# Patient Record
Sex: Female | Born: 1955
Health system: Southern US, Community
[De-identification: ages and names within clinical notes are randomized; demographics above are authoritative.]

## PROBLEM LIST (undated history)

## (undated) DIAGNOSIS — I10 Essential (primary) hypertension: Secondary | ICD-10-CM

## (undated) DIAGNOSIS — I2510 Atherosclerotic heart disease of native coronary artery without angina pectoris: Secondary | ICD-10-CM

## (undated) DIAGNOSIS — IMO0002 Reserved for concepts with insufficient information to code with codable children: Secondary | ICD-10-CM

## (undated) DIAGNOSIS — M797 Fibromyalgia: Secondary | ICD-10-CM

## (undated) DIAGNOSIS — F419 Anxiety disorder, unspecified: Secondary | ICD-10-CM

## (undated) DIAGNOSIS — D649 Anemia, unspecified: Secondary | ICD-10-CM

## (undated) DIAGNOSIS — K922 Gastrointestinal hemorrhage, unspecified: Secondary | ICD-10-CM

## (undated) DIAGNOSIS — R51 Headache: Secondary | ICD-10-CM

## (undated) DIAGNOSIS — R011 Cardiac murmur, unspecified: Secondary | ICD-10-CM

## (undated) DIAGNOSIS — M1711 Unilateral primary osteoarthritis, right knee: Secondary | ICD-10-CM

## (undated) HISTORY — DX: Reserved for concepts with insufficient information to code with codable children: IMO0002

## (undated) HISTORY — PX: TUBAL LIGATION: SHX77

## (undated) HISTORY — PX: ABDOMINOPLASTY: SUR9

## (undated) HISTORY — DX: Essential (primary) hypertension: I10

## (undated) HISTORY — PX: REDUCTION MAMMAPLASTY: SUR839

## (undated) HISTORY — DX: Headache: R51

---

## 1979-02-11 HISTORY — PX: CHOLECYSTECTOMY: SHX55

## 1979-02-11 HISTORY — PX: GALLBLADDER SURGERY: SHX652

## 1998-02-10 HISTORY — PX: CARDIAC SURGERY: SHX584

## 1998-10-30 ENCOUNTER — Encounter: Payer: Self-pay | Admitting: *Deleted

## 1998-10-30 ENCOUNTER — Ambulatory Visit (HOSPITAL_COMMUNITY): Admission: RE | Admit: 1998-10-30 | Discharge: 1998-10-30 | Payer: Self-pay | Admitting: *Deleted

## 1998-11-15 ENCOUNTER — Encounter: Payer: Self-pay | Admitting: Cardiothoracic Surgery

## 1998-11-19 ENCOUNTER — Encounter: Payer: Self-pay | Admitting: Cardiothoracic Surgery

## 1998-11-19 ENCOUNTER — Inpatient Hospital Stay (HOSPITAL_COMMUNITY): Admission: RE | Admit: 1998-11-19 | Discharge: 1998-11-23 | Payer: Self-pay | Admitting: Cardiothoracic Surgery

## 1998-11-19 HISTORY — PX: CARDIAC SURGERY: SHX584

## 1998-11-20 ENCOUNTER — Encounter: Payer: Self-pay | Admitting: Cardiothoracic Surgery

## 1998-11-21 ENCOUNTER — Encounter: Payer: Self-pay | Admitting: Cardiothoracic Surgery

## 1998-12-07 ENCOUNTER — Encounter: Admission: RE | Admit: 1998-12-07 | Discharge: 1998-12-07 | Payer: Self-pay | Admitting: Cardiothoracic Surgery

## 1998-12-07 ENCOUNTER — Encounter: Payer: Self-pay | Admitting: Cardiothoracic Surgery

## 1998-12-18 ENCOUNTER — Encounter (HOSPITAL_COMMUNITY): Admission: RE | Admit: 1998-12-18 | Discharge: 1999-03-18 | Payer: Self-pay | Admitting: Interventional Cardiology

## 1999-01-07 ENCOUNTER — Encounter: Payer: Self-pay | Admitting: Emergency Medicine

## 1999-01-07 ENCOUNTER — Inpatient Hospital Stay (HOSPITAL_COMMUNITY): Admission: EM | Admit: 1999-01-07 | Discharge: 1999-01-08 | Payer: Self-pay | Admitting: Emergency Medicine

## 1999-01-08 ENCOUNTER — Emergency Department (HOSPITAL_COMMUNITY): Admission: EM | Admit: 1999-01-08 | Discharge: 1999-01-08 | Payer: Self-pay | Admitting: *Deleted

## 2000-01-09 ENCOUNTER — Other Ambulatory Visit: Admission: RE | Admit: 2000-01-09 | Discharge: 2000-01-09 | Payer: Self-pay | Admitting: Family Medicine

## 2001-07-29 ENCOUNTER — Ambulatory Visit (HOSPITAL_COMMUNITY): Admission: RE | Admit: 2001-07-29 | Discharge: 2001-07-29 | Payer: Self-pay | Admitting: *Deleted

## 2002-02-10 HISTORY — PX: BLADDER SURGERY: SHX569

## 2002-12-08 ENCOUNTER — Encounter: Admission: RE | Admit: 2002-12-08 | Discharge: 2002-12-08 | Payer: Self-pay | Admitting: Family Medicine

## 2003-02-11 DIAGNOSIS — IMO0002 Reserved for concepts with insufficient information to code with codable children: Secondary | ICD-10-CM

## 2003-02-11 HISTORY — DX: Reserved for concepts with insufficient information to code with codable children: IMO0002

## 2003-07-27 ENCOUNTER — Other Ambulatory Visit: Admission: RE | Admit: 2003-07-27 | Discharge: 2003-07-27 | Payer: Self-pay | Admitting: Family Medicine

## 2003-10-31 ENCOUNTER — Encounter (INDEPENDENT_AMBULATORY_CARE_PROVIDER_SITE_OTHER): Payer: Self-pay | Admitting: *Deleted

## 2003-10-31 ENCOUNTER — Ambulatory Visit (HOSPITAL_COMMUNITY): Admission: RE | Admit: 2003-10-31 | Discharge: 2003-10-31 | Payer: Self-pay | Admitting: Urology

## 2004-01-29 ENCOUNTER — Other Ambulatory Visit: Admission: RE | Admit: 2004-01-29 | Discharge: 2004-01-29 | Payer: Self-pay | Admitting: Obstetrics and Gynecology

## 2004-03-27 ENCOUNTER — Ambulatory Visit (HOSPITAL_COMMUNITY): Admission: RE | Admit: 2004-03-27 | Discharge: 2004-03-27 | Payer: Self-pay | Admitting: Interventional Cardiology

## 2004-04-30 ENCOUNTER — Other Ambulatory Visit: Admission: RE | Admit: 2004-04-30 | Discharge: 2004-04-30 | Payer: Self-pay | Admitting: Obstetrics and Gynecology

## 2004-07-15 ENCOUNTER — Other Ambulatory Visit: Admission: RE | Admit: 2004-07-15 | Discharge: 2004-07-15 | Payer: Self-pay | Admitting: Obstetrics and Gynecology

## 2004-10-07 ENCOUNTER — Other Ambulatory Visit: Admission: RE | Admit: 2004-10-07 | Discharge: 2004-10-07 | Payer: Self-pay | Admitting: Obstetrics and Gynecology

## 2004-12-27 ENCOUNTER — Encounter (INDEPENDENT_AMBULATORY_CARE_PROVIDER_SITE_OTHER): Payer: Self-pay | Admitting: Specialist

## 2004-12-27 ENCOUNTER — Ambulatory Visit (HOSPITAL_COMMUNITY): Admission: RE | Admit: 2004-12-27 | Discharge: 2004-12-27 | Payer: Self-pay | Admitting: Obstetrics and Gynecology

## 2005-01-13 ENCOUNTER — Other Ambulatory Visit: Admission: RE | Admit: 2005-01-13 | Discharge: 2005-01-13 | Payer: Self-pay | Admitting: Obstetrics and Gynecology

## 2005-03-09 ENCOUNTER — Encounter: Admission: RE | Admit: 2005-03-09 | Discharge: 2005-03-09 | Payer: Self-pay | Admitting: Orthopaedic Surgery

## 2005-06-19 ENCOUNTER — Other Ambulatory Visit: Admission: RE | Admit: 2005-06-19 | Discharge: 2005-06-19 | Payer: Self-pay | Admitting: Obstetrics and Gynecology

## 2005-06-19 DIAGNOSIS — R87619 Unspecified abnormal cytological findings in specimens from cervix uteri: Secondary | ICD-10-CM

## 2005-06-19 DIAGNOSIS — IMO0002 Reserved for concepts with insufficient information to code with codable children: Secondary | ICD-10-CM

## 2005-06-19 HISTORY — DX: Unspecified abnormal cytological findings in specimens from cervix uteri: R87.619

## 2005-06-19 HISTORY — DX: Reserved for concepts with insufficient information to code with codable children: IMO0002

## 2005-07-18 ENCOUNTER — Encounter (INDEPENDENT_AMBULATORY_CARE_PROVIDER_SITE_OTHER): Payer: Self-pay | Admitting: Specialist

## 2005-07-18 ENCOUNTER — Ambulatory Visit (HOSPITAL_COMMUNITY): Admission: RE | Admit: 2005-07-18 | Discharge: 2005-07-18 | Payer: Self-pay | Admitting: Obstetrics and Gynecology

## 2005-07-18 HISTORY — PX: NOVASURE ABLATION: SHX5394

## 2005-10-21 ENCOUNTER — Other Ambulatory Visit: Admission: RE | Admit: 2005-10-21 | Discharge: 2005-10-21 | Payer: Self-pay | Admitting: Obstetrics and Gynecology

## 2006-10-16 ENCOUNTER — Encounter: Admission: RE | Admit: 2006-10-16 | Discharge: 2006-10-16 | Payer: Self-pay | Admitting: Family Medicine

## 2007-01-27 ENCOUNTER — Emergency Department (HOSPITAL_COMMUNITY): Admission: EM | Admit: 2007-01-27 | Discharge: 2007-01-27 | Payer: Self-pay | Admitting: Emergency Medicine

## 2007-02-17 ENCOUNTER — Encounter: Admission: RE | Admit: 2007-02-17 | Discharge: 2007-02-17 | Payer: Self-pay | Admitting: Obstetrics and Gynecology

## 2007-12-13 ENCOUNTER — Encounter: Admission: RE | Admit: 2007-12-13 | Discharge: 2007-12-13 | Payer: Self-pay | Admitting: Obstetrics and Gynecology

## 2008-07-20 ENCOUNTER — Encounter: Admission: RE | Admit: 2008-07-20 | Discharge: 2008-07-20 | Payer: Self-pay | Admitting: Obstetrics and Gynecology

## 2009-02-13 ENCOUNTER — Ambulatory Visit (HOSPITAL_BASED_OUTPATIENT_CLINIC_OR_DEPARTMENT_OTHER): Admission: RE | Admit: 2009-02-13 | Discharge: 2009-02-13 | Payer: Self-pay | Admitting: Surgery

## 2009-07-17 ENCOUNTER — Encounter: Admission: RE | Admit: 2009-07-17 | Discharge: 2009-07-17 | Payer: Self-pay | Admitting: Obstetrics and Gynecology

## 2009-11-18 ENCOUNTER — Emergency Department (HOSPITAL_COMMUNITY): Admission: EM | Admit: 2009-11-18 | Discharge: 2009-11-18 | Payer: Self-pay | Admitting: Emergency Medicine

## 2009-11-18 ENCOUNTER — Emergency Department (HOSPITAL_COMMUNITY): Admission: EM | Admit: 2009-11-18 | Discharge: 2009-11-19 | Payer: Self-pay | Admitting: Emergency Medicine

## 2009-12-10 ENCOUNTER — Encounter: Admission: RE | Admit: 2009-12-10 | Discharge: 2009-12-10 | Payer: Self-pay | Admitting: Gastroenterology

## 2009-12-11 ENCOUNTER — Encounter: Admission: RE | Admit: 2009-12-11 | Discharge: 2009-12-11 | Payer: Self-pay | Admitting: Gastroenterology

## 2009-12-14 ENCOUNTER — Encounter: Admission: RE | Admit: 2009-12-14 | Discharge: 2009-12-14 | Payer: Self-pay | Admitting: Gastroenterology

## 2010-04-25 LAB — POCT CARDIAC MARKERS: Troponin i, poc: <0.05 ng/mL (ref 0.00–0.09)

## 2010-04-25 LAB — HEPATIC FUNCTION PANEL
ALT: 25 U/L (ref 0–35)
AST: 27 U/L (ref 0–37)
Albumin: 3.8 g/dL (ref 3.5–5.2)
Alkaline Phosphatase: 66 U/L (ref 39–117)
Total Bilirubin: 0.7 mg/dL (ref 0.3–1.2)

## 2010-04-25 LAB — POCT I-STAT, CHEM 8
BUN: 12 mg/dL (ref 6–23)
Creatinine, Ser: 0.8 mg/dL (ref 0.4–1.2)
Potassium: 4 mEq/L (ref 3.5–5.1)
Sodium: 138 mEq/L (ref 135–145)

## 2010-04-25 LAB — CBC
Hemoglobin: 14.5 g/dL (ref 12.0–15.0)
MCV: 86.8 fL (ref 78.0–100.0)
Platelets: 228 10*3/uL (ref 150–400)
RBC: 4.79 MIL/uL (ref 3.87–5.11)
WBC: 11.8 10*3/uL — ABNORMAL HIGH (ref 4.0–10.5)

## 2010-04-25 LAB — DIFFERENTIAL
Lymphocytes Relative: 33 % (ref 12–46)
Lymphs Abs: 3.9 10*3/uL (ref 0.7–4.0)
Neutrophils Relative %: 59 % (ref 43–77)

## 2010-04-28 LAB — POCT HEMOGLOBIN-HEMACUE: Hemoglobin: 14.9 g/dL (ref 12.0–15.0)

## 2010-05-08 ENCOUNTER — Ambulatory Visit: Payer: 59 | Attending: Family Medicine | Admitting: Physical Therapy

## 2010-05-08 DIAGNOSIS — IMO0001 Reserved for inherently not codable concepts without codable children: Secondary | ICD-10-CM | POA: Insufficient documentation

## 2010-05-08 DIAGNOSIS — M25559 Pain in unspecified hip: Secondary | ICD-10-CM | POA: Insufficient documentation

## 2010-05-08 DIAGNOSIS — R262 Difficulty in walking, not elsewhere classified: Secondary | ICD-10-CM | POA: Insufficient documentation

## 2010-05-09 ENCOUNTER — Ambulatory Visit: Payer: 59 | Admitting: Physical Therapy

## 2010-05-13 ENCOUNTER — Ambulatory Visit: Payer: 59 | Attending: Family Medicine | Admitting: Physical Therapy

## 2010-05-13 DIAGNOSIS — IMO0001 Reserved for inherently not codable concepts without codable children: Secondary | ICD-10-CM | POA: Insufficient documentation

## 2010-05-13 DIAGNOSIS — M25559 Pain in unspecified hip: Secondary | ICD-10-CM | POA: Insufficient documentation

## 2010-05-13 DIAGNOSIS — R262 Difficulty in walking, not elsewhere classified: Secondary | ICD-10-CM | POA: Insufficient documentation

## 2010-05-15 ENCOUNTER — Ambulatory Visit: Payer: 59

## 2010-05-21 ENCOUNTER — Ambulatory Visit: Payer: 59 | Admitting: Physical Therapy

## 2010-05-23 ENCOUNTER — Encounter: Payer: 59 | Admitting: Physical Therapy

## 2010-05-23 ENCOUNTER — Ambulatory Visit: Payer: 59 | Admitting: Physical Therapy

## 2010-05-29 ENCOUNTER — Ambulatory Visit: Payer: 59 | Admitting: Physical Therapy

## 2010-06-03 ENCOUNTER — Ambulatory Visit: Payer: 59 | Admitting: Physical Therapy

## 2010-06-05 ENCOUNTER — Encounter: Payer: 59 | Admitting: Physical Therapy

## 2010-06-07 ENCOUNTER — Encounter (HOSPITAL_BASED_OUTPATIENT_CLINIC_OR_DEPARTMENT_OTHER)
Admission: RE | Admit: 2010-06-07 | Discharge: 2010-06-07 | Disposition: A | Discharge status: Home / Self Care | Payer: 59 | Source: Ambulatory Visit | Attending: Surgery | Admitting: Surgery

## 2010-06-07 LAB — CBC
HCT: 41.6 % (ref 36.0–46.0)
Hemoglobin: 14.4 g/dL (ref 12.0–15.0)
RDW: 13.2 % (ref 11.5–15.5)
WBC: 7.4 10*3/uL (ref 4.0–10.5)

## 2010-06-07 LAB — URINALYSIS, ROUTINE W REFLEX MICROSCOPIC
Ketones, ur: NEGATIVE mg/dL
Nitrite: NEGATIVE
Urobilinogen, UA: 0.2 mg/dL (ref 0.0–1.0)
pH: 5.5 (ref 5.0–8.0)

## 2010-06-07 LAB — DIFFERENTIAL
Basophils Absolute: 0 10*3/uL (ref 0.0–0.1)
Eosinophils Relative: 3 % (ref 0–5)
Lymphocytes Relative: 33 % (ref 12–46)
Neutro Abs: 4.3 10*3/uL (ref 1.7–7.7)

## 2010-06-07 LAB — PROTIME-INR: INR: 0.89 (ref 0.00–1.49)

## 2010-06-07 LAB — BASIC METABOLIC PANEL
CO2: 27 mEq/L (ref 19–32)
Calcium: 9.2 mg/dL (ref 8.4–10.5)
Creatinine, Ser: 0.71 mg/dL (ref 0.4–1.2)
Glucose, Bld: 105 mg/dL — ABNORMAL HIGH (ref 70–99)

## 2010-06-07 LAB — URINE MICROSCOPIC-ADD ON

## 2010-06-11 ENCOUNTER — Ambulatory Visit (HOSPITAL_BASED_OUTPATIENT_CLINIC_OR_DEPARTMENT_OTHER)
Admission: RE | Admit: 2010-06-11 | Discharge: 2010-06-11 | Disposition: A | Discharge status: Home / Self Care | Payer: 59 | Source: Ambulatory Visit | Attending: Surgery | Admitting: Surgery

## 2010-06-11 ENCOUNTER — Other Ambulatory Visit: Payer: Self-pay | Admitting: Surgery

## 2010-06-11 DIAGNOSIS — Z01812 Encounter for preprocedural laboratory examination: Secondary | ICD-10-CM | POA: Insufficient documentation

## 2010-06-11 DIAGNOSIS — L732 Hidradenitis suppurativa: Secondary | ICD-10-CM | POA: Insufficient documentation

## 2010-06-11 HISTORY — PX: CYSTECTOMY: SUR359

## 2010-06-15 NOTE — Op Note (Signed)
  NAME:  Sierra Lucas, Sierra Lucas NO.:  [id number]  MEDICAL RECORD NO.:  [id number]           PATIENT TYPE:  LOCATION:                                 FACILITY:  PHYSICIAN:  Velora Heckler, MD      DATE OF BIRTH:  06/08/1955  DATE OF PROCEDURE:  06/11/2010                               OPERATIVE REPORT   PREOPERATIVE DIAGNOSIS:  Hidradenitis, left medial buttock.  POSTOPERATIVE DIAGNOSIS:  Hidradenitis, left medial buttock.  PROCEDURE:  Excision, hidradenitis, left buttock (2.0 x 2.0 cm).  SURGEON:  Velora Heckler, MD, FACS  ANESTHESIA:  General.  ESTIMATED BLOOD LOSS:  Minimal.  PREPARATION:  Betadine.  COMPLICATIONS:  None.  INDICATIONS:  The patient is a 55 year old white female who had previous excision of hidradenitis from the medial left buttock in January 2011. This initially healed by secondary intention.  The patient was symptom free for approximately 1 year.  Over the past 2 months, she has developed recurrent drainage from the site.  She now comes to the operating room for excision.  BODY OF REPORT:  Procedure was done in OR #3 at the [facility].  The patient was brought to the operating room and placed in a supine position on the operating room table.  Following administration of general anesthesia, the patient was placed on lithotomy and then prepped and draped in the usual aseptic fashion.  After ascertaining that an adequate level of anesthesia had been achieved, the small sinus tract in the left medial buttock was probed with a blunt-tipped probe. It tracked only approximately 5-7 mm.  This did not appear to be a fistula in ano.  Using the electrocautery for hemostasis, an elliptical incision was made so as to encompass the old scar and sinus tract.  Dissection was carried into the subcutaneous tissues.  No purulent fluid or abscess cavity was identified.  Specimen was completely excised.  It measured approximately 2 x 2 cm.  It  was cut open on the back table and both aerobic and anaerobic cultures were obtained from the tissue and submitted to the laboratory for review.  The entire specimen was submitted to Pathology.  Wound was cleansed with Betadine.  Subcutaneous tissues were reapproximated with interrupted 3-0 Vicryl sutures.  Skin was closed with stainless steel staples.  Topical antibiotic ointment and dry gauze dressings were applied.  The patient was awakened from anesthesia and brought to the recovery room.  The patient tolerated the procedure well.   Velora Heckler, MD, FACS     TMG/MEDQ  D:  06/11/2010  T:  06/11/2010  Job:  [id number]  cc:   Pam Drown, M.D.  Electronically Signed by Darnell Level MD on 06/15/2010 12:39:28 PM

## 2010-06-16 LAB — ANAEROBIC CULTURE

## 2010-06-28 NOTE — Op Note (Signed)
NAME:  Sierra Lucas, Sierra Lucas               ACCOUNT NO.:  [id number]   MEDICAL RECORD NO.:  [id number]          PATIENT TYPE:  AMB   LOCATION:  DAY                          FACILITY:  [facility]   PHYSICIAN:  Hal Morales, M.D.DATE OF BIRTH:  25-Aug-1955   DATE OF PROCEDURE:  10/31/2003  DATE OF DISCHARGE:                                 OPERATIVE REPORT   PREOPERATIVE DIAGNOSES:  Abnormal genital cytology.   POSTOPERATIVE DIAGNOSES:  Abnormal genital cytology.   OPERATION:  Cold knife conization of the cervix.   ANESTHESIA:  General oral tracheal.   ESTIMATED BLOOD LOSS:  Less than 10 mL.   COMPLICATIONS:  None.   FINDINGS:  There were no exocervical lesions noted at the time of the  procedure.   DESCRIPTION OF PROCEDURE:  The patient was in the lithotomy position in the  operating room and fully anesthetized having undergone a tension free  vaginal tape for stress urinary incontinence by Dr. Marcelyn Bruins. The  patient was already in the lithotomy position and remained prepped and  draped.  A weighted speculum was placed in the posterior vagina and the  cervix infiltrated with a dilute Pitressin solution. Anchor sutures were  placed at the 3 and 9 o'clock positions and a cone shaped specimen of tissue  excised from the cervix including the endocervix after the life of the  endocervix had been measured. A suture was placed at the 12 o'clock position  on the specimen and once that cone shaped specimen had been excised it was  removed from the operative field.  The endocervix was then curetted sharply  and sent as a separate specimen. The endometrium was then curetted and sent  as a separate specimen. The cone bed was closed with Sturmdorf sutures at  the 12 and 6 o'clock positions and a portion of Gelfoam placed in the  conization bed with adequate hemostasis.  All instruments were removed from  the vagina and the patient awakened from general anesthesia and taken to the  recovery room  in satisfactory condition having tolerated the procedure well  with sponge and instrument counts correct.  Specimens to pathology, cold  knife conization, endocervical curettage, endometrial curettage.      VPH/MEDQ  D:  10/31/2003  T:  10/31/2003  Job:  [id number]

## 2010-06-28 NOTE — Op Note (Signed)
Sierra Lucas, Sierra Lucas               ACCOUNT NO.:  [id number]   MEDICAL RECORD NO.:  [id number]          PATIENT TYPE:  OIB   LOCATION:  2899                         FACILITY:  [facility]   PHYSICIAN:  Armanda Magic, M.D.     DATE OF BIRTH:  05-27-1955   DATE OF PROCEDURE:  03/27/2004  DATE OF DISCHARGE:  03/27/2004                                 OPERATIVE REPORT   PROCEDURE:  Tilt table test.   OPERATOR:  Armanda Magic, M.D.   INDICATIONS:  Syncope.   COMPLICATIONS:  None.   IV MEDICATIONS:  Isuprel drip.   This is a very pleasant 55 year old white female with a history of chest  pain dating back to 2000.  This ultimately led to a cardiac catheterization  which revealed an ostial LAD lesion by intravascular ultrasound of less than  70%.  Because of its location, it ws felt not to be a lesion that could be  easily treated by PCI, and therefore the patient underwent coronary artery  bypass grafting with a LIMA to the LAD.  This was done in 2000.  She  continued to have chest discomfort, though, postprocedure, and approximately  two months after her surgery had another catheterization done which showed  diminished flow through the LIMA with a more widely patent LAD, with good  runoff of the LAD.  There appeared to be less than 50% LAD stenosis.  Subsequently, she has been managed medically for the possibility of coronary  artery vasospasm.  She recently presented to Dr. Katrinka Blazing with several episodes  of syncope which now have become almost on a daily basis.  She says all of a  sudden she will feel like she is going to pass out.  The room will get dark.  She will get mild nausea, and then she will pass out.  She had one episode  while standing and viewing an apartment with her daughter.  Another episode  occurred while she was going to a wrestling match with one of her children  and attempted to get out of the car.  She felt faint and passed out.  She  has never had an episode while she has  been sitting down.  She does get a  significant enough prodrome that would allow her to be able to be able to  pull off of the road if she was getting an episode.  She does have an  adolescent history of fainting during menstrual cycles.   The patient was brought to the cardiac catheterization in the fasting  nonsedated state.  Informed consent was obtained.  The patient was connected  to continuous heart rate and pulse oximetry monitoring and intermittent  blood pressure monitoring.  The patient was placed supine for five minutes,  while baseline blood pressure was measured.  Average blood pressure while  supine was 132/68, with a heart rate of 88.  The lowest blood pressure  during supine blood pressure was 123/73, with a heart rate in the 80s.  The  patient was subsequently tilted upright to 70 degrees for a total of 30  minutes.  She was completely asymptomatic during the upright tilt.  Her  lowest blood pressure noted was 109/76, with a pulse of 98.  The patient was  then placed supine, and because she did not have any symptoms during the 30  minute upright tilt, it was decided to start her on low-dose Isuprel to see  if we could bring out any symptoms of presyncope or syncope.  She was  started on Isuprel at 0.5 mcg/minute, and this was titrated up to have an  increase in her heart rate of 20% above her baseline heart rate.  At the  time her heart rate got to 103 beats per minute, she was subsequently tilted  back up to 70 degrees.  Her lowest blood pressure achieved on upright tilt  was 128/55.  Heart rate at that time was 137 beats per minute.  Heart rate  got as high as 145 beats per minute, and the patient was feeling agitated on  the Isuprel, so therefore the Isuprel was discontinued.  She was tilted  upright for only 10 minutes on the Isuprel.  After the procedure was  completed and the Isuprel drip was discontinued, blood pressure was back to  baseline at 145/60, with a heart  rate of 87.   ASSESSMENT:  1.  Multiple episodes of presyncope and syncope.  2.  History of nonobstructive coronary disease, with most recent      catheterization showing less than 50% left anterior descending stenosis.  3.  Negative tilt table test for syncope.   PLAN:  I have discussed the patient's results with Dr. Katrinka Blazing.  I am concern  about the frequency of these episodes, and they all do seem consistent with  vasovagal syncope.  She is already on Toprol, and I have recommended that we  start her on Zoloft 50 mg daily as well for possible vasovagal syncope.  Dr.  Katrinka Blazing is in agreement, and I have given her a prescription for Zoloft 50 mg  daily.  She is to continue on her Toprol, and he recently increased to 50 mg  daily.  She is going to be discharged home today, and she will follow up  with Dr. Katrinka Blazing next Monday April 01, 2004.      TT/MEDQ  D:  03/29/2004  T:  03/29/2004  Job:  [id number]   cc:   Lyn Records III, M.D.  [address]  [place]  [place] [address]  Fax: [phone]   Pam Drown, M.D.  [address]  [place]  [place] [address]  Fax: [phone]   Dellis Anes. Idell Pickles, M.D.  [address]  [place]  [place] [address]  Fax: [phone]

## 2010-06-28 NOTE — Discharge Summary (Signed)
[facility]  Patient:    Sierra Lucas                       MRN: [id number] Adm. Date:  [id number] Disc. Date: 11/23/98 Attending:  Lyn Records. Iii Dictator:   Luretha Rued. Ezzard Standing, P.A. CC:         Mikey Bussing, M.D.                           Discharge Summary  DATE OF SURGERY:  November 19, 1998  DATE OF BIRTH:  Jun 05, 1955  ADMISSION DIAGNOSES: 1. Coronary artery disease. 2. Obesity. 3. Positive family medical history of coronary artery disease. 4. Sulfa allergy. 5. Status post cholecystectomy. 6. History of gastroesophageal reflux disease.  DISCHARGE DIAGNOSES: 1. Coronary artery disease, now status post coronary artery bypass grafting x 1    with the placement of a left internal mammary artery to the left anterior    descending. 2. Obesity. 3. Positive family medical history of coronary artery disease. 4. Sulfa allergy. 5. Status post cholecystectomy. 6. Gastroesophageal reflux disease.  ALLERGIES:  SULFA.  HISTORY OF PRESENT ILLNESS:  Mrs. Sierra Lucas is a 55 year old obese white female patient with a positive family history of heart disease who presented on October 14, 1998, after approximately one month of crushing chest pain radiating to her eft shoulder accompanied by dyspnea on exertion and diaphoresis.  She underwent a Cardiolite test performed on October 30, 1998, with positive anterior wall ischemia.  Cardiac catheterization was performed that demonstrated an area of stenosis at the origin of the LAD.  She is now being admitted for coronary artery bypass grafting.  HOSPITAL COURSE:  Mrs. Sierra Lucas was admitted to the hospital on November 19, 1998, nd underwent coronary artery bypass grafting x 1, with the placement of a left internal mammary artery to left anterior descending by Dr. Kathlee Nations Trigt. She tolerated this without difficulty, and was transferred to the SICU.  Her initial visit in the SICU  revealed sinus rhythm and cardiac index greater than 2.2. The following morning, she was extubated and started on her beta blocker, transferred to Unit 2000 without further complications.  She was followed throughout her hospital course by her cardiologist, Dr. Garnette Scheuermann, and on postoperative day #3, she continued to progress.  She was ambulating without difficulty.  Her course ad only been complicated by some transient nausea, and her increased WBC count had  resolved.  The following day, she had continued to improve and was ambulating without difficulty, and was discharged from the hospital.  DISCHARGE INSTRUCTIONS:  No heavy lifting, no strenuous activity, no driving of an automobile.  She should keep her wounds clean and dry.  She may shower as required.  DISCHARGE MEDICATIONS: 1. Lopressor 25 mg p.o. b.i.d. 2. Enteric-coated aspirin 325 mg p.o. q.d. 3. Lasix 40 mg p.o. q.d. x 5 days. 4. K-Dur 20 mEq q.d. x 5 days. 5. Darvocet-N 100 1-2 tablets p.o. every four to six hours p.r.n. pain.  FOLLOW-UP:  She is to follow up with Dr. Foye Deer and Dr. Candace Cruise as needed. She is to follow up with Dr. Verdis Prime in two weeks.  She is to follow up with Dr. Donata Clay in three weeks with a chest x-ray. DD:  01/01/99 TD:  01/01/99 Job: [id number]

## 2010-06-28 NOTE — H&P (Signed)
NAME:  Sierra Lucas              ACCOUNT NO.:  [id number]   MEDICAL RECORD NO.:  [id number]           Lucas TYPE:   LOCATION:                                 FACILITY:   PHYSICIAN:  Hal Morales, M.D.DATE OF BIRTH:  23-Nov-1955   DATE OF ADMISSION:  DATE OF DISCHARGE:                                HISTORY & PHYSICAL   HISTORY OF PRESENT ILLNESS:  Sierra Lucas is a 55 year old, married, white  female who is status post bilateral tubal ligation, para 2-0-2-2, who  presents for hysteroscopy with D&C because of prolonged menses.  For Sierra  past year, Sierra Lucas bleeds monthly from 10-14 days.  During this time she  wears a super tampon with a pad which she changes every two to four hours.  She also experiences cramping which she rates as a 6/10 on a 10-point scale  but finds relief with ibuprofen 400 mg as prescribed.  She denies any  urinary tract symptoms, change in her bowel habits, fever, vaginitis  symptoms or dyspareunia.  Sierra Lucas desires to proceed with hysteroscopy  with D&C, evaluation and management of her symptoms.   OB HISTORY:  Gravida 4, para 2-0-2-2.  Sierra Lucas experienced preterm labor  and she has RH negative blood.   GYN HISTORY:  Menarche at 55 years old.  Last menstrual period was December 04, 2004. She uses bilateral tubal ligation as a method of contraception.  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  Sierra Lucas's Pap  smears since that time have been normal with her last normal Pap smear being  August of 2006.  Her last normal mammogram was May 2005.   PAST MEDICAL HISTORY:  1.  Heart disease.  2.  Migraines.  3.  Hypertension.  4.  Hyperlipidemia.  5.  Neurally mediated syncope.   PAST SURGICAL HISTORY:  1.  1981 cholecystectomy.  2.  1988 bilateral tubal ligation.  3.  2000 coronary artery bypass graft.  4.  2001 sinus surgery.  5.  2005 placement of a transobturator sling.  6.  2006  abdominoplasty.  7.  She denies  any history of transfusion but she does admit to having      severe nausea and vomiting post anesthesia.   FAMILY HISTORY:  Positive for coronary artery disease.   SOCIAL HISTORY:  Sierra Lucas is married and she works as an Airline pilot.   HABITS:  She rarely consumes alcohol except on special occasions.  Does not  use tobacco.   CURRENT MEDICATIONS:  1.  Zegerid 40 mg daily.  2.  Cefuroxime 250 mg twice daily for 10 days for a sinus infection (started      December 18, 2004).   ALLERGIES:  SULFA which causes hives.  She is also allergic to LATEX which  causes itching.   REVIEW OF SYSTEMS:  Sierra Lucas has dry skin.  She wears reading glasses.  Otherwise review of systems is negative except as mentioned in history of  present illness.   PHYSICAL EXAMINATION:  VITAL SIGNS:  Blood pressure 138/82, weight 222  pounds,  height 5 feet 4 inches tall.  NECK:  Supple without masses.  There is no thyromegaly or adenopathy.  HEART:  Regular rate and rhythm.  There is a 2/6 systolic ejection murmur at  Sierra left sternal border without radiation.  LUNGS:  Clear to auscultation.  There are no wheezes, rales or rhonchi.  BACK:  No CVA tenderness.  ABDOMEN:  Bowel sounds are present.  Abdomen soft.  Sierra Lucas does have a  healing abdominal incision from her abdominoplasty (September 2006).  There  is no hepatosplenomegaly.  EXTREMITIES:  Without clubbing, cyanosis or edema.  PELVIC:  EGBUS is within normal limits. Vagina is rugose.  Cervix is  nontender without lesions.  Uterus appears upper limits of normal size  without tenderness.  Adnexa without tenderness or masses.  Rectovaginal  without tenderness or masses.   IMPRESSION:  Prolonged menses.   DISPOSITION:  Discussion was held with Sierra Lucas regarding Sierra indications  for her procedure along with its risks which include but are not limited to  reaction to anesthesia, damage to adjacent organs, infection, and excessive  bleeding.  Sierra  Lucas has consented to proceed with hysteroscopy with D&C  with Sierra possibility of fibroid or polyp resection at [facility]  [place] on December 27, 2004 at 11:45 a.m.      [place] J. Adline Peals.      Hal Morales, M.D.  Electronically Signed    EJP/MEDQ  D:  12/25/2004  T:  12/25/2004  Job:  [id number]

## 2010-06-28 NOTE — Op Note (Signed)
NAME:  Sierra Lucas, Sierra Lucas               ACCOUNT NO.:  [id number]   MEDICAL RECORD NO.:  [id number]          PATIENT TYPE:  AMB   LOCATION:  SDC                           FACILITY:  [facility]   PHYSICIAN:  Hal Morales, M.D.DATE OF BIRTH:  [birth year]   DATE OF PROCEDURE:  DATE OF DISCHARGE:                                 OPERATIVE REPORT   PREOPERATIVE DIAGNOSES:  1.  Menorrhagia.  2.  Thickened endometrium on ultrasound suggestive of endometrial polyp.   POSTOPERATIVE DIAGNOSES:  1.  Menorrhagia.  2.  No specific endometrial lesion noted   PROCEDURE:  Diagnostic hysteroscopy, diagnostic dilatation and curettage,  NovaSure endometrial ablation.   SURGEON:  Hal Morales, M.D.   ANESTHESIA:  General LMA.   SPECIMENS TO PATHOLOGY:  Endometrial curettings.   ESTIMATED BLOOD LOSS:  Less than 50 mL.   COMPLICATIONS:  Malfunction of hysteroscopic equipment preventing distension  of the uterus until the uterus could be relaxed with Toradol.   DESCRIPTION OF PROCEDURE:  The patient was taken to the operating room after  appropriate identification and placed on the operating table.  After the  attainment of adequate anesthesia, she was placed in the  lithotomy  position.  The perineum and vagina were prepped with multiple layers of  Betadine and a straight catheter used to empty the bladder.  Perineum was  draped in a sterile field.  A Graves speculum was placed in the vagina and a  single-tooth tenaculum placed on the anterior cervix.  The uterus sounded to  8 cm.  The cervix measured 2 cm.  The cervix was then dilated to #23 to  accommodate the diagnostic hysteroscope.  The hysteroscope was inserted, but  an adequate flow that allowed for distension of the uterus with lactated  Ringer's could never be achieved.  That hysteroscope was removed and a  different hysteroscope used that seemed to better match the cervical  dilation.  At that point, the uterus could visibly be seen  contracting  precluding the identification of the tubal ostia.  The endometrial cavity  was then curetted to decrease the thickness of the endometrium and thus  hopefully made visibility improved.  Even after curetting the uterus and  getting a moderate amount of tissue, the tubal ostia could not be  identified.  At that point the distension media was changed to sorbitol with  the hope that the heavier distension medium would allow better  visualization.  This still did not allow for adequate visualization, and at  that point the patient was given Toradol 30 mg IV and after a 8-10 minute  hiatus, the diagnostic hysteroscope was inserted utilizing sorbitol as the  distension media, and the endometrial cavity was able to be distended to  adequately identify the tubal ostia on either side.  At that point, the  endometrial cavity was rinsed with lactated Ringer's for total of 300 mL to  allow all of the sorbitol to be removed.  The NovaSure apparatus was then  set at the 6 cm endometrial cavity length and placed into the endometrial  cavity.  The NovaSure  array was then deployed and seated, this allowing the  cavity width to move from 3 cm to 4 cm with maximal seating of the array.  At that point, the collar was placed directly against the cervical os and  the integrity of the endometrial cavity tested.  The test for endometrial  cavity integrity was passed, and the endometrial ablation begun.  The  ablation went for 1 minute and 18 seconds at a power of 132.  The NovaSure  array was then retracted into the sheath and removed from the endometrial  cavity.  All instruments were then removed from the vagina, and a single  suture of 0  Vicryl was used to achieve hemostasis in the cervix at the site  of the tenaculum .  The patient was then awakened from general anesthesia  and taken to the recovery room in satisfactory condition having tolerated  the procedure well with sponge and instrument  counts correct.   SPECIMENS TO PATHOLOGY:  Endometrial curettings.      Hal Morales, M.D.  Electronically Signed     VPH/MEDQ  D:  07/18/2005  T:  07/19/2005  Job:  [id number]

## 2010-06-28 NOTE — H&P (Signed)
NAME:  Sierra Lucas, Sierra Lucas               ACCOUNT NO.:  [id number]   MEDICAL RECORD NO.:  [id number]          PATIENT TYPE:  AMB   LOCATION:  [facility]                           FACILITY:  [facility]   PHYSICIAN:  Hal Morales, M.D.DATE OF BIRTH:  Mar 28, 1955   DATE OF ADMISSION:  07/18/2005  DATE OF DISCHARGE:  07/18/2005                                HISTORY & PHYSICAL   HISTORY OF PRESENT ILLNESS:  The patient is a 55 year old white married  female, para 4-0-2-2, who presents for NovaSure endometrial ablation for  menorrhagia.  The patient has an almost 2-year history of heavy menstrual  bleeding.  She was originally treated with Femring for her menses, as well  as her perimenopausal symptoms after she had noted to get no relief from  Effexor.  The Femring did not help with her menorrhagia and she opted to  undergo hysteroscopy, D&C for evaluation of her endometrial cavity in  November 2006, but declined endometrial ablation at that time.  Subsequent  to her hysteroscopy and D&C, the pathology from that was noted to be  completely benign.  She continued to have difficulty with menorrhagia and in  May 2007, underwent a pelvic ultrasound showing thickened endometrium with  two hyperechoic masses, thought to be suggestive of a polyp.  She was also  noted to have two small uterine fibroids which were intramural.  She thus  presents for hysteroscopy, D&C for evaluation of the endometrium and  polypectomy if present.  She also, at this point, wants to proceed with  endometrial ablation.   PAST MEDICAL HISTORY:  1.  Heart disease.  2.  Migraines.  3.  Hypertension.  4.  Hyperlipidemia.  5.  Neurally mediated syncope.   PAST SURGICAL HISTORY:  In 1981, cholecystectomy; 1988, bilateral tubal  ligation; 2000, coronary artery bypass graft; 2001, sinus surgery; 2005,  placement of a transobturator sling; 2005, cold knife conization of the  cervix; 2006, abdominoplasty; November 2006, hysteroscopy, D&C.   The patient  denies any history of transfusion.   FAMILY HISTORY:  Positive for coronary artery disease.   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   CURRENT MEDICATIONS:  Prilosec 20 mg daily, Claritin one p.o. daily,  calcium, vitamin E, flax seed oil, folic acid.   DRUG SENSITIVITIES:  SULFA.   ENVIRONMENTAL SENSITIVITIES:  LATEX.   REVIEW OF SYSTEMS:  Positive for some skin drawing, though her thyroid  studies have been normal.  She does wear reading glasses.   PHYSICAL EXAMINATION:  GENERAL:  The patient is an obese white female in no  acute distress.  VITAL SIGNS:  Blood pressure 122/74.  Weight 205 pounds.  LUNGS:  Clear.  HEART:  Regular rate and rhythm.  ABDOMEN:  Soft without palpable masses or organomegaly.  There are well-  healed abdominal incisions.  PELVIC:  EGB within normal limits.  Vagina is rugous.  The cervix is without  gross lesions.  The uterus feels upper limits of normal size.  It is  anterior and mobile.  Adnexa:  No masses.   IMPRESSION:  1.  Menorrhagia which may very well be perimenopausal with normal pathology      in November, but a thickened endometrium on ultrasound suggestive of a      polyp.  2.  Intramural uterine fibroids.  3.  Complicated past medical history.   DISPOSITION:  Several discussions have been held with the patient concerning  options for her management which include endometrial ablation, IUD  placement, an attempt at hormonal control of bleeding, and hysterectomy.  The patient definitely declines hysterectomy and feels that hormonal  management has not worked in the past.  She is not interested in an IUD and  wishes to pursue endometrial ablation.  The risks of anesthesia, bleeding,  infection, damage to adjacent organs, and the specific risk of uterine  perforation have all been discussed with the patient.  It has also been  discussed with the patient that in the presence of an anatomic  abnormality  such as fibroids, endometrial ablation may not be as effective in solving  the problem of menorrhagia for her.  She acknowledges understanding and  wishes to proceed with diagnostic hysteroscopy, diagnostic D&C, possible  removal of polyps, and endometrial ablation at [facility] on July 18, 2005.      Hal Morales, M.D.  Electronically Signed     VPH/MEDQ  D:  07/22/2005  T:  07/22/2005  Job:  [id number]

## 2010-06-28 NOTE — Op Note (Signed)
NAME:  OCEAN, KEARLEY NO.:  [id number]   MEDICAL RECORD NO.:  [id number]          PATIENT TYPE:  OIB   LOCATION:                               FACILITY:  [facility]   PHYSICIAN:  Armanda Magic, M.D.     DATE OF BIRTH:  February 10, 1956   DATE OF PROCEDURE:  DATE OF DISCHARGE:                                 OPERATIVE REPORT   REFERRING PHYSICIAN:  Lyn Records, M.D.   PROCEDURE:  Tilt table test.   OPERATOR:  Armanda Magic, M.D.   INDICATIONS FOR PROCEDURE:  Syncope.   COMPLICATIONS:  None.   IV MEDICATIONS:  Isopril drip.   This is a 55 year old white female with a previous history of coronary  disease with a LIMA to the LAD back in 2004, repeat cath recently showed a  widely open   Dictation ends here.      TT/MEDQ  D:  03/27/2004  T:  03/27/2004  Job:  [id number]

## 2010-06-28 NOTE — Op Note (Signed)
NAME:  Sierra Lucas, Sierra Lucas               ACCOUNT NO.:  [id number]   MEDICAL RECORD NO.:  [id number]          PATIENT TYPE:  AMB   LOCATION:  DAY                          FACILITY:  [facility]   PHYSICIAN:  Jamison Neighbor, M.D.  DATE OF BIRTH:  [birth year]   DATE OF PROCEDURE:  10/31/2003  DATE OF DISCHARGE:                                 OPERATIVE REPORT   PREOPERATIVE DIAGNOSIS:  Stress urinary incontinence.   POSTOPERATIVE DIAGNOSIS:  Stress urinary incontinence.   PROCEDURE:  Cystoscopy and placement of transobturator sling.   SURGEON:  Jamison Neighbor, M.D.   ANESTHESIA:  General.   COMPLICATIONS:  None.   DRAINS:  Foley catheter removed in the recovery room.   HISTORY:  This 55 year old female has a mixed urinary incontinence.  The  patient has had stress incontinence for several years which has gotten  worse.  She has lost urine whenever she coughs, sneezes, laughs, gets up on  her feet too quickly.  She also has strong over reactive bladder symptoms  with urgency.  The patient underwent urodynamic evaluation which showed  funneling of the bladder and evidence of a positive weak point.  The patient  also had a positive Marshal test.  She is now to undergo placement of a  pubovaginal sling.  The patient is not incontinent when she is not active,  so this will be a sling under very minimal tension.  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  The patient  understands the risks and benefits of the procedure and gave full informed  consent.  She is scheduled to undergo cold knife conization by Dr. Dierdre Forth at this time during the same procedure.   DESCRIPTION OF PROCEDURE:  After successful induction of general anesthesia,  the patient was placed in a modified dorsal lithotomy position, prepped  with  Betadine, and draped in the usual sterile fashion.  The anterior vaginal  mucosa was infiltrated with local anesthetic.  It was clear that there was a  very modest cystocele and merely nothing that would require therapy.  The  patient had a modest rectocele.  There was no significant prolapse of the  uterus.  The anterior vaginal mucosa was infiltrated and then an incision  was made.  The incision went from the mid urethra posteriorly.  The flaps of  the mucosa were raised with care taken to make sure they were as thick as  possible.  This went back to but not through the endopelvic fascia.  Two  small stab incisions were made in the groin crease directly lateral to the  clitoris at approximately 5 mm.  This gave optimal entry into the  transobturator space.  The helical needle was then passed from the small  groin incision to the obturator fossa and into the vaginal opening on both  sides.  Care was taken to make sure that the fornix of the vagina was not  caught and the bladder was not injured in any way.  Cystoscopy was performed  with both 12 degree and 70 degree lenses.  There was no evidence of any  injury to the bladder.  The cystoscope was removed.  The sling was grasped  and placed in position.  The tension was set so that an instrument could be  passed between the urethra and the sling to avoid excessive tension.  The  sling was very flat in the way it laid on the urethra.  It appeared to be  appropriately at the mid urethral location.  The area was irrigated with  antibiotic solution.  Prior to closure, the cystoscope was inserted, the  bladder was filled, and there was no loss of urine but with a  Crede  maneuver, there was a straight prompt flow of urine which would suggest the  patient is not over corrected.  Once again, inspection showed that the tip  of the Mayo scissors could be passed between the urethra and the sling.  After additional irrigation, the incision was  closed with a running suture  of 3-0 Vicryl.  The sling was cut off at the skin level and Dermabond will  be applied to the two small skin incisions.  The patient tolerated this  portion of the procedure.  A Foley catheter was left in place for Dr.  Lilian Coma procedure and this will subsequently be removed in the recovery  room.  If the patient voids without difficulty, she will go home.  If she  has any difficulty voiding, a Foley catheter will be inserted and she will  return later on this week for a voiding trial.  The patient has been advised  to avoid any intercourse or heavy lifting but can return to work as soon as  she has no significant spotting and feels stable.      RJE/MEDQ  D:  10/31/2003  T:  10/31/2003  Job:  [id number]   cc:   Pam Drown, M.D.  [address]  [place]  [place] [address]  Fax: [phone]   Hal Morales, M.D.  Fax: [phone]

## 2010-06-28 NOTE — Op Note (Signed)
NAME:  Sierra Lucas, Sierra Lucas               ACCOUNT NO.:  [id number]   MEDICAL RECORD NO.:  [id number]          PATIENT TYPE:  AMB   LOCATION:  SDC                           [facility]ACILITY:  [facility]   PHYSICIAN:  Hal Morales, M.D.DATE OF BIRTH:  [birth year]   DATE OF PROCEDURE:  12/27/2004  DATE OF DISCHARGE:                                 OPERATIVE REPORT   PREOPERATIVE DIAGNOSIS:  Abnormal uterine bleeding.   POSTOPERATIVE DIAGNOSIS:  Abnormal uterine bleeding.   OPERATION:  Diagnostic hysteroscopy, diagnostic D&C.   SURGEON:  Hal Morales, M.D.   ANESTHESIA:  General orotracheal.   ESTIMATED BLOOD LOSS:  Less than 50 mL.   COMPLICATIONS:  None.   FINDINGS:  The uterus sounded to 9 cm. There were normal ostia bilaterally.  There were no polypoid or fibroid lesions in the endometrial cavity. The  endometrium was thickened and there was a large amount of endometrium  obtained at [facility].   PROCEDURE:  The patient was taken to the operating room after appropriate  identification and placed on the operating table. After the attainment of  adequate general anesthesia the patient was placed in the lithotomy  position. Perineum and vagina were prepped with multiple layers of Betadine  and draped in sterile field. A red Robinson catheter was used to empty the  bladder. A Graves speculum was placed in the vagina and a paracervical block  achieved with a total of 10 mL of 2% Xylocaine in the 5 and 7 o'clock  positions. The single-tooth tenaculum was placed on the anterior cervix and  the uterus sounded to 9 cm. The endometrial curettings were then obtained.  The cervix was then dilated to accommodate the diagnostic hysteroscope. The  hysteroscope was used to make the above-noted findings. The hysteroscope was  removed and a Randall stone forceps used to remove large pieces of  endometrium. Subsequently the endometrial cavity was curetted in all four  quadrants and those curettings removed from  the operative field. The  hysteroscope was reinserted and still no lesions noted. All instruments were  then removed from the vagina and the patient awakened from general  anesthesia and taken to the recovery room in satisfactory condition having  tolerated the procedure well with sponge and instrument counts correct.   SPECIMENS TO PATHOLOGY:  Endometrial curettings, endocervical curettings.     Hal Morales, M.D.  Electronically Signed    VPH/MEDQ  D:  12/27/2004  T:  12/27/2004  Job:  [id number]

## 2010-07-30 ENCOUNTER — Encounter (INDEPENDENT_AMBULATORY_CARE_PROVIDER_SITE_OTHER): Payer: Self-pay | Admitting: Surgery

## 2010-07-30 DIAGNOSIS — Z780 Asymptomatic menopausal state: Secondary | ICD-10-CM | POA: Insufficient documentation

## 2010-07-30 DIAGNOSIS — Z973 Presence of spectacles and contact lenses: Secondary | ICD-10-CM | POA: Insufficient documentation

## 2010-07-30 DIAGNOSIS — Z951 Presence of aortocoronary bypass graft: Secondary | ICD-10-CM

## 2010-07-30 DIAGNOSIS — Z803 Family history of malignant neoplasm of breast: Secondary | ICD-10-CM

## 2010-08-13 ENCOUNTER — Other Ambulatory Visit: Payer: Self-pay | Admitting: Family Medicine

## 2010-08-13 DIAGNOSIS — Z1231 Encounter for screening mammogram for malignant neoplasm of breast: Secondary | ICD-10-CM

## 2010-08-21 ENCOUNTER — Ambulatory Visit
Admission: RE | Admit: 2010-08-21 | Discharge: 2010-08-21 | Disposition: A | Discharge status: Home / Self Care | Payer: 59 | Source: Ambulatory Visit | Attending: Family Medicine | Admitting: Family Medicine

## 2010-08-21 DIAGNOSIS — Z1231 Encounter for screening mammogram for malignant neoplasm of breast: Secondary | ICD-10-CM

## 2010-11-15 LAB — POCT CARDIAC MARKERS
CKMB, poc: <1 — ABNORMAL LOW
CKMB, poc: <1 — ABNORMAL LOW
Myoglobin, poc: 33.3
Operator id: 198171
Troponin i, poc: <0.05
Troponin i, poc: <0.05

## 2010-11-15 LAB — CBC
Platelets: 226
RDW: 13.9

## 2010-11-15 LAB — I-STAT 8, (EC8 V) (CONVERTED LAB)
Chloride: 106
HCT: 45
Hemoglobin: 15.3 — ABNORMAL HIGH
Operator id: 198171
Potassium: 4.3
TCO2: 27
pCO2, Ven: 42.8 — ABNORMAL LOW

## 2010-11-15 LAB — DIFFERENTIAL
Basophils Absolute: 0
Eosinophils Relative: 4
Lymphocytes Relative: 27
Neutro Abs: 5.2

## 2010-11-15 LAB — POCT I-STAT CREATININE: Creatinine, Ser: 0.9

## 2011-02-11 HISTORY — PX: INGUINAL HIDRADENITIS EXCISION: SHX1827

## 2011-07-14 ENCOUNTER — Encounter: Payer: Self-pay | Admitting: Obstetrics and Gynecology

## 2011-07-16 ENCOUNTER — Ambulatory Visit: Payer: Self-pay | Admitting: Obstetrics and Gynecology

## 2011-08-20 ENCOUNTER — Other Ambulatory Visit: Payer: Self-pay | Admitting: Family Medicine

## 2011-08-20 DIAGNOSIS — Z1231 Encounter for screening mammogram for malignant neoplasm of breast: Secondary | ICD-10-CM

## 2011-09-09 ENCOUNTER — Ambulatory Visit: Payer: 59

## 2011-09-15 ENCOUNTER — Ambulatory Visit
Admission: RE | Admit: 2011-09-15 | Discharge: 2011-09-15 | Disposition: A | Discharge status: Home / Self Care | Payer: Commercial Indemnity | Source: Ambulatory Visit | Attending: Family Medicine | Admitting: Family Medicine

## 2011-09-15 DIAGNOSIS — Z1231 Encounter for screening mammogram for malignant neoplasm of breast: Secondary | ICD-10-CM

## 2011-10-01 ENCOUNTER — Ambulatory Visit (INDEPENDENT_AMBULATORY_CARE_PROVIDER_SITE_OTHER): Payer: Commercial Indemnity | Admitting: Obstetrics and Gynecology

## 2011-10-01 ENCOUNTER — Encounter: Payer: Self-pay | Admitting: Obstetrics and Gynecology

## 2011-10-01 VITALS — BP 122/70 | Ht 64.0 in | Wt 226.0 lb

## 2011-10-01 DIAGNOSIS — Z124 Encounter for screening for malignant neoplasm of cervix: Secondary | ICD-10-CM

## 2011-10-01 DIAGNOSIS — R1011 Right upper quadrant pain: Secondary | ICD-10-CM

## 2011-10-01 NOTE — Patient Instructions (Signed)
OTC Simethicone before each meal and at bedtime

## 2011-10-01 NOTE — Progress Notes (Signed)
Subjective:  AEX:  Last Pap: 07/15/2010 WNL: Yes hX OF ABNORMAL S/P CKC 2005, NL SINCE Regular Periods:no Contraception: BTL  Monthly Breast exam:no Tetanus<78yrs:yes Nl.Bladder Function:yes Daily BMs:yes Healthy Diet:yes Calcium:yes Mammogram:yes Date of Mammogram: 09/15/2011 Exercise:yes Have often Exercise: 5 times weekly Seatbelt: yes Abuse at home: no Stressful work:no Sigmoid-colonoscopy: 2011 Bone Density: Yes 10+ years ago PCP: Dr. Selena Batten Change in PMH: None Change in [facility]: None    Sierra Lucas is a 56 y.o. female [phone] who presents for annual exam.  No menopausal sx or vaginal bleeding   The following portions of the patient's history were reviewed and updated as appropriate: allergies, current medications, past family history, past medical history, past social history, past surgical history and problem list.  Review of Systems Pertinent items are noted in HPI. Gastrointestinal:No change in bowel habits, No rectal bleeding. Does have a 2 yr hx of RUQ pain worked up with GI consult, endoscopy and negative CT.  Pain occurs only while in the states, not while she is in [place] where she is living now and where she seldom eats out. Genitourinary:negative for dysuria, frequency, hematuria, nocturia and urinary incontinence    Objective:     BP 122/70  Ht 5\' 4"  (1.626 m)  Wt 226 lb (102.513 kg)  BMI 38.79 kg/m2  Weight:  Wt Readings from Last 1 Encounters:  10/01/11 226 lb (102.513 kg)     BMI: Body mass index is 38.79 kg/(m^2). General Appearance: Alert, appropriate appearance for age. No acute distress HEENT: Grossly normal Neck / Thyroid: Supple, no masses, nodes or enlargement Lungs: clear to auscultation bilaterally Back: No CVA tenderness Breast Exam: s/p reduction mammoplasty and No masses or nodes.No dimpling, nipple retraction or discharge. Cardiovascular: Regular rate and rhythm. S1, S2, no murmur Gastrointestinal: Soft, non-tender, no  masses or organomegaly Pelvic Exam: External genitalia: normal general appearance Vaginal: atrophic mucosa Cervix: no lesions, stenotic os Adnexa: non palpable Uterus: ULNS Rectovaginal: normal rectal, no masses Lymphatic Exam: Non-palpable nodes in neck, clavicular, axillary, or inguinal regions Skin: no rash or abnormalities Neurologic: Normal gait and speech, no tremor  Psychiatric: Alert and oriented, appropriate affect.    Urinalysis:Not done    Assessment:    Hx HGSIL, now with nl paps s/p CKC  RUQ pain, most consistent with functional disease by hx  Plan:   mammogram pap smear annually until 2030 Simethicone OTC ac and hs Follow-up:  for annual exam F/U with PCP re residual RUQ pain

## 2011-10-02 LAB — PAP IG W/ RFLX HPV ASCU

## 2011-10-06 ENCOUNTER — Encounter (INDEPENDENT_AMBULATORY_CARE_PROVIDER_SITE_OTHER): Payer: Self-pay | Admitting: Surgery

## 2011-10-06 ENCOUNTER — Ambulatory Visit (INDEPENDENT_AMBULATORY_CARE_PROVIDER_SITE_OTHER): Payer: 59 | Admitting: Surgery

## 2011-10-06 VITALS — BP 144/86 | HR 79 | Temp 97.1°F | Ht 64.0 in | Wt 225.0 lb

## 2011-10-06 DIAGNOSIS — L732 Hidradenitis suppurativa: Secondary | ICD-10-CM

## 2011-10-06 NOTE — Progress Notes (Signed)
General Surgery [facility]  Visit Diagnoses: 1. Hidradenitis suppurativa     HISTORY: Patient is a 56 year old white female known to my practice from previous history of hidradenitis. Over the past 4 months she has had a draining lesion in the left groin. She denies any signs or symptoms of infection. She presents today for evaluation for possible surgical excision.  EXAM: Chest is clear to auscultation. Cardiac exam shows regular rate and rhythm. Extremities are without edema. In the left groin on the medial aspect of the upper thigh is a 2 cm indurated lesion with a central draining sinus with clear serous fluid. There is minimal tenderness. This represents hidradenitis.  IMPRESSION: Hidradenitis left groin  PLAN: The patient and I discussed surgical excision. This can be done under sedation and local anesthetic. We will arrange for outpatient surgery in the near future.  The risks and benefits of the procedure have been discussed at length with the patient.  The patient understands the proposed procedure, potential alternative treatments, and the course of recovery to be expected.  All of the patient's questions have been answered at this time.  The patient wishes to proceed with surgery.  Velora Heckler, MD, FACS General & Endocrine Surgery [facility]

## 2011-10-06 NOTE — Patient Instructions (Signed)
Hidradenitis Suppurativa, Sweat Gland Abscess Hidradenitis suppurativa is a long lasting (chronic), uncommon disease of the sweat glands. With this, boil-like lumps and scarring develop in the groin, some times under the arms (axillae), and under the breasts. It may also uncommonly occur behind the ears, in the crease of the buttocks, and around the genitals.  CAUSES  The cause is from a blocking of the sweat glands. They then become infected. It may cause drainage and odor. It is not contagious. So it cannot be given to someone else. It most often shows up in puberty (about 10 to 54 years of age). But it may happen much later. It is similar to acne which is a disease of the sweat glands. This condition is slightly more common in African-Americans and women. SYMPTOMS   Hidradenitis usually starts as one or more red, tender, swellings in the groin or under the arms (axilla).   Over a period of hours to days the lesions get larger. They often open to the skin surface, draining clear to yellow-colored fluid.   The infected area heals with scarring.  DIAGNOSIS  Your caregiver makes this diagnosis by looking at you. Sometimes cultures (growing germs on plates in the lab) may be taken. This is to see what germ (bacterium) is causing the infection.  TREATMENT   Topical germ killing medicine applied to the skin (antibiotics) are the treatment of choice. Antibiotics taken by mouth (systemic) are sometimes needed when the condition is getting worse or is severe.   Avoid tight-fitting clothing which traps moisture in.   Dirt does not cause hidradenitis and it is not caused by poor hygiene.   Involved areas should be cleaned daily using an antibacterial soap. Some patients find that the liquid form of Lever 2000, applied to the involved areas as a lotion after bathing, can help reduce the odor related to this condition.   Sometimes surgery is needed to drain infected areas or remove scarred tissue.  Removal of large amounts of tissue is used only in severe cases.   Birth control pills may be helpful.   Oral retinoids (vitamin A derivatives) for 6 to 12 months which are effective for acne may also help this condition.   Weight loss will improve but not cure hidradenitis. It is made worse by being overweight. But the condition is not caused by being overweight.   This condition is more common in people who have had acne.   It may become worse under stress.  There is no medical cure for hidradenitis. It can be controlled, but not cured. The condition usually continues for years with periods of getting worse and getting better (remission). Document Released: 09/11/2003 Document Revised: 01/16/2011 Document Reviewed: 09/27/2007 [employer] Patient Information 2012 [employer]. 

## 2011-10-10 ENCOUNTER — Other Ambulatory Visit (INDEPENDENT_AMBULATORY_CARE_PROVIDER_SITE_OTHER): Payer: Self-pay | Admitting: Surgery

## 2011-10-10 DIAGNOSIS — L732 Hidradenitis suppurativa: Secondary | ICD-10-CM

## 2011-10-23 ENCOUNTER — Encounter (INDEPENDENT_AMBULATORY_CARE_PROVIDER_SITE_OTHER): Payer: Self-pay | Admitting: Surgery

## 2011-10-23 ENCOUNTER — Ambulatory Visit (INDEPENDENT_AMBULATORY_CARE_PROVIDER_SITE_OTHER): Payer: 59 | Admitting: Surgery

## 2011-10-23 VITALS — BP 132/88 | HR 74 | Temp 97.4°F | Resp 16 | Ht 63.0 in | Wt 225.8 lb

## 2011-10-23 DIAGNOSIS — L732 Hidradenitis suppurativa: Secondary | ICD-10-CM

## 2011-10-23 NOTE — Patient Instructions (Signed)
Shower wound once daily.  Cleanse wound with Qtip and hydrogen peroxide twice daily.  Apply antibiotic ointment twice daily.  tmg

## 2011-10-23 NOTE — Progress Notes (Signed)
General Surgery [facility]  Visit Diagnoses: 1. Hidradenitis suppurativa     HISTORY: The patient returns for postoperative visit having undergone excision of hidradenitis from the medial left thigh. Unfortunately the wound has opened spontaneously. There is been a small amount of drainage. There is been no sign of infection.  EXAM: Open wound of medial left thigh 2 cm x 1 cm x 1 cm in depth. Wound is cleansed with Q-tip and peroxide and dressed with topical antibiotic ointment and a Band-Aid.  IMPRESSION: Hidradenitis, left medial thigh, wound healing by secondary intention  PLAN: The patient will cleanse the wound twice daily with a Q-tip and hydrogen peroxide. She will apply topical antibiotic ointment. She will shower once daily. This should close by secondary intention in the next 10-14 days. Patient will contact us if there are any further problems. Otherwise she will return for followup as needed.  Velora Heckler, MD, FACS General & Endocrine Surgery [facility]

## 2011-11-03 ENCOUNTER — Encounter (INDEPENDENT_AMBULATORY_CARE_PROVIDER_SITE_OTHER): Payer: 59 | Admitting: Surgery

## 2012-06-24 HISTORY — PX: GASTRIC BYPASS: SHX52

## 2012-08-12 ENCOUNTER — Other Ambulatory Visit: Payer: Self-pay

## 2012-08-12 DIAGNOSIS — Z1231 Encounter for screening mammogram for malignant neoplasm of breast: Secondary | ICD-10-CM

## 2012-09-30 ENCOUNTER — Ambulatory Visit
Admission: RE | Admit: 2012-09-30 | Discharge: 2012-09-30 | Disposition: A | Discharge status: Home / Self Care | Payer: 59 | Source: Ambulatory Visit

## 2012-09-30 DIAGNOSIS — Z1231 Encounter for screening mammogram for malignant neoplasm of breast: Secondary | ICD-10-CM

## 2013-04-28 ENCOUNTER — Encounter (HOSPITAL_COMMUNITY): Payer: Self-pay | Admitting: Emergency Medicine

## 2013-04-28 ENCOUNTER — Emergency Department (HOSPITAL_COMMUNITY)
Admission: EM | Admit: 2013-04-28 | Discharge: 2013-04-28 | Disposition: A | Discharge status: Home / Self Care | Payer: 59 | Attending: Emergency Medicine | Admitting: Emergency Medicine

## 2013-04-28 ENCOUNTER — Emergency Department (HOSPITAL_COMMUNITY): Payer: 59

## 2013-04-28 DIAGNOSIS — R079 Chest pain, unspecified: Secondary | ICD-10-CM | POA: Insufficient documentation

## 2013-04-28 DIAGNOSIS — K5289 Other specified noninfective gastroenteritis and colitis: Secondary | ICD-10-CM | POA: Insufficient documentation

## 2013-04-28 DIAGNOSIS — B349 Viral infection, unspecified: Secondary | ICD-10-CM

## 2013-04-28 DIAGNOSIS — Z79899 Other long term (current) drug therapy: Secondary | ICD-10-CM | POA: Insufficient documentation

## 2013-04-28 DIAGNOSIS — R000 Tachycardia, unspecified: Secondary | ICD-10-CM | POA: Insufficient documentation

## 2013-04-28 DIAGNOSIS — Z951 Presence of aortocoronary bypass graft: Secondary | ICD-10-CM | POA: Insufficient documentation

## 2013-04-28 DIAGNOSIS — Z91040 Latex allergy status: Secondary | ICD-10-CM | POA: Insufficient documentation

## 2013-04-28 DIAGNOSIS — K529 Noninfective gastroenteritis and colitis, unspecified: Secondary | ICD-10-CM

## 2013-04-28 DIAGNOSIS — Z9884 Bariatric surgery status: Secondary | ICD-10-CM | POA: Insufficient documentation

## 2013-04-28 DIAGNOSIS — R42 Dizziness and giddiness: Secondary | ICD-10-CM | POA: Insufficient documentation

## 2013-04-28 DIAGNOSIS — M542 Cervicalgia: Secondary | ICD-10-CM | POA: Insufficient documentation

## 2013-04-28 DIAGNOSIS — I10 Essential (primary) hypertension: Secondary | ICD-10-CM | POA: Insufficient documentation

## 2013-04-28 DIAGNOSIS — Z9089 Acquired absence of other organs: Secondary | ICD-10-CM | POA: Insufficient documentation

## 2013-04-28 DIAGNOSIS — B9789 Other viral agents as the cause of diseases classified elsewhere: Secondary | ICD-10-CM | POA: Insufficient documentation

## 2013-04-28 DIAGNOSIS — Z9851 Tubal ligation status: Secondary | ICD-10-CM | POA: Insufficient documentation

## 2013-04-28 DIAGNOSIS — F411 Generalized anxiety disorder: Secondary | ICD-10-CM | POA: Insufficient documentation

## 2013-04-28 DIAGNOSIS — Z7982 Long term (current) use of aspirin: Secondary | ICD-10-CM | POA: Insufficient documentation

## 2013-04-28 LAB — CBC WITH DIFFERENTIAL/PLATELET
BASOS ABS: 0 10*3/uL (ref 0.0–0.1)
Basophils Relative: 0 % (ref 0–1)
EOS PCT: 1 % (ref 0–5)
Eosinophils Absolute: 0.2 10*3/uL (ref 0.0–0.7)
HCT: 40.3 % (ref 36.0–46.0)
Hemoglobin: 14.2 g/dL (ref 12.0–15.0)
LYMPHS ABS: 1.1 10*3/uL (ref 0.7–4.0)
LYMPHS PCT: 8 % — AB (ref 12–46)
MCH: 30.3 pg (ref 26.0–34.0)
MCHC: 35.2 g/dL (ref 30.0–36.0)
MCV: 86.1 fL (ref 78.0–100.0)
Monocytes Absolute: 0.6 10*3/uL (ref 0.1–1.0)
Monocytes Relative: 4 % (ref 3–12)
Neutro Abs: 12.1 10*3/uL — ABNORMAL HIGH (ref 1.7–7.7)
Neutrophils Relative %: 87 % — ABNORMAL HIGH (ref 43–77)
Platelets: 195 10*3/uL (ref 150–400)
RBC: 4.68 MIL/uL (ref 3.87–5.11)
RDW: 13.2 % (ref 11.5–15.5)
WBC: 13.9 10*3/uL — ABNORMAL HIGH (ref 4.0–10.5)

## 2013-04-28 LAB — COMPREHENSIVE METABOLIC PANEL
ALK PHOS: 93 U/L (ref 39–117)
ALT: 23 U/L (ref 0–35)
AST: 28 U/L (ref 0–37)
Albumin: 3.5 g/dL (ref 3.5–5.2)
BILIRUBIN TOTAL: 0.4 mg/dL (ref 0.3–1.2)
BUN: 27 mg/dL — ABNORMAL HIGH (ref 6–23)
CO2: 22 meq/L (ref 19–32)
Calcium: 9 mg/dL (ref 8.4–10.5)
Chloride: 103 mEq/L (ref 96–112)
Creatinine, Ser: 0.62 mg/dL (ref 0.50–1.10)
GLUCOSE: 125 mg/dL — AB (ref 70–99)
POTASSIUM: 4.3 meq/L (ref 3.7–5.3)
Sodium: 140 mEq/L (ref 137–147)
Total Protein: 6.7 g/dL (ref 6.0–8.3)

## 2013-04-28 LAB — URINALYSIS, ROUTINE W REFLEX MICROSCOPIC
Bilirubin Urine: NEGATIVE
GLUCOSE, UA: NEGATIVE mg/dL
HGB URINE DIPSTICK: NEGATIVE
Ketones, ur: NEGATIVE mg/dL
NITRITE: NEGATIVE
PH: 5 (ref 5.0–8.0)
Protein, ur: NEGATIVE mg/dL
SPECIFIC GRAVITY, URINE: 1.025 (ref 1.005–1.030)
Urobilinogen, UA: 0.2 mg/dL (ref 0.0–1.0)

## 2013-04-28 LAB — URINE MICROSCOPIC-ADD ON

## 2013-04-28 LAB — I-STAT TROPONIN, ED: Troponin i, poc: 0 ng/mL (ref 0.00–0.08)

## 2013-04-28 LAB — LIPASE, BLOOD: Lipase: 48 U/L (ref 11–59)

## 2013-04-28 MED ORDER — METOCLOPRAMIDE HCL 5 MG/ML IJ SOLN
10.0000 mg | INTRAMUSCULAR | Status: AC
  Administered 2013-04-28: 10 mg via INTRAVENOUS
  Filled 2013-04-28: qty 2

## 2013-04-28 MED ORDER — SODIUM CHLORIDE 0.9 % IV BOLUS (SEPSIS)
1000.0000 mL | Freq: Once | INTRAVENOUS | Status: AC
  Administered 2013-04-28: 1000 mL via INTRAVENOUS

## 2013-04-28 MED ORDER — ONDANSETRON HCL 4 MG PO TABS: 4.0000 mg | ORAL_TABLET | Freq: Four times a day (QID) | ORAL | Status: DC

## 2013-04-28 MED ORDER — MORPHINE SULFATE 4 MG/ML IJ SOLN
4.0000 mg | Freq: Once | INTRAMUSCULAR | Status: DC
  Filled 2013-04-28: qty 1

## 2013-04-28 MED ORDER — LOPERAMIDE HCL 2 MG PO CAPS
4.0000 mg | ORAL_CAPSULE | Freq: Once | ORAL | Status: AC
  Administered 2013-04-28: 4 mg via ORAL
  Filled 2013-04-28: qty 2

## 2013-04-28 NOTE — ED Notes (Addendum)
Per EMS, pt has had N/V/D since 2100 yesterday. Pt was hypotensive upon EMS arrival at 90/58, Pt reports epigastric pain, and headache at this time. 4mg  of zofran given in route which relieved nausea. Pt also received 400ml normal saline.   BP: 120/78,  Spo2: 98% RA HR: 87

## 2013-04-28 NOTE — Discharge Instructions (Signed)
Recommend zofran for nausea/vomiting. Recommend imodium for diarrhea as needed. Refrain from eating processed/greasy/fatty foods or heavy milk products. Follow up with your primary doctor in the next few days. Return if symptoms worsen.  Viral Gastroenteritis Viral gastroenteritis is also known as stomach flu. This condition affects the stomach and intestinal tract. It can cause sudden diarrhea and vomiting. The illness typically lasts 3 to 8 days. Most people develop an immune response that eventually gets rid of the virus. While this natural response develops, the virus can make you quite ill. CAUSES  Many different viruses can cause gastroenteritis, such as rotavirus or noroviruses. You can catch one of these viruses by consuming contaminated food or water. You may also catch a virus by sharing utensils or other personal items with an infected person or by touching a contaminated surface. SYMPTOMS  The most common symptoms are diarrhea and vomiting. These problems can cause a severe loss of body fluids (dehydration) and a body salt (electrolyte) imbalance. Other symptoms may include:  Fever.  Headache.  Fatigue.  Abdominal pain. DIAGNOSIS  Your caregiver can usually diagnose viral gastroenteritis based on your symptoms and a physical exam. A stool sample may also be taken to test for the presence of viruses or other infections. TREATMENT  This illness typically goes away on its own. Treatments are aimed at rehydration. The most serious cases of viral gastroenteritis involve vomiting so severely that you are not able to keep fluids down. In these cases, fluids must be given through an intravenous line (IV). HOME CARE INSTRUCTIONS   Drink enough fluids to keep your urine clear or pale yellow. Drink small amounts of fluids frequently and increase the amounts as tolerated.  Ask your caregiver for specific rehydration instructions.  Avoid:  Foods high in sugar.  Alcohol.  Carbonated  drinks.  Tobacco.  Juice.  Caffeine drinks.  Extremely hot or cold fluids.  Fatty, greasy foods.  Too much intake of anything at one time.  Dairy products until 24 to 48 hours after diarrhea stops.  You may consume probiotics. Probiotics are active cultures of beneficial bacteria. They may lessen the amount and number of diarrheal stools in adults. Probiotics can be found in yogurt with active cultures and in supplements.  Wash your hands well to avoid spreading the virus.  Only take over-the-counter or prescription medicines for pain, discomfort, or fever as directed by your caregiver. Do not give aspirin to children. Antidiarrheal medicines are not recommended.  Ask your caregiver if you should continue to take your regular prescribed and over-the-counter medicines.  Keep all follow-up appointments as directed by your caregiver. SEEK IMMEDIATE MEDICAL CARE IF:   You are unable to keep fluids down.  You do not urinate at least once every 6 to 8 hours.  You develop shortness of breath.  You notice blood in your stool or vomit. This may look like coffee grounds.  You have abdominal pain that increases or is concentrated in one small area (localized).  You have persistent vomiting or diarrhea.  You have a fever.  The patient is a child younger than 3 months, and he or she has a fever.  The patient is a child older than 3 months, and he or she has a fever and persistent symptoms.  The patient is a child older than 3 months, and he or she has a fever and symptoms suddenly get worse.  The patient is a baby, and he or she has no tears when crying. MAKE SURE  YOU:   Understand these instructions.  Will watch your condition.  Will get help right away if you are not doing well or get worse. Document Released: 01/27/2005 Document Revised: 04/21/2011 Document Reviewed: 11/13/2010 [facility] Patient Information 2014 [employer], [place]. Diet for Diarrhea, Adult Frequent, runny  stools (diarrhea) may be caused or worsened by food or drink. Diarrhea may be relieved by changing your diet. Since diarrhea can last up to 7 days, it is easy for you to lose too much fluid from the body and become dehydrated. Fluids that are lost need to be replaced. Along with a modified diet, make sure you drink enough fluids to keep your urine clear or pale yellow. DIET INSTRUCTIONS  Ensure adequate fluid intake (hydration): have 1 cup (8 oz) of fluid for each diarrhea episode. Avoid fluids that contain simple sugars or sports drinks, fruit juices, whole milk products, and sodas. Your urine should be clear or pale yellow if you are drinking enough fluids. Hydrate with an oral rehydration solution that you can purchase at pharmacies, retail stores, and online. You can prepare an oral rehydration solution at home by mixing the following ingredients together:    tsp table salt.   tsp baking soda.   tsp salt substitute containing potassium chloride.  1  tablespoons sugar.  1 L (34 oz) of water.  Certain foods and beverages may increase the speed at which food moves through the gastrointestinal (GI) tract. These foods and beverages should be avoided and include:  Caffeinated and alcoholic beverages.  High-fiber foods, such as raw fruits and vegetables, nuts, seeds, and whole grain breads and cereals.  Foods and beverages sweetened with sugar alcohols, such as xylitol, sorbitol, and mannitol.  Some foods may be well tolerated and may help thicken stool including:  Starchy foods, such as rice, toast, pasta, low-sugar cereal, oatmeal, grits, baked potatoes, crackers, and bagels.   Bananas.   Applesauce.  Add probiotic-rich foods to help increase healthy bacteria in the GI tract, such as yogurt and fermented milk products. RECOMMENDED FOODS AND BEVERAGES Starches Choose foods with less than 2 g of fiber per serving.  Recommended:  White, [place], and pita breads, plain rolls, buns,  bagels. Plain muffins, matzo. Soda, saltine, or graham crackers. Pretzels, melba toast, zwieback. Cooked cereals made with water: cornmeal, farina, cream cereals. Dry cereals: refined corn, wheat, rice. Potatoes prepared any way without skins, refined macaroni, spaghetti, noodles, refined rice.  Avoid:  Bread, rolls, or crackers made with whole wheat, multi-grains, rye, bran seeds, nuts, or coconut. Corn tortillas or taco shells. Cereals containing whole grains, multi-grains, bran, coconut, nuts, raisins. Cooked or dry oatmeal. Coarse wheat cereals, granola. Cereals advertised as "high-fiber." Potato skins. Whole grain pasta, wild or brown rice. Popcorn. Sweet potatoes, yams. Sweet rolls, doughnuts, waffles, pancakes, sweet breads. Vegetables  Recommended: Strained tomato and vegetable juices. Most well-cooked and canned vegetables without seeds. Fresh: Tender lettuce, cucumber without the skin, cabbage, spinach, bean sprouts.  Avoid: Fresh, cooked, or canned: Artichokes, baked beans, beet greens, broccoli, Brussels sprouts, corn, kale, legumes, peas, sweet potatoes. Cooked: Green or red cabbage, spinach. Avoid large servings of any vegetables because vegetables shrink when cooked, and they contain more fiber per serving than fresh vegetables. Fruit  Recommended: Cooked or canned: Apricots, applesauce, cantaloupe, cherries, fruit cocktail, grapefruit, grapes, kiwi, mandarin oranges, peaches, pears, plums, watermelon. Fresh: Apples without skin, ripe banana, grapes, cantaloupe, cherries, grapefruit, peaches, oranges, plums. Keep servings limited to  cup or 1 piece.  Avoid: Fresh: Apples  with skin, apricots, mangoes, pears, raspberries, strawberries. Prune juice, stewed or dried prunes. Dried fruits, raisins, dates. Large servings of all fresh fruits. Protein  Recommended: Ground or well-cooked tender beef, ham, veal, lamb, pork, or poultry. Eggs. Fish, oysters, shrimp, lobster, other seafoods. Liver,  organ meats.  Avoid: Tough, fibrous meats with gristle. Peanut butter, smooth or chunky. Cheese, nuts, seeds, legumes, dried peas, beans, lentils. Dairy  Recommended: Yogurt, lactose-free milk, kefir, drinkable yogurt, buttermilk, soy milk, or plain hard cheese.  Avoid: Milk, chocolate milk, beverages made with milk, such as milkshakes. Soups  Recommended: Bouillon, broth, or soups made from allowed foods. Any strained soup.  Avoid: Soups made from vegetables that are not allowed, cream or milk-based soups. Desserts and Sweets  Recommended: Sugar-free gelatin, sugar-free frozen ice pops made without sugar alcohol.  Avoid: Plain cakes and cookies, pie made with fruit, pudding, custard, cream pie. Gelatin, fruit, ice, sherbet, frozen ice pops. Ice cream, ice milk without nuts. Plain hard candy, honey, jelly, molasses, syrup, sugar, chocolate syrup, gumdrops, marshmallows. Fats and Oils  Recommended: Limit fats to less than 8 tsp per day.  Avoid: Seeds, nuts, olives, avocados. Margarine, butter, cream, mayonnaise, salad oils, plain salad dressings. Plain gravy, crisp bacon without rind. Beverages  Recommended: Water, decaffeinated teas, oral rehydration solutions, sugar-free beverages not sweetened with sugar alcohols.  Avoid: Fruit juices, caffeinated beverages (coffee, tea, soda), alcohol, sports drinks, or lemon-lime soda. Condiments  Recommended: Ketchup, mustard, horseradish, vinegar, cocoa powder. Spices in moderation: allspice, basil, bay leaves, celery powder or leaves, cinnamon, cumin powder, curry powder, ginger, mace, marjoram, onion or garlic powder, oregano, paprika, parsley flakes, ground pepper, rosemary, sage, savory, tarragon, thyme, turmeric.  Avoid: Coconut, honey. Document Released: 04/19/2003 Document Revised: 10/22/2011 Document Reviewed: 06/13/2011 [facility] Patient Information 2014 [place], [place].

## 2013-04-28 NOTE — ED Provider Notes (Signed)
58 year old female had onset this evening of aching in her arms, legs, and neck, and chest. This was followed by nausea and vomiting and diarrhea. She did have sick contacts in that her grandson and vomiting 3 days ago. She's not had any fever or chills. On exam, lungs are clear and heart has regular rate and rhythm. Abdomen is soft and nontender with peristalsis hyperactive. ECG is unremarkable and laboratory work he is consistent with acute illness. She received IV fluids and is feeling somewhat better. She's given a dose of loperamide and will be discharged. Overall picture is most consistent with viral gastroenteritis.  Medical screening examination/treatment/procedure(s) were conducted as a shared visit with non-physician practitioner(s) and myself.  I personally evaluated the patient during the encounter.   EKG Interpretation   Date/Time:  Thursday April 28 2013 01:28:55 EDT Ventricular Rate:  96 PR Interval:  164 QRS Duration: 78 QT Interval:  352 QTC Calculation: 445 R Axis:   62 Text Interpretation:  Sinus rhythm RSR' in V1 or V2, right VCD or RVH When  compared with ECG of 11/18/2009, No significant change was found Confirmed  by [facility]  MD, Siana Panameno ([id number]) on 04/28/2013 1:44:54 AM        Dione Boozeavid Zhaniya Swallows, MD 04/28/13 [phone]

## 2013-04-28 NOTE — ED Notes (Signed)
Attempted to collect urine, Pt unable to go at this time.

## 2013-04-28 NOTE — ED Provider Notes (Signed)
CSN: [id number]     Arrival date & time 04/28/13  0055 History   First MD Initiated Contact with Patient 04/28/13 0303     Chief Complaint  Patient presents with  . Headache  . Abdominal Pain     (Consider location/radiation/quality/duration/timing/severity/associated sxs/prior Treatment) HPI Comments: Patient is a 58 year old female with a history of hypertension, cardiac bypass, cholecystectomy, and gastric bypass who presents to the emergency department for nausea, vomiting, and diarrhea. Patient states that symptom onset was approximately at 2100 yesterday. She states that symptoms were preceded by anxiety throughout the course of the day; patient states that she just "didn't feel right". She states that anxiety was followed by a discomfort in her epigastric region and lower chest as well as an occipital headache and posterior neck myalgias. Patient next became nauseous and had an episode of NB/NB emesis and watery nonbloody diarrhea. She endorses persistent headache and diarrhea x 3 since 2100. Patient given Zofran by EMS en route with relief of nausea. She states she feels weak and dehydrated. She denies vision changes, hearing loss, numbness/tingling, hematemesis, melena, hematochezia, SOB, urinary symptoms, and fever. Patient states she picked up her grandchild from school on Monday as he was sick and sent home because of emesis.  Cardiologist - [facility]  The history is provided by the patient. No language interpreter was used.    Past Medical History  Diagnosis Date  . Abnormal Pap smear 06/19/2005    ASCUS   . HGSIL (high grade squamous intraepithelial dysplasia) 2005  . HYQMVHQI(696.2Headache(784.0)    Past Surgical History  Procedure Laterality Date  . Cardiac surgery  2000    Bypass surgery.  . Gallbladder surgery  1981    Removal? Ask patient to clarify.  . Bladder surgery  2004    Ask patient to clarify. Not on history form.  . Cystectomy  06/11/10    REMOVED ON BUTTOCKS   .  Tubal ligation    . Novasure ablation  07/18/2005  . Inguinal hidradenitis excision  2013  . Gastric bypass  06/24/2012   Family History  Problem Relation Age of Onset  . Heart disease Father    History  Substance Use Topics  . Smoking status: Never Smoker   . Smokeless tobacco: Never Used  . Alcohol Use: Yes     Comment: two per month.   OB History   Grav Para Term Preterm Abortions TAB SAB Ect Mult Living   4 2 2  2  2   2      Review of Systems  Constitutional: Positive for chills. Negative for fever.  HENT: Negative for tinnitus and trouble swallowing.   Eyes: Negative for visual disturbance.  Respiratory: Negative for shortness of breath.   Cardiovascular: Positive for chest pain.  Gastrointestinal: Positive for nausea, vomiting, abdominal pain (epigastric) and diarrhea.  Genitourinary: Negative for dysuria and hematuria.  Musculoskeletal: Positive for neck pain. Negative for neck stiffness.  Neurological: Positive for light-headedness and headaches. Negative for syncope, weakness and numbness.  All other systems reviewed and are negative.      Allergies  Iohexol; Latex; and Sulfa antibiotics  Home Medications   Current Outpatient Rx  Name  Route  Sig  Dispense  Refill  . aspirin 81 MG tablet   Oral   Take 81 mg by mouth daily.          . Biotin 1 MG CAPS   Oral   Take 3 tablets by mouth daily.         .Marland Kitchen  loratadine (CLARITIN) 10 MG tablet   Oral   Take 10 mg by mouth daily. Ask patient to clarify dosage. Not indicated on history form dated 06/05/10.          . Multiple Vitamins-Minerals (MULTIVITAMIN WITH MINERALS) tablet   Oral   Take 1 tablet by mouth daily.         . rosuvastatin (CRESTOR) 10 MG tablet   Oral   Take 10 mg by mouth daily.         . sertraline (ZOLOFT) 25 MG tablet   Oral   Take 25 mg by mouth daily. Ask patient to clarify dosage and frequency. Not indicated on history form dated 06/05/10.          Marland Kitchen ondansetron (ZOFRAN)  4 MG tablet   Oral   Take 1 tablet (4 mg total) by mouth every 6 (six) hours.   12 tablet   0    BP 110/52  Pulse 102  Temp(Src) 98.3 F (36.8 C) (Oral)  Resp 17  Ht 5\' 3"  (1.6 m)  Wt 177 lb (80.287 kg)  BMI 31.36 kg/m2  SpO2 97%  Physical Exam  Nursing note and vitals reviewed. Constitutional: She is oriented to person, place, and time. She appears well-developed and well-nourished. No distress.  Patient mildly anxious appearing. She is nontoxic and nonseptic appearing and in no acute distress.  HENT:  Head: Normocephalic and atraumatic.  Mouth/Throat: No oropharyngeal exudate.  Oropharynx clear. Dry mm.  Eyes: Conjunctivae and EOM are normal. Pupils are equal, round, and reactive to light. No scleral icterus.  Neck: Normal range of motion. Neck supple.  No nuchal rigidity or meningismus.  Cardiovascular: Regular rhythm and normal heart sounds.  Tachycardia present.   Tachy to 102  Pulmonary/Chest: Effort normal. No respiratory distress. She has no wheezes. She has no rales.  Abdominal: Soft. She exhibits no distension. There is tenderness (epigastric). There is no rebound and no guarding.  No peritoneal signs. No tenderness at McBurney's point  Musculoskeletal: Normal range of motion.  Neurological: She is alert and oriented to person, place, and time.  GCS 15. Speech is goal oriented. Patient moves extremities without ataxia.  Skin: Skin is warm and dry. No rash noted. She is not diaphoretic. No erythema. No pallor.  Psychiatric: She has a normal mood and affect. Her behavior is normal.    ED Course  Procedures (including critical care time) Labs Review Labs Reviewed  CBC WITH DIFFERENTIAL - Abnormal; Notable for the following:    WBC 13.9 (*)    Neutrophils Relative % 87 (*)    Neutro Abs 12.1 (*)    Lymphocytes Relative 8 (*)    All other components within normal limits  COMPREHENSIVE METABOLIC PANEL - Abnormal; Notable for the following:    Glucose, Bld 125  (*)    BUN 27 (*)    All other components within normal limits  URINALYSIS, ROUTINE W REFLEX MICROSCOPIC - Abnormal; Notable for the following:    APPearance CLOUDY (*)    Leukocytes, UA MODERATE (*)    All other components within normal limits  URINE MICROSCOPIC-ADD ON - Abnormal; Notable for the following:    Squamous Epithelial / LPF MANY (*)    Bacteria, UA FEW (*)    All other components within normal limits  LIPASE, BLOOD  I-STAT TROPOININ, ED   Imaging Review Dg Abd Acute W/chest  04/28/2013   CLINICAL DATA:  Abdominal pain, nausea vomiting  EXAM: ACUTE ABDOMEN SERIES (  ABDOMEN 2 VIEW & CHEST 1 VIEW)  COMPARISON:  Prior CT from 12/11/2009  FINDINGS: Median sternotomy wires noted. Cardiac and mediastinal silhouettes are within normal limits.  Lungs are normally inflated. No focal infiltrate, pulmonary edema, or pleural effusion. There is no pneumothorax.  Visualized bowel gas pattern is within normal limits without evidence of obstruction or ileus. No free intraperitoneal air. Surgical clips present at the GE junction. Cholecystectomy clips noted. Suture material present within the left hemi abdomen. No soft tissue mass or abnormal calcification.  Multilevel degenerative changes and within the visualized spine. No acute osseous abnormality.  IMPRESSION: 1. Nonobstructive bowel gas pattern with no radiographic evidence of acute intra-abdominal process. 2. No acute cardiopulmonary abnormality.   Electronically Signed   By: Rise Mu M.D.   On: 04/28/2013 04:10     EKG Interpretation   Date/Time:  Thursday April 28 2013 01:28:55 EDT Ventricular Rate:  96 PR Interval:  164 QRS Duration: 78 QT Interval:  352 QTC Calculation: 445 R Axis:   62 Text Interpretation:  Sinus rhythm RSR' in V1 or V2, right VCD or RVH When  compared with ECG of 11/18/2009, No significant change was found Confirmed  by [facility]  MD, DAVID ([id number]) on 04/28/2013 1:44:54 AM      MDM   Final diagnoses:   Viral illness  Gastroenteritis    58 y/o female presents for N/V/D with associated lightheadedness, headache, and epigastric pain/lower chest discomfort. Patient nontoxic/nonseptic appearing on initial presentation. She is afebrile. Patient has had 3 episodes of nonbloody, watery diarrhea since arrival in the ED today. She exhibits TTP in her epigastric region to moderate palpation. No evidence of peritonitis. No guarding or distention.  Patient tx in ED with antiemetics and IVF. BP has remained stable over ED course. Patient has been visibly anxious which is likely the cause of her mild tachycardia. She endorses a hx of anxiety and has declined both anti-anxiety medication and pain medication in ED today. Work up reveals a mild leukocytosis with left shift c/w acute illness. Cardiac work up negative.  Doubt ACS given atypical nature of symptoms and associated vomiting/diarrhea c/w viral gastroenteritis. Patient also endorses sick contacts as she picked her grandson up from school 3 days ago who was ill with emesis. Serial abdominal exams stable. Xray does not show obstructive pattern or free air. Do not believe further emergent work up is indicated at this time.  Patient stable and appropriate for d/c with instruction to f/u with her PCP. Zofran prescribed for nausea/emesis and Imodium advised for diarrhea PRN. Return precautions advised and patient agreeable to plan with no unaddressed concerns.   Filed Vitals:   04/28/13 0230 04/28/13 0300 04/28/13 0444 04/28/13 0515  BP: 102/56 111/51 100/57 110/52  Pulse: 95 101  102  Temp:   98.3 F (36.8 C)   TempSrc:   Oral   Resp: 12 28 16 17   Height:      Weight:      SpO2: 96% 94% 96% 97%     Antony Madura, PA-C 04/28/13 0541

## 2013-09-01 ENCOUNTER — Other Ambulatory Visit: Payer: Self-pay

## 2013-09-01 DIAGNOSIS — Z1231 Encounter for screening mammogram for malignant neoplasm of breast: Secondary | ICD-10-CM

## 2013-09-28 ENCOUNTER — Other Ambulatory Visit: Payer: 59

## 2013-10-03 ENCOUNTER — Ambulatory Visit: Payer: Managed Care, Other (non HMO)

## 2013-10-05 ENCOUNTER — Ambulatory Visit
Admission: RE | Admit: 2013-10-05 | Discharge: 2013-10-05 | Disposition: A | Discharge status: Home / Self Care | Payer: 59 | Source: Ambulatory Visit

## 2013-10-05 DIAGNOSIS — Z1231 Encounter for screening mammogram for malignant neoplasm of breast: Secondary | ICD-10-CM

## 2013-10-11 ENCOUNTER — Ambulatory Visit (INDEPENDENT_AMBULATORY_CARE_PROVIDER_SITE_OTHER): Payer: 59 | Admitting: *Deleted

## 2013-10-11 ENCOUNTER — Encounter: Payer: Self-pay | Admitting: Interventional Cardiology

## 2013-10-11 ENCOUNTER — Ambulatory Visit (INDEPENDENT_AMBULATORY_CARE_PROVIDER_SITE_OTHER): Payer: 59 | Admitting: Interventional Cardiology

## 2013-10-11 VITALS — BP 128/82 | HR 78 | Ht 63.0 in | Wt 187.1 lb

## 2013-10-11 DIAGNOSIS — Z951 Presence of aortocoronary bypass graft: Secondary | ICD-10-CM

## 2013-10-11 DIAGNOSIS — E785 Hyperlipidemia, unspecified: Secondary | ICD-10-CM

## 2013-10-11 DIAGNOSIS — I10 Essential (primary) hypertension: Secondary | ICD-10-CM

## 2013-10-11 DIAGNOSIS — I25810 Atherosclerosis of coronary artery bypass graft(s) without angina pectoris: Secondary | ICD-10-CM | POA: Insufficient documentation

## 2013-10-11 DIAGNOSIS — Z803 Family history of malignant neoplasm of breast: Secondary | ICD-10-CM

## 2013-10-11 HISTORY — DX: Essential (primary) hypertension: I10

## 2013-10-11 LAB — LIPID PANEL
CHOLESTEROL: 217 mg/dL — AB (ref 0–200)
HDL: 49.6 mg/dL (ref >39.00)
LDL Cholesterol: 132 mg/dL — ABNORMAL HIGH (ref 0–99)
NonHDL: 167.4
Total CHOL/HDL Ratio: 4
Triglycerides: 175 mg/dL — ABNORMAL HIGH (ref 0.0–149.0)
VLDL: 35 mg/dL (ref 0.0–40.0)

## 2013-10-11 LAB — ALT: ALT: 16 U/L (ref 0–35)

## 2013-10-11 MED ORDER — ROSUVASTATIN CALCIUM 10 MG PO TABS: 10.0000 mg | ORAL_TABLET | Freq: Every day | ORAL | Status: DC

## 2013-10-11 MED ORDER — BENAZEPRIL-HYDROCHLOROTHIAZIDE 10-12.5 MG PO TABS: 1.0000 | ORAL_TABLET | Freq: Every day | ORAL | Status: DC

## 2013-10-11 NOTE — Patient Instructions (Signed)
Your physician has recommended you make the following change in your medication:  1) Resume taking Crestor  daily an Rx has been sent to your pharmacy A refill for Lotensin has been sent to your pharmacy  Your physician wants you to follow-up in: 1 year with Dr.Smith You will receive a reminder letter in the mail two months in advance. If you don't receive a letter, please call our office to schedule the follow-up appointment.

## 2013-10-11 NOTE — Progress Notes (Signed)
Patient ID: Sierra Lucas, female   DOB: 11-Dec-1955, 58 y.o.   MRN: [id number]    [address] [place], [place]  [address] Phone: [phone] Fax:  [phone]  Date:  10/11/2013   ID:  Sierra Lucas, DOB 02-05-1956, MRN [id number]  PCP:  Gweneth Dimitri, MD   ASSESSMENT:  1. Coronary artery disease, stable without angina 2. Hyperlipidemia with total cholesterol 217 and LDL 133 off lipid therapy 3. Hypertension controlled    PLAN:  1. Resume Crestor 10 mg per day 2. Benazepril HCTZ 10/12.5 mg daily 3. Clinical followup in one year    SUBJECTIVE: Sierra Lucas is a 57 y.o. female asymptomatic with reference to see the complaints. She has not had syncope. She denies any occurrence of palpitation. No episodes of syncope, claudication, or angina have occurred. Overall she feels well. She has not had transient neurological complaints and denies lung disease.   Wt Readings from Last 3 Encounters:  10/11/13 187 lb 1.9 oz (84.877 kg)  04/28/13 177 lb (80.287 kg)  10/23/11 225 lb 12.8 oz (102.422 kg)     Past Medical History  Diagnosis Date  . Abnormal Pap smear 06/19/2005    ASCUS   . HGSIL (high grade squamous intraepithelial dysplasia) 2005  . [id number](272.5)     Current Outpatient Prescriptions  Medication Sig Dispense Refill  . ALPRAZolam (XANAX) 0.5 MG tablet Take 0.5 mg by mouth as needed for anxiety.       Marland Kitchen aspirin 81 MG tablet Take 81 mg by mouth daily.       . benazepril-hydrochlorthiazide (LOTENSIN HCT) 10-12.5 MG per tablet Take 1 tablet by mouth daily.      . Biotin 1 MG CAPS Take 3 tablets by mouth daily.      Marland Kitchen loratadine (CLARITIN) 10 MG tablet Take 10 mg by mouth daily. Ask patient to clarify dosage. Not indicated on history form dated 06/05/10.       . Multiple Vitamins-Minerals (MULTIVITAMIN WITH MINERALS) tablet Take 1 tablet by mouth daily.      . sertraline (ZOLOFT) 25 MG tablet Take 25 mg by mouth daily. Ask patient to clarify  dosage and frequency. Not indicated on history form dated 06/05/10.        No current facility-administered medications for this visit.    Allergies:    Iodinated contrast Sulfa Latex  Social History:  The patient  reports that she has never smoked. She has never used smokeless tobacco. She reports that she drinks alcohol. She reports that she does not use illicit drugs.   ROS:  Please see the history of present illness.   Weight stable. Denies edema. Still spends considerable time in [place].   All other systems reviewed and negative.   OBJECTIVE: VS:  BP 128/82  Pulse 78  Ht  (1.6 m)  Wt 187 lb 1.9 oz (84.877 kg)  BMI 33.16 kg/m2 Well nourished, well developed, in no acute distress, moderately obese, tanned skin HEENT: normal Neck: JVD flat. Carotid bruit absent  Cardiac:  normal S1, S2; RRR; no murmur Lungs:  clear to auscultation bilaterally, no wheezing, rhonchi or rales Abd: soft, nontender, no hepatomegaly Ext: Edema absent. Pulses 2+ Skin: warm and dry Neuro:  CNs 2-12 intact, no focal abnormalities noted  EKG:  Normal sinus rhythm with precordial T-wave inversion V1 through V3       Signed, Darci Needle III, MD 10/11/2013 4:53 PM

## 2013-10-12 ENCOUNTER — Telehealth: Payer: Self-pay | Admitting: Interventional Cardiology

## 2013-10-12 MED ORDER — HYDROCHLOROTHIAZIDE 12.5 MG PO CAPS: 12.5000 mg | ORAL_CAPSULE | Freq: Every day | ORAL | Status: DC

## 2013-10-12 NOTE — Telephone Encounter (Signed)
New message     Pt was seen yesterday and was to check her medication and call Sierra Lucas back.  She has been taking HCTZ 12.5mg  tablets and Dr Katrinka Blazing called in Lotensin/Hct.  She has not picked up this medicine yet.  Should she get the new medication or will Dr Katrinka Blazing call her in the HCTZ?

## 2013-10-12 NOTE — Telephone Encounter (Signed)
Don't want to add anything she was not taking. We just need to refill the Hctz and change the med list to reflect current therapy.

## 2013-10-12 NOTE — Telephone Encounter (Signed)
pt aware. continue HCTZ 12.5mg  daily.Rx sent to pt pharmacy

## 2013-10-12 NOTE — Telephone Encounter (Signed)
pt was seen in the office  by Dr.Smith on 9/1. pt  was to call back to verify bp med. pt is not taking Lotensin as listed on med list. pt is taking HCTZ 12.5mg . pt med list updated.adv pt I will send Dr.Smith a message to verify if pt should just continue taking HCTZ or if he would like to change pt to combination Lotensin. Adv pt I will call her back with his recommendations. Pt agreeable and verbalized understanding.

## 2013-12-12 ENCOUNTER — Encounter: Payer: Self-pay | Admitting: Interventional Cardiology

## 2013-12-18 ENCOUNTER — Encounter: Payer: Self-pay | Admitting: *Deleted

## 2014-03-31 ENCOUNTER — Other Ambulatory Visit (HOSPITAL_COMMUNITY): Payer: Self-pay | Admitting: Otolaryngology

## 2014-03-31 DIAGNOSIS — R479 Unspecified speech disturbances: Secondary | ICD-10-CM

## 2014-04-14 ENCOUNTER — Ambulatory Visit (HOSPITAL_COMMUNITY): Payer: Managed Care, Other (non HMO)

## 2014-07-19 ENCOUNTER — Telehealth: Payer: Self-pay | Admitting: Interventional Cardiology

## 2014-07-19 DIAGNOSIS — E785 Hyperlipidemia, unspecified: Secondary | ICD-10-CM

## 2014-07-19 NOTE — Telephone Encounter (Signed)
Pt fasting lab orders (lipid,alt) is in Epic. Lmom. Pt can have fasting labs the same day as her o/v on 9/15 with Dr.Smith,or if she would prefer to have her labs prior to her o/v she should call back to schedule a lab appt

## 2014-07-19 NOTE — Telephone Encounter (Signed)
New Message   Pt schedule follow up appt. Usually has labs. Please put in orders

## 2014-10-23 ENCOUNTER — Other Ambulatory Visit: Payer: Managed Care, Other (non HMO)

## 2014-10-26 ENCOUNTER — Other Ambulatory Visit (INDEPENDENT_AMBULATORY_CARE_PROVIDER_SITE_OTHER): Payer: 59 | Admitting: *Deleted

## 2014-10-26 DIAGNOSIS — E785 Hyperlipidemia, unspecified: Secondary | ICD-10-CM

## 2014-10-26 LAB — LIPID PANEL
Cholesterol: 136 mg/dL (ref 0–200)
HDL: 59.3 mg/dL (ref >39.00)
LDL Cholesterol: 43 mg/dL (ref 0–99)
NonHDL: 76.21
Total CHOL/HDL Ratio: 2
Triglycerides: 164 mg/dL — ABNORMAL HIGH (ref 0.0–149.0)
VLDL: 32.8 mg/dL (ref 0.0–40.0)

## 2014-10-26 LAB — ALT: ALT: 24 U/L (ref 0–35)

## 2014-10-27 ENCOUNTER — Ambulatory Visit (INDEPENDENT_AMBULATORY_CARE_PROVIDER_SITE_OTHER): Payer: 59 | Admitting: Interventional Cardiology

## 2014-10-27 ENCOUNTER — Encounter: Payer: Self-pay | Admitting: Interventional Cardiology

## 2014-10-27 VITALS — BP 132/86 | HR 73 | Ht 63.0 in | Wt 193.1 lb

## 2014-10-27 DIAGNOSIS — Z951 Presence of aortocoronary bypass graft: Secondary | ICD-10-CM

## 2014-10-27 DIAGNOSIS — I25812 Atherosclerosis of bypass graft of coronary artery of transplanted heart without angina pectoris: Secondary | ICD-10-CM

## 2014-10-27 DIAGNOSIS — I10 Essential (primary) hypertension: Secondary | ICD-10-CM | POA: Diagnosis not present

## 2014-10-27 DIAGNOSIS — E785 Hyperlipidemia, unspecified: Secondary | ICD-10-CM | POA: Diagnosis not present

## 2014-10-27 MED ORDER — HYDROCHLOROTHIAZIDE 12.5 MG PO CAPS: 12.5000 mg | ORAL_CAPSULE | Freq: Every day | ORAL | Status: DC

## 2014-10-27 MED ORDER — ROSUVASTATIN CALCIUM 10 MG PO TABS: 10.0000 mg | ORAL_TABLET | Freq: Every day | ORAL | Status: DC

## 2014-10-27 NOTE — Progress Notes (Signed)
Cardiology Office Note   Date:  10/27/2014   ID:  CAMELA Lucas, DOB March 19, 1955, MRN [id number]  PCP:  Gweneth Dimitri, MD  Cardiologist:  Lesleigh Noe, MD   Chief Complaint  Patient presents with  . Coronary Artery Disease      History of Present Illness: Sierra Lucas is a 59 y.o. 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 She has hyperlipidemia and essential hypertension.  The patient lives part of the time in [place]. When in [place] she lives now in [place] and here in [place] where she has children and grandchildren. Overall she is doing well. She has had no chest discomfort or angina. She denies syncope. No palpitations. Side effects of been noted.    Past Medical History  Diagnosis Date  . Abnormal Pap smear 06/19/2005    ASCUS   . HGSIL (high grade squamous intraepithelial dysplasia) 2005  . Headache(784.0)   . Systemic hypertension 10/11/2013    Past Surgical History  Procedure Laterality Date  . Cardiac surgery  2000    Bypass surgery.  . Gallbladder surgery  1981    Removal? Ask patient to clarify.  . Bladder surgery  2004    Ask patient to clarify. Not on history form.  . Cystectomy  06/11/10    REMOVED ON BUTTOCKS   . Tubal ligation    . Novasure ablation  07/18/2005  . Inguinal hidradenitis excision  2013  . Gastric bypass  06/24/2012     Current Outpatient Prescriptions  Medication Sig Dispense Refill  . ALPRAZolam (XANAX) 0.5 MG tablet Take 0.5 mg by mouth as needed for anxiety.     Marland Kitchen aspirin 81 MG tablet Take 81 mg by mouth daily.     . Biotin 1 MG CAPS Take 3 tablets by mouth daily.    . hydrochlorothiazide (MICROZIDE) 12.5 MG capsule Take 1 capsule (12.5 mg total) by mouth daily. 90 capsule 3  . loratadine (CLARITIN) 10 MG tablet Take 10 mg by mouth daily. Ask patient to clarify dosage. Not indicated on history form dated 06/05/10.     . Multiple Vitamins-Minerals (MULTIVITAMIN WITH  MINERALS) tablet Take 1 tablet by mouth daily.    . rosuvastatin (CRESTOR) 10 MG tablet Take 1 tablet (10 mg total) by mouth daily. 90 tablet 3  . venlafaxine XR (EFFEXOR-XR) 150 MG 24 hr capsule Take 150 mg by mouth daily.     No current facility-administered medications for this visit.    Allergies:   Iohexol; Latex; and Sulfa antibiotics    Social History:  The patient  reports that she has never smoked. She has never used smokeless tobacco. She reports that she drinks alcohol. She reports that she does not use illicit drugs.   Family History:  The patient's family history includes Heart disease in her father.    ROS:  Please see the history of present illness.   Otherwise, review of systems are positive for snoring, anxiety, weight gain..   All other systems are reviewed and negative.    PHYSICAL EXAM: VS:  BP 132/86 mmHg  Pulse 73  Ht 5\' 3"  (1.6 m)  Wt 87.599 kg (193 lb 1.9 oz)  BMI 34.22 kg/m2 , BMI Body mass index is 34.22 kg/(m^2). GEN: Well nourished, well developed, in no acute distress HEENT: normal Neck: no JVD, carotid bruits, or masses Cardiac: RRR.  There is no murmur, rub, or gallop. There is no edema. Respiratory:  clear to auscultation bilaterally, normal work of breathing. GI: soft, nontender, nondistended, + BS MS: no deformity or atrophy Skin: warm and dry, no rash Neuro:  Strength and sensation are intact Psych: euthymic mood, full affect   EKG:  EKG is ordered today. The ekg reveals normal sinus rhythm with T-wave inversion V1 and V2. No change when compared to prior.   Recent Labs: 10/26/2014: ALT 24    Lipid Panel    Component Value Date/Time   CHOL 136 10/26/2014 0805   TRIG 164.0* 10/26/2014 0805   HDL 59.30 10/26/2014 0805   CHOLHDL 2 10/26/2014 0805   VLDL 32.8 10/26/2014 0805   LDLCALC 43 10/26/2014 0805      Wt Readings from Last 3 Encounters:  10/27/14 87.599 kg (193 lb 1.9 oz)  10/11/13 84.877 kg (187 lb 1.9 oz)  04/28/13  80.287 kg (177 lb)      Other studies Reviewed: Additional studies/ records that were reviewed today include: No recent functional testing. Lipids are at target with total cholesterol less than 170 and LDL near 50..    ASSESSMENT AND PLAN:  1. 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 Hyperlipidemia At target lipids  3. Essential hypertension Controlled    Current medicines are reviewed at length with the patient today.  The patient has the following concerns regarding medicines: She is now living partially in [place]. I've advised her to find a cardiologist in [place]..  The following changes/actions have been instituted:    Future considerations would be a sleep study  Consider functional testing within the next 12 months.  Aerobic activity  Labs/ tests ordered today include:   Orders Placed This Encounter  Procedures  . Lipid panel  . ALT  . EKG 12-Lead     Disposition:   FU with HS in 1 year  Signed, Lesleigh Noe, MD  10/27/2014 9:06 AM    [facility] [address] [place], [place], [place]  [address] Phone: [phone]; Fax: [phone]

## 2014-10-27 NOTE — Patient Instructions (Signed)
Medication Instructions:  Your physician recommends that you continue on your current medications as directed. Please refer to the Current Medication list given to you today.   Labwork: Your physician recommends that you return for a FASTING lipid profile and alt in 1 year   Testing/Procedures: None ordered  Follow-Up: Your physician wants you to follow-up in: 1 year with Dr.Smith You will receive a reminder letter in the mail two months in advance. If you don't receive a letter, please call our office to schedule the follow-up appointment.   Any Other Special Instructions Will Be Listed Below (If Applicable).

## 2014-10-31 ENCOUNTER — Telehealth: Payer: Self-pay

## 2014-10-31 NOTE — Telephone Encounter (Signed)
-----   Message from Lyn Records, MD sent at 10/27/2014  6:35 PM EDT ----- Labs are at target. No change required. Recheck in one year.

## 2014-10-31 NOTE — Telephone Encounter (Signed)
called to give pt lab results.lmtcb 

## 2015-10-11 ENCOUNTER — Other Ambulatory Visit: Payer: Self-pay | Admitting: Interventional Cardiology

## 2015-10-11 DIAGNOSIS — E785 Hyperlipidemia, unspecified: Secondary | ICD-10-CM

## 2015-10-16 ENCOUNTER — Telehealth: Payer: Self-pay | Admitting: Interventional Cardiology

## 2015-10-16 DIAGNOSIS — E785 Hyperlipidemia, unspecified: Secondary | ICD-10-CM

## 2015-10-16 NOTE — Telephone Encounter (Signed)
New Message  9.16.17 lab order expires and pt wants to come in for labs on 12.13.17 sameday as appt with MD Katrinka BlazingSmith.  Thanks!

## 2015-10-17 NOTE — Telephone Encounter (Signed)
Will put a note in the patients appt comments that she will needs labs the same day as appt with Dr.Smith, orders are in epic for a lipid and lft.

## 2016-01-10 ENCOUNTER — Other Ambulatory Visit: Payer: Self-pay | Admitting: Interventional Cardiology

## 2016-01-10 DIAGNOSIS — E785 Hyperlipidemia, unspecified: Secondary | ICD-10-CM

## 2016-01-23 ENCOUNTER — Ambulatory Visit: Payer: 59 | Admitting: Interventional Cardiology

## 2016-04-07 ENCOUNTER — Other Ambulatory Visit: Payer: Self-pay | Admitting: Interventional Cardiology

## 2016-04-07 DIAGNOSIS — E785 Hyperlipidemia, unspecified: Secondary | ICD-10-CM

## 2016-04-17 ENCOUNTER — Ambulatory Visit: Payer: 59 | Admitting: Interventional Cardiology

## 2016-04-23 ENCOUNTER — Encounter: Payer: Self-pay | Admitting: Interventional Cardiology

## 2016-04-30 ENCOUNTER — Encounter: Payer: Self-pay | Admitting: Interventional Cardiology

## 2017-01-07 ENCOUNTER — Ambulatory Visit (HOSPITAL_COMMUNITY)
Admission: EM | Admit: 2017-01-07 | Discharge: 2017-01-07 | Disposition: A | Discharge status: Home / Self Care | Payer: 59 | Source: Home / Self Care

## 2017-01-07 ENCOUNTER — Other Ambulatory Visit: Payer: Self-pay

## 2017-01-07 ENCOUNTER — Encounter (HOSPITAL_COMMUNITY): Payer: Self-pay | Admitting: *Deleted

## 2017-01-07 ENCOUNTER — Emergency Department (HOSPITAL_COMMUNITY): Payer: 59

## 2017-01-07 ENCOUNTER — Observation Stay (HOSPITAL_COMMUNITY)
Admission: EM | Admit: 2017-01-07 | Discharge: 2017-01-09 | DRG: 378 | Disposition: A | Discharge status: Home / Self Care | Payer: 59 | Attending: Family Medicine | Admitting: Family Medicine

## 2017-01-07 DIAGNOSIS — Z7982 Long term (current) use of aspirin: Secondary | ICD-10-CM | POA: Diagnosis not present

## 2017-01-07 DIAGNOSIS — Z91040 Latex allergy status: Secondary | ICD-10-CM | POA: Diagnosis not present

## 2017-01-07 DIAGNOSIS — E669 Obesity, unspecified: Secondary | ICD-10-CM | POA: Insufficient documentation

## 2017-01-07 DIAGNOSIS — I2510 Atherosclerotic heart disease of native coronary artery without angina pectoris: Secondary | ICD-10-CM | POA: Diagnosis not present

## 2017-01-07 DIAGNOSIS — Z79899 Other long term (current) drug therapy: Secondary | ICD-10-CM | POA: Insufficient documentation

## 2017-01-07 DIAGNOSIS — Z6835 Body mass index (BMI) 35.0-35.9, adult: Secondary | ICD-10-CM | POA: Diagnosis not present

## 2017-01-07 DIAGNOSIS — K921 Melena: Secondary | ICD-10-CM | POA: Diagnosis not present

## 2017-01-07 DIAGNOSIS — K259 Gastric ulcer, unspecified as acute or chronic, without hemorrhage or perforation: Secondary | ICD-10-CM | POA: Insufficient documentation

## 2017-01-07 DIAGNOSIS — K289 Gastrojejunal ulcer, unspecified as acute or chronic, without hemorrhage or perforation: Secondary | ICD-10-CM | POA: Insufficient documentation

## 2017-01-07 DIAGNOSIS — D649 Anemia, unspecified: Secondary | ICD-10-CM | POA: Diagnosis not present

## 2017-01-07 DIAGNOSIS — Z951 Presence of aortocoronary bypass graft: Secondary | ICD-10-CM | POA: Insufficient documentation

## 2017-01-07 DIAGNOSIS — R51 Headache: Secondary | ICD-10-CM | POA: Diagnosis not present

## 2017-01-07 DIAGNOSIS — I10 Essential (primary) hypertension: Secondary | ICD-10-CM | POA: Insufficient documentation

## 2017-01-07 DIAGNOSIS — E785 Hyperlipidemia, unspecified: Secondary | ICD-10-CM | POA: Insufficient documentation

## 2017-01-07 DIAGNOSIS — D62 Acute posthemorrhagic anemia: Secondary | ICD-10-CM | POA: Insufficient documentation

## 2017-01-07 DIAGNOSIS — Z9884 Bariatric surgery status: Secondary | ICD-10-CM | POA: Diagnosis not present

## 2017-01-07 DIAGNOSIS — Z882 Allergy status to sulfonamides status: Secondary | ICD-10-CM | POA: Diagnosis not present

## 2017-01-07 DIAGNOSIS — K922 Gastrointestinal hemorrhage, unspecified: Secondary | ICD-10-CM | POA: Diagnosis not present

## 2017-01-07 DIAGNOSIS — Z91041 Radiographic dye allergy status: Secondary | ICD-10-CM | POA: Insufficient documentation

## 2017-01-07 DIAGNOSIS — R0602 Shortness of breath: Secondary | ICD-10-CM | POA: Diagnosis present

## 2017-01-07 HISTORY — DX: Atherosclerotic heart disease of native coronary artery without angina pectoris: I25.10

## 2017-01-07 HISTORY — DX: Gastrointestinal hemorrhage, unspecified: K92.2

## 2017-01-07 LAB — CBC
HCT: 21.7 % — ABNORMAL LOW (ref 36.0–46.0)
HEMOGLOBIN: 7.1 g/dL — AB (ref 12.0–15.0)
MCH: 29.3 pg (ref 26.0–34.0)
MCHC: 32.7 g/dL (ref 30.0–36.0)
MCV: 89.7 fL (ref 78.0–100.0)
PLATELETS: 287 10*3/uL (ref 150–400)
RBC: 2.42 MIL/uL — ABNORMAL LOW (ref 3.87–5.11)
RDW: 16 % — AB (ref 11.5–15.5)
WBC: 14.4 10*3/uL — ABNORMAL HIGH (ref 4.0–10.5)

## 2017-01-07 LAB — BASIC METABOLIC PANEL
ANION GAP: 9 (ref 5–15)
BUN: 25 mg/dL — ABNORMAL HIGH (ref 6–20)
CALCIUM: 8.5 mg/dL — AB (ref 8.9–10.3)
CO2: 23 mmol/L (ref 22–32)
CREATININE: 0.62 mg/dL (ref 0.44–1.00)
Chloride: 104 mmol/L (ref 101–111)
GFR calc Af Amer: >60 mL/min (ref >60)
GLUCOSE: 103 mg/dL — AB (ref 65–99)
Potassium: 3.3 mmol/L — ABNORMAL LOW (ref 3.5–5.1)
Sodium: 136 mmol/L (ref 135–145)

## 2017-01-07 LAB — HEPATIC FUNCTION PANEL
ALK PHOS: 51 U/L (ref 38–126)
ALT: 21 U/L (ref 14–54)
AST: 28 U/L (ref 15–41)
Albumin: 3.2 g/dL — ABNORMAL LOW (ref 3.5–5.0)
BILIRUBIN DIRECT: 0.1 mg/dL (ref 0.1–0.5)
BILIRUBIN INDIRECT: 0.2 mg/dL — AB (ref 0.3–0.9)
BILIRUBIN TOTAL: 0.3 mg/dL (ref 0.3–1.2)
Total Protein: 6.1 g/dL — ABNORMAL LOW (ref 6.5–8.1)

## 2017-01-07 LAB — PREPARE RBC (CROSSMATCH)

## 2017-01-07 LAB — I-STAT BETA HCG BLOOD, ED (MC, WL, AP ONLY): HCG, QUANTITATIVE: 5.8 m[IU]/mL — AB (ref <5)

## 2017-01-07 LAB — PROTIME-INR
INR: 0.99
PROTHROMBIN TIME: 13 s (ref 11.4–15.2)

## 2017-01-07 LAB — TROPONIN I: Troponin I: <0.03 ng/mL (ref <0.03)

## 2017-01-07 LAB — ABO/RH: ABO/RH(D): O NEG

## 2017-01-07 MED ORDER — PANTOPRAZOLE SODIUM 40 MG IV SOLR: 40.0000 mg | Freq: Two times a day (BID) | INTRAVENOUS | Status: DC

## 2017-01-07 MED ORDER — ONDANSETRON HCL 4 MG/2ML IJ SOLN: 4.0000 mg | Freq: Four times a day (QID) | INTRAMUSCULAR | Status: DC | PRN

## 2017-01-07 MED ORDER — VENLAFAXINE HCL ER 150 MG PO CP24
150.0000 mg | ORAL_CAPSULE | Freq: Every day | ORAL | Status: DC
  Administered 2017-01-08 – 2017-01-09 (×2): 150 mg via ORAL
  Filled 2017-01-07 (×3): qty 1

## 2017-01-07 MED ORDER — ALPRAZOLAM 0.5 MG PO TABS: 0.5000 mg | ORAL_TABLET | ORAL | Status: DC | PRN

## 2017-01-07 MED ORDER — PANTOPRAZOLE SODIUM 40 MG IV SOLR
40.0000 mg | Freq: Once | INTRAVENOUS | Status: AC
  Administered 2017-01-07: 40 mg via INTRAVENOUS

## 2017-01-07 MED ORDER — ACETAMINOPHEN 325 MG PO TABS
650.0000 mg | ORAL_TABLET | Freq: Four times a day (QID) | ORAL | Status: DC | PRN
  Administered 2017-01-08: 650 mg via ORAL
  Filled 2017-01-07: qty 2

## 2017-01-07 MED ORDER — ONDANSETRON HCL 4 MG PO TABS: 4.0000 mg | ORAL_TABLET | Freq: Four times a day (QID) | ORAL | Status: DC | PRN

## 2017-01-07 MED ORDER — PANTOPRAZOLE SODIUM 40 MG IV SOLR
40.0000 mg | Freq: Once | INTRAVENOUS | Status: DC
  Filled 2017-01-07: qty 40

## 2017-01-07 MED ORDER — ROSUVASTATIN CALCIUM 10 MG PO TABS
10.0000 mg | ORAL_TABLET | Freq: Every day | ORAL | Status: DC
  Administered 2017-01-08 – 2017-01-09 (×2): 10 mg via ORAL
  Filled 2017-01-07 (×2): qty 1

## 2017-01-07 MED ORDER — ACETAMINOPHEN 650 MG RE SUPP: 650.0000 mg | Freq: Four times a day (QID) | RECTAL | Status: DC | PRN

## 2017-01-07 MED ORDER — SODIUM CHLORIDE 0.9 % IV SOLN
10.0000 mL/h | Freq: Once | INTRAVENOUS | Status: AC
  Administered 2017-01-08: 10 mL/h via INTRAVENOUS

## 2017-01-07 MED ORDER — SODIUM CHLORIDE 0.9 % IV SOLN
8.0000 mg/h | INTRAVENOUS | Status: DC
  Administered 2017-01-08: 8 mg/h via INTRAVENOUS
  Filled 2017-01-07 (×4): qty 80

## 2017-01-07 NOTE — ED Provider Notes (Signed)
[facility] EMERGENCY DEPARTMENT Provider Note   CSN: [id number] Arrival date & time: 01/07/17  1825     History   Chief Complaint Chief Complaint  Patient presents with  . Shortness of Breath    HPI Sierra Lucas is a 61 y.o. female.  HPI Patient presents with shortness of breath lightheadedness.Marland Kitchen  He is in town visiting family.  Used to live in [place] but now lives in [place].  She is the middle of a trip that will go up to [place] before going back down to [place].  Since around Thanksgiving with today being 6 days later she has had black stools.  She states no blood.  She is also had some left upper quadrant abdominal pain for a while now.  States that she has had a previous gastric bypass and is going to follow-up with a doctor in [place] about a revision.  No history of gastric ulcers.  No history of GI bleeds.  States that she felt the room spinning also and was worried she had vertigo again. Past Medical History:  Diagnosis Date  . Abnormal Pap smear 06/19/2005   ASCUS   . Headache(784.0)   . HGSIL (high grade squamous intraepithelial dysplasia) 2005  . Systemic hypertension 10/11/2013    Patient Active Problem List   Diagnosis Date Noted  . CAD (coronary artery disease) of artery bypass graft 10/11/2013  . Systemic hypertension 10/11/2013  . Hyperlipidemia 10/11/2013  . Hidradenitis suppurativa 10/06/2011  . Menopause 07/30/2010  . Wears glasses 07/30/2010  . Family history of breast cancer 07/30/2010    Past Surgical History:  Procedure Laterality Date  . BLADDER SURGERY  2004   Ask patient to clarify. Not on history form.  Marland Kitchen CARDIAC SURGERY  2000   Bypass surgery.  . CYSTECTOMY  06/11/10   REMOVED ON BUTTOCKS   . GALLBLADDER SURGERY  1981   Removal? Ask patient to clarify.  Marland Kitchen GASTRIC BYPASS  06/24/2012  . INGUINAL HIDRADENITIS EXCISION  2013  . NOVASURE ABLATION  07/18/2005  . TUBAL LIGATION      OB History    Gravida Para  Term Preterm AB Living   4 2 2   2 2    SAB TAB Ectopic Multiple Live Births   2       2       Home Medications    Prior to Admission medications   Medication Sig Start Date End Date Taking? Authorizing Provider  ALPRAZolam Prudy Feeler) 0.5 MG tablet Take 0.5 mg by mouth as needed for anxiety.  07/12/13  Yes [provider]  aspirin 81 MG tablet Take 81 mg by mouth daily.    Yes [provider]  Biotin 1 MG CAPS Take 1 mg by mouth daily.    Yes [provider]  hydrochlorothiazide (MICROZIDE) 12.5 MG capsule Take 1 capsule (12.5 mg total) by mouth daily. *Please keep 01/23/16 appointment* 01/10/16  Yes Lyn Records, MD  loratadine (CLARITIN) 10 MG tablet Take 10 mg by mouth daily. Ask patient to clarify dosage. Not indicated on history form dated 06/05/10.    Yes [provider]  rosuvastatin (CRESTOR) 10 MG tablet TAKE 1 TABLET BY MOUTH EVERY DAY 04/07/16  Yes Lyn Records, MD  venlafaxine XR (EFFEXOR-XR) 150 MG 24 hr capsule Take 150 mg by mouth daily. 09/06/14  Yes [provider]    Family History Family History  Problem Relation Age of Onset  . Heart disease Father  Social History Social History   Tobacco Use  . Smoking status: Never Smoker  . Smokeless tobacco: Never Used  Substance Use Topics  . Alcohol use: Yes    Comment: two per month.  . Drug use: No     Allergies   Iohexol; Latex; and Sulfa antibiotics   Review of Systems Review of Systems  Constitutional: Positive for appetite change and fatigue.  HENT: Negative for congestion.   Respiratory: Positive for shortness of breath.   Cardiovascular: Negative for chest pain.  Gastrointestinal: Positive for abdominal pain and blood in stool. Negative for nausea.  Genitourinary: Negative for frequency.  Musculoskeletal: Negative for back pain.  Skin: Negative for rash.  Neurological: Positive for light-headedness.  Hematological: Negative for adenopathy.    Psychiatric/Behavioral: Negative for confusion.     Physical Exam Updated Vital Signs BP 128/89   Pulse (!) 102   Temp 98.2 F (36.8 C) (Oral)   Resp 16   Ht 5\' 3"  (1.6 m)   Wt 89.8 kg (198 lb)   SpO2 100%   BMI 35.07 kg/m   Physical Exam  Constitutional: She appears well-developed.  HENT:  Head: Normocephalic.  Eyes: Pupils are equal, round, and reactive to light.  Cardiovascular: Regular rhythm.  Mild tachycardia  Pulmonary/Chest: Effort normal and breath sounds normal.  Genitourinary:  Genitourinary Comments: Rectal exam showed some brownish black stool with small areas of frank red blood.  Musculoskeletal: Normal range of motion.  Neurological: She is alert.  Skin: Capillary refill takes less than 2 seconds. There is pallor.     ED Treatments / Results  Labs (all labs ordered are listed, but only abnormal results are displayed) Labs Reviewed  BASIC METABOLIC PANEL - Abnormal; Notable for the following components:      Result Value   Potassium 3.3 (*)    Glucose, Bld 103 (*)    BUN 25 (*)    Calcium 8.5 (*)    All other components within normal limits  CBC - Abnormal; Notable for the following components:   WBC 14.4 (*)    RBC 2.42 (*)    Hemoglobin 7.1 (*)    HCT 21.7 (*)    RDW 16.0 (*)    All other components within normal limits  I-STAT BETA HCG BLOOD, ED ([facility], [facility], [facility] ONLY) - Abnormal; Notable for the following components:   I-stat hCG, quantitative 5.8 (*)    All other components within normal limits  TROPONIN I    EKG  EKG Interpretation  Date/Time:  Wednesday January 07 2017 18:39:34 EST Ventricular Rate:  113 PR Interval:  158 QRS Duration: 70 QT Interval:  338 QTC Calculation: 463 R Axis:   51 Text Interpretation:  Sinus tachycardia Nonspecific T wave abnormality Abnormal ECG Confirmed by Benjiman CorePickering, Claudina Oliphant [phone]([id number]) on 01/07/2017 9:26:51 PM       Radiology Dg Chest 2 View  Result Date: 01/07/2017 CLINICAL DATA:  Shortness of  breath EXAM: CHEST  2 VIEW COMPARISON:  04/28/2013 FINDINGS: Post sternotomy changes. Surgical clips at the GE junction. No acute consolidation or effusion. Cardiomediastinal silhouette within normal limits. No pneumothorax. IMPRESSION: No active cardiopulmonary disease. Electronically Signed   By: Jasmine PangKim  Fujinaga M.D.   On: 01/07/2017 20:21    Procedures Procedures (including critical care time)  Medications Ordered in ED Medications - No data to display   Initial Impression / Assessment and Plan / ED Course  I have reviewed the triage vital signs and the nursing notes.  Pertinent labs &  imaging results that were available during my care of the patient were reviewed by me and considered in my medical decision making (see chart for details).     Patient with GI bleed and anemia.  Symptomatic with shortness of breath and mild tachycardia.  Also was traveling.  Previous gastric bypass.  At this point I think the patient benefit from blood transfusion I have discussed with her.  Will admit to hospitalist.  Final Clinical Impressions(s) / ED Diagnoses   Final diagnoses:  Anemia, unspecified type  Gastrointestinal hemorrhage, unspecified gastrointestinal hemorrhage type    ED Discharge Orders    None       Benjiman CorePickering, Maninder Deboer, MD 01/07/17 2149

## 2017-01-07 NOTE — ED Triage Notes (Signed)
The pt is c/o sob for 2-3 days with exertion  No chest pain and she has had tachycardia

## 2017-01-07 NOTE — ED Notes (Addendum)
Pt stated that she is from out of town and has been up since 3am this morning driving here to visit family.  She used to live here and has an EDP she can still see.  I informed her of wait time and asked her to check with the nurse to make sure there were no major concerns regarding her labs.  Pt stated that she could not wait that long regardless.  I reassessed her vitals and encouraged her to come back if she got worse.  Pt. LWBS.

## 2017-01-07 NOTE — ED Triage Notes (Signed)
Pt returned my phone call stating she will come back to ED

## 2017-01-07 NOTE — ED Triage Notes (Signed)
Pt informed tech first she wanted to leave. Pt has hgb of 7.1. Called pt and left message informing her to please call ED back regarding her blood work.

## 2017-01-07 NOTE — ED Triage Notes (Signed)
Vertigo  With the sob also and nausea

## 2017-01-07 NOTE — H&P (Signed)
History and Physical    XIANA CARNS ZOX:[id number] DOB: 20-Dec-1955 DOA: 01/07/2017  PCP: System, Provider Not In  Patient coming from: Home  I have personally briefly reviewed patient's old medical records in [facility]  Chief Complaint: SOB  HPI: KAYLAH CHIASSON is a 61 y.o. 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  Patient lives in [place] now.  In town visiting family in [place].  Since Thanksgiving she has had black stools.  Also has LUQ abd pain for a while now.  No prior h/o GI bleeds.  SOB with any exertion for past 2-3 days.  Associated tachycardia, no chest pain.  Today developed vertigo and room spinning sensations.  Presents to ED.   ED Course: HGB 7.1, 2 unit PRBC transfusion ordered.  BUN 25, creat 0.62.   Review of Systems: As per HPI otherwise 10 point review of systems negative.   Past Medical History:  Diagnosis Date  . Abnormal Pap smear 06/19/2005   ASCUS   . Headache(784.0)   . HGSIL (high grade squamous intraepithelial dysplasia) 2005  . Systemic hypertension 10/11/2013    Past Surgical History:  Procedure Laterality Date  . BLADDER SURGERY  2004   Ask patient to clarify. Not on history form.  Marland Kitchen CARDIAC SURGERY  2000   Bypass surgery.  . CYSTECTOMY  06/11/10   REMOVED ON BUTTOCKS   . GALLBLADDER SURGERY  1981   Removal? Ask patient to clarify.  Marland Kitchen GASTRIC BYPASS  06/24/2012  . INGUINAL HIDRADENITIS EXCISION  2013  . NOVASURE ABLATION  07/18/2005  . TUBAL LIGATION       reports that  has never smoked. she has never used smokeless tobacco. She reports that she drinks alcohol. She reports that she does not use drugs.  Allergies  Allergen Reactions  . Iohexol      Code: RASH, Desc: PT NEEDS 13 HOUR PREP pt had prior contrast yrs ago W/ itching.   50 mg benadryl po 15 mins before scan.  she still had itching of mouth, lips, throat and breast. needs po benadryl 1 hr prior per dr. Denny Levy, Onset  Date: [id number]   . Latex     Ask patient to clarify type/severity/reaction. Not indicated on history form dated 06/05/10.  . Sulfa Antibiotics     Ask patient to clarify type/severity/reaction. Not indicated on medical history form dated 06/05/10.    Family History  Problem Relation Age of Onset  . Heart disease Father      Prior to Admission medications   Medication Sig Start Date End Date Taking? Authorizing Provider  ALPRAZolam Prudy Feeler) 0.5 MG tablet Take 0.5 mg by mouth as needed for anxiety.  07/12/13  Yes [provider]  aspirin 81 MG tablet Take 81 mg by mouth daily.    Yes [provider]  Biotin 1 MG CAPS Take 1 mg by mouth daily.    Yes [provider]  hydrochlorothiazide (MICROZIDE) 12.5 MG capsule Take 1 capsule (12.5 mg total) by mouth daily. *Please keep 01/23/16 appointment* 01/10/16  Yes Lyn Records, MD  loratadine (CLARITIN) 10 MG tablet Take 10 mg by mouth daily. Ask patient to clarify dosage. Not indicated on history form dated 06/05/10.    Yes [provider]  rosuvastatin (CRESTOR) 10 MG tablet TAKE 1 TABLET BY MOUTH EVERY DAY 04/07/16  Yes Lyn Records, MD  venlafaxine XR (EFFEXOR-XR) 150 MG 24 hr capsule Take 150 mg by mouth  daily. 09/06/14  Yes [provider]    Physical Exam: Vitals:   01/07/17 1846 01/07/17 1848 01/07/17 2042  BP:  (!) 133/94 128/89  Pulse:  (!) 101 (!) 102  Resp:  18 16  Temp:  98.2 F (36.8 C) 98.2 F (36.8 C)  TempSrc:   Oral  SpO2:  100% 100%  Weight: 89.8 kg (198 lb)    Height: 5\' 3"  (1.6 m)      Constitutional: NAD, calm, comfortable Eyes: PERRL, lids and conjunctivae normal ENMT: Mucous membranes are moist. Posterior pharynx clear of any exudate or lesions.Normal dentition.  Neck: normal, supple, no masses, no thyromegaly Respiratory: clear to auscultation bilaterally, no wheezing, no crackles. Normal respiratory effort. No accessory muscle use.  Cardiovascular: Regular rate and  rhythm, no murmurs / rubs / gallops. No extremity edema. 2+ pedal pulses. No carotid bruits.  Abdomen: no tenderness, no masses palpated. No hepatosplenomegaly. Bowel sounds positive.  Musculoskeletal: no clubbing / cyanosis. No joint deformity upper and lower extremities. Good ROM, no contractures. Normal muscle tone.  Skin: no rashes, lesions, ulcers. No induration Neurologic: CN 2-12 grossly intact. Sensation intact, DTR normal. Strength 5/5 in all 4.  Psychiatric: Normal judgment and insight. Alert and oriented x 3. Normal mood.    Labs on Admission: I have personally reviewed following labs and imaging studies  CBC: Recent Labs  Lab 01/07/17 1838  WBC 14.4*  HGB 7.1*  HCT 21.7*  MCV 89.7  PLT 287   Basic Metabolic Panel: Recent Labs  Lab 01/07/17 1838  NA 136  K 3.3*  CL 104  CO2 23  GLUCOSE 103*  BUN 25*  CREATININE 0.62  CALCIUM 8.5*   GFR: Estimated Creatinine Clearance: 80.6 mL/min (by C-G formula based on SCr of 0.62 mg/dL). Liver Function Tests: No results for input(s): AST, ALT, ALKPHOS, BILITOT, PROT, ALBUMIN in the last 168 hours. No results for input(s): LIPASE, AMYLASE in the last 168 hours. No results for input(s): AMMONIA in the last 168 hours. Coagulation Profile: No results for input(s): INR, PROTIME in the last 168 hours. Cardiac Enzymes: Recent Labs  Lab 01/07/17 1838  TROPONINI <0.03   BNP (last 3 results) No results for input(s): PROBNP in the last 8760 hours. HbA1C: No results for input(s): HGBA1C in the last 72 hours. CBG: No results for input(s): GLUCAP in the last 168 hours. Lipid Profile: No results for input(s): CHOL, HDL, LDLCALC, TRIG, CHOLHDL, LDLDIRECT in the last 72 hours. Thyroid Function Tests: No results for input(s): TSH, T4TOTAL, FREET4, T3FREE, THYROIDAB in the last 72 hours. Anemia Panel: No results for input(s): VITAMINB12, FOLATE, FERRITIN, TIBC, IRON, RETICCTPCT in the last 72 hours. Urine analysis:    Component  Value Date/Time   COLORURINE YELLOW 04/28/2013 0423   APPEARANCEUR CLOUDY (A) 04/28/2013 0423   LABSPEC 1.025 04/28/2013 0423   PHURINE 5.0 04/28/2013 0423   GLUCOSEU NEGATIVE 04/28/2013 0423   HGBUR NEGATIVE 04/28/2013 0423   BILIRUBINUR NEGATIVE 04/28/2013 0423   KETONESUR NEGATIVE 04/28/2013 0423   PROTEINUR NEGATIVE 04/28/2013 0423   UROBILINOGEN 0.2 04/28/2013 0423   NITRITE NEGATIVE 04/28/2013 0423   LEUKOCYTESUR MODERATE (A) 04/28/2013 0423    Radiological Exams on Admission: Dg Chest 2 View  Result Date: 01/07/2017 CLINICAL DATA:  Shortness of breath EXAM: CHEST  2 VIEW COMPARISON:  04/28/2013 FINDINGS: Post sternotomy changes. Surgical clips at the GE junction. No acute consolidation or effusion. Cardiomediastinal silhouette within normal limits. No pneumothorax. IMPRESSION: No active cardiopulmonary disease. Electronically Signed  By: Jasmine PangKim  Fujinaga M.D.   On: 01/07/2017 20:21    EKG: Independently reviewed.  Assessment/Plan Principal Problem:   Gastrointestinal hemorrhage with melena Active Problems:   Acute blood loss anemia    1. Melena, and acute blood loss anemia - 1. EDP spoke with Dr. Randa EvensEdwards 2. He wants us to get an NM blood loss scan in the AM 3. PPI GTT 4. Presumably he will see and decide on EGD in AM.  May or may not be able to get to bleed site due to Roux-en-y anatomy. 5. NPO after MN 6. 2 unit PRBC transfusion ordered 7. Repeat CBC in AM  DVT prophylaxis: SCDs Code Status: Full Family Communication: No family in room Disposition Plan: Home after admit Consults called: Dr. Randa EvensEdwards with GI Admission status: Admit to inpatient   Hillary BowGARDNER, Jennifermarie Franzen M. DO [facility] Pager [phone]  If 7AM-7PM, please contact day team taking care of patient [email or web address] Password [email or web address]  01/07/2017, 10:36 PM

## 2017-01-08 ENCOUNTER — Encounter (HOSPITAL_COMMUNITY)
Admission: EM | Disposition: A | Discharge status: Home / Self Care | Payer: Self-pay | Source: Home / Self Care | Attending: Emergency Medicine

## 2017-01-08 ENCOUNTER — Encounter (HOSPITAL_COMMUNITY): Payer: Self-pay | Admitting: *Deleted

## 2017-01-08 ENCOUNTER — Inpatient Hospital Stay (HOSPITAL_COMMUNITY): Payer: 59 | Admitting: Anesthesiology

## 2017-01-08 DIAGNOSIS — K921 Melena: Secondary | ICD-10-CM | POA: Diagnosis not present

## 2017-01-08 DIAGNOSIS — K922 Gastrointestinal hemorrhage, unspecified: Secondary | ICD-10-CM

## 2017-01-08 HISTORY — DX: Gastrointestinal hemorrhage, unspecified: K92.2

## 2017-01-08 HISTORY — PX: ESOPHAGOGASTRODUODENOSCOPY (EGD) WITH PROPOFOL: SHX5813

## 2017-01-08 LAB — CBC
HCT: 26.2 % — ABNORMAL LOW (ref 36.0–46.0)
HEMATOCRIT: 23.9 % — AB (ref 36.0–46.0)
HEMOGLOBIN: 8 g/dL — AB (ref 12.0–15.0)
Hemoglobin: 8.6 g/dL — ABNORMAL LOW (ref 12.0–15.0)
MCH: 29.6 pg (ref 26.0–34.0)
MCH: 29.4 pg (ref 26.0–34.0)
MCHC: 33.5 g/dL (ref 30.0–36.0)
MCHC: 32.8 g/dL (ref 30.0–36.0)
MCV: 88.5 fL (ref 78.0–100.0)
MCV: 89.4 fL (ref 78.0–100.0)
Platelets: 191 10*3/uL (ref 150–400)
Platelets: 217 10*3/uL (ref 150–400)
RBC: 2.7 MIL/uL — ABNORMAL LOW (ref 3.87–5.11)
RBC: 2.93 MIL/uL — ABNORMAL LOW (ref 3.87–5.11)
RDW: 14.9 % (ref 11.5–15.5)
RDW: 15.5 % (ref 11.5–15.5)
WBC: 8.8 10*3/uL (ref 4.0–10.5)
WBC: 11.9 10*3/uL — AB (ref 4.0–10.5)

## 2017-01-08 SURGERY — ESOPHAGOGASTRODUODENOSCOPY (EGD) WITH PROPOFOL
Anesthesia: Monitor Anesthesia Care | Laterality: Left

## 2017-01-08 MED ORDER — DEXAMETHASONE SODIUM PHOSPHATE 10 MG/ML IJ SOLN
INTRAMUSCULAR | Status: DC | PRN
  Administered 2017-01-08: 5 mg via INTRAVENOUS

## 2017-01-08 MED ORDER — PROPOFOL 10 MG/ML IV BOLUS
INTRAVENOUS | Status: DC | PRN
  Administered 2017-01-08: 30 mg via INTRAVENOUS

## 2017-01-08 MED ORDER — ALPRAZOLAM 0.5 MG PO TABS: 0.5000 mg | ORAL_TABLET | Freq: Three times a day (TID) | ORAL | Status: DC | PRN

## 2017-01-08 MED ORDER — LACTATED RINGERS IV SOLN
INTRAVENOUS | Status: DC | PRN
  Administered 2017-01-08: 15:00:00 via INTRAVENOUS

## 2017-01-08 MED ORDER — MORPHINE SULFATE (PF) 4 MG/ML IV SOLN: 2.0000 mg | INTRAVENOUS | Status: DC | PRN

## 2017-01-08 MED ORDER — BUTAMBEN-TETRACAINE-BENZOCAINE 2-2-14 % EX AERO
INHALATION_SPRAY | CUTANEOUS | Status: DC | PRN
  Administered 2017-01-08: 2 via TOPICAL

## 2017-01-08 MED ORDER — PROPOFOL 500 MG/50ML IV EMUL
INTRAVENOUS | Status: DC | PRN
  Administered 2017-01-08: 75 ug/kg/min via INTRAVENOUS

## 2017-01-08 MED ORDER — PANTOPRAZOLE SODIUM 40 MG IV SOLR
40.0000 mg | Freq: Two times a day (BID) | INTRAVENOUS | Status: DC
  Administered 2017-01-09: 40 mg via INTRAVENOUS
  Filled 2017-01-08: qty 40

## 2017-01-08 MED ORDER — ZOLPIDEM TARTRATE 5 MG PO TABS
5.0000 mg | ORAL_TABLET | Freq: Every evening | ORAL | Status: DC | PRN
  Administered 2017-01-08: 5 mg via ORAL
  Filled 2017-01-08: qty 1

## 2017-01-08 MED ORDER — ONDANSETRON HCL 4 MG/2ML IJ SOLN
INTRAMUSCULAR | Status: DC | PRN
  Administered 2017-01-08: 4 mg via INTRAVENOUS

## 2017-01-08 MED ORDER — SODIUM CHLORIDE 0.9 % IV SOLN: INTRAVENOUS | Status: DC

## 2017-01-08 MED ORDER — SODIUM CHLORIDE 0.9 % IV SOLN
8.0000 mg/h | INTRAVENOUS | Status: DC
  Administered 2017-01-09: 8 mg/h via INTRAVENOUS
  Filled 2017-01-08 (×3): qty 80

## 2017-01-08 MED ORDER — LIDOCAINE 2% (20 MG/ML) 5 ML SYRINGE
INTRAMUSCULAR | Status: DC | PRN
  Administered 2017-01-08: 80 mg via INTRAVENOUS

## 2017-01-08 NOTE — Transfer of Care (Signed)
Immediate Anesthesia Transfer of Care Note  Patient: Sierra Lucas  Procedure(s) Performed: ESOPHAGOGASTRODUODENOSCOPY (EGD) WITH PROPOFOL (Left )  Patient Location: PACU and Endoscopy Unit  Anesthesia Type:MAC  Level of Consciousness: awake, alert , oriented and patient cooperative  Airway & Oxygen Therapy: Patient Spontanous Breathing and Patient connected to nasal cannula oxygen  Post-op Assessment: Report given to RN, Post -op Vital signs reviewed and stable and Patient moving all extremities  Post vital signs: Reviewed and stable  Last Vitals:  Vitals:   01/08/17 1419 01/08/17 1504  BP: 111/66   Pulse: 87   Resp: 18   Temp:  36.7 C  SpO2: 100%     Last Pain:  Vitals:   01/08/17 1504  TempSrc: Oral  PainSc:          Complications: No apparent anesthesia complications

## 2017-01-08 NOTE — ED Notes (Signed)
Pt educated about use of NSAIDs and rectal bleeding. Pt offered 2mg  morphine IV, pt declines at this time d/t possibility of making headache worse.

## 2017-01-08 NOTE — Consult Note (Addendum)
[facility] Consult  Referring Provider: Hillary Bow, DO Primary Care Physician:  System, Provider Not In Primary Gastroenterologist:Dr.Ganem  Reason for Consultation:  Melena and symptomatic anemia  HPI: Sierra Lucas is a 61 y.o. female was in his usual state of health until Thursday last week when she noticed black tarry stools. As this continued for the next 3 days, she noticed progressively worsening shortness of breath along with dizziness. She initially thought this was related to vertigo, however, as she got progressively weak and appeared paler than usual she decided to proceed to the ER. For the last 7 days, patient reports her stools are black and sticky, denies rectal bleeding or noticing fresh blood in stool. She rarely takes NSAIDs, however 5 days ago reports taking 2 pills of Advil for generalized body ache. Patient takes a baby aspirin at home. 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 Last colonoscopy was performed in 2011 by Dr. Evette Cristal from screening and was reported as unremarkable(repeat recommended in 10 years). Patient lost 50 pounds after her bypass surgery, but gained 18 pounds back within a year. She used to be a on a PPI on a regular basis prior to bypass, but as she had no symptoms of acid reflux or regurgitation, has not being on a PPI since. She denies difficulty swallowing or pain on swallowing.  Patient also complains of pain under her left rib cage which has been present since 2011.Outpatient workup in 2011 showed an unremarkable upper GI series and a small bowel follow-through, except for a small hiatal hernia. CAT scan performed during that time for left upper quadrant pain was unremarkable and showed changes of cholecystectomy.  Past Medical History:  Diagnosis Date  . Abnormal Pap smear 06/19/2005   ASCUS   . Headache(784.0)   . HGSIL (high grade squamous intraepithelial dysplasia) 2005  . Systemic  hypertension 10/11/2013    Past Surgical History:  Procedure Laterality Date  . BLADDER SURGERY  2004   Ask patient to clarify. Not on history form.  Marland Kitchen CARDIAC SURGERY  2000   Bypass surgery.  . CYSTECTOMY  06/11/10   REMOVED ON BUTTOCKS   . GALLBLADDER SURGERY  1981   Removal? Ask patient to clarify.  Marland Kitchen GASTRIC BYPASS  06/24/2012  . INGUINAL HIDRADENITIS EXCISION  2013  . NOVASURE ABLATION  07/18/2005  . TUBAL LIGATION      Prior to Admission medications   Medication Sig Start Date End Date Taking? Authorizing Provider  ALPRAZolam Prudy Feeler) 0.5 MG tablet Take 0.5 mg by mouth as needed for anxiety.  07/12/13  Yes [provider]  aspirin 81 MG tablet Take 81 mg by mouth daily.    Yes [provider]  Biotin 1 MG CAPS Take 1 mg by mouth daily.    Yes [provider]  hydrochlorothiazide (MICROZIDE) 12.5 MG capsule Take 1 capsule (12.5 mg total) by mouth daily. *Please keep 01/23/16 appointment* 01/10/16  Yes Lyn Records, MD  loratadine (CLARITIN) 10 MG tablet Take 10 mg by mouth daily. Ask patient to clarify dosage. Not indicated on history form dated 06/05/10.    Yes [provider]  rosuvastatin (CRESTOR) 10 MG tablet TAKE 1 TABLET BY MOUTH EVERY DAY 04/07/16  Yes Lyn Records, MD  venlafaxine XR (EFFEXOR-XR) 150 MG 24 hr capsule Take 150 mg by mouth daily. 09/06/14  Yes [provider]    Current Facility-Administered Medications  Medication Dose Route Frequency Provider Last  Rate Last Dose  . acetaminophen (TYLENOL) tablet 650 mg  650 mg Oral Q6H PRN Hillary BowGardner, Jared M, DO   650 mg at 01/08/17 [id number]   Or  . acetaminophen (TYLENOL) suppository 650 mg  650 mg Rectal Q6H PRN Hillary BowGardner, Jared M, DO      . ALPRAZolam Prudy Feeler(XANAX) tablet 0.5 mg  0.5 mg Oral PRN Hillary BowGardner, Jared M, DO      . morphine 4 MG/ML injection 2 mg  2 mg Intravenous Q4H PRN Hillary BowGardner, Jared M, DO      . ondansetron [facility](ZOFRAN) tablet 4 mg  4 mg Oral Q6H PRN Hillary BowGardner, Jared M, DO       Or   . ondansetron [facility](ZOFRAN) injection 4 mg  4 mg Intravenous Q6H PRN Hillary BowGardner, Jared M, DO      . pantoprazole (PROTONIX) 80 mg in sodium chloride 0.9 % 250 mL (0.32 mg/mL) infusion  8 mg/hr Intravenous Continuous Benjiman CorePickering, Nathan, MD 25 mL/hr at 01/08/17 0027 8 mg/hr at 01/08/17 0027  . [START ON 01/11/2017] pantoprazole (PROTONIX) injection 40 mg  40 mg Intravenous Q12H Benjiman CorePickering, Nathan, MD      . rosuvastatin (CRESTOR) tablet 10 mg  10 mg Oral Daily Julian ReilGardner, Jared M, DO      . venlafaxine XR (EFFEXOR-XR) 24 hr capsule 150 mg  150 mg Oral Daily Hillary BowGardner, Jared M, DO       Current Outpatient Medications  Medication Sig Dispense Refill  . ALPRAZolam (XANAX) 0.5 MG tablet Take 0.5 mg by mouth as needed for anxiety.     Marland Kitchen. aspirin 81 MG tablet Take 81 mg by mouth daily.     . Biotin 1 MG CAPS Take 1 mg by mouth daily.     . hydrochlorothiazide (MICROZIDE) 12.5 MG capsule Take 1 capsule (12.5 mg total) by mouth daily. *Please keep 01/23/16 appointment* 30 capsule 0  . loratadine (CLARITIN) 10 MG tablet Take 10 mg by mouth daily. Ask patient to clarify dosage. Not indicated on history form dated 06/05/10.     Marland Kitchen. rosuvastatin (CRESTOR) 10 MG tablet TAKE 1 TABLET BY MOUTH EVERY DAY 30 tablet 0  . venlafaxine XR (EFFEXOR-XR) 150 MG 24 hr capsule Take 150 mg by mouth daily.      Allergies as of 01/07/2017 - Review Complete 01/07/2017  Allergen Reaction Noted  . Iohexol  02/17/2007  . Latex  07/30/2010  . Sulfa antibiotics  07/30/2010    Family History  Problem Relation Age of Onset  . Heart disease Father     Social History   Socioeconomic History  . Marital status: Married    Spouse name: Not on file  . Number of children: Not on file  . Years of education: Not on file  . Highest education level: Not on file  Social Needs  . Financial resource strain: Not on file  . Food insecurity - worry: Not on file  . Food insecurity - inability: Not on file  . Transportation needs - medical: Not on  file  . Transportation needs - non-medical: Not on file  Occupational History  . Not on file  Tobacco Use  . Smoking status: Never Smoker  . Smokeless tobacco: Never Used  Substance and Sexual Activity  . Alcohol use: Yes    Comment: two per month.  . Drug use: No  . Sexual activity: Yes    Birth control/protection: Surgical    Comment: BTL/ABLATION   Other Topics Concern  . Not on file  Social History Narrative  .  Not on file    Review of Systems: Positive for: GI: Described in detail in HPI.    Gen: fatigue, weakness, malaise,  Denies any fever, chills, rigors, night sweats, anorexia,involuntary weight loss, and sleep disorder CV: Denies chest pain, angina, palpitations, syncope, orthopnea, PND, peripheral edema, and claudication. Resp: dyspnea,Denies  cough, sputum, wheezing, coughing up blood. GU : Denies urinary burning, blood in urine, urinary frequency, urinary hesitancy, nocturnal urination, and urinary incontinence. MS: Denies joint pain or swelling.  Denies muscle weakness, cramps, atrophy.  Derm: Denies rash, itching, oral ulcerations, hives, unhealing ulcers.  Psych: Denies depression, anxiety, memory loss, suicidal ideation, hallucinations,  and confusion. Heme: ,bleeding,Denies bruising and enlarged lymph nodes. Neuro:  dizziness,Denies any headaches, paresthesias. Endo:  Denies any problems with DM, thyroid, adrenal function.  Physical Exam: Vital signs in last 24 hours: Temp:  [97.6 F (36.4 C)-98.2 F (36.8 C)] 98.2 F (36.8 C) (11/29 0726) Pulse Rate:  [77-114] 80 (11/29 0800) Resp:  [12-28] 17 (11/29 0800) BP: (100-133)/(45-94) 100/55 (11/29 0800) SpO2:  [98 %-100 %] 100 % (11/29 0800) Weight:  [89.8 kg (198 lb)] 89.8 kg (198 lb) (11/28 1846)    General:   Alert,  Well-developed, well-nourished, pleasant and cooperative in NAD Head:  Normocephalic and atraumatic. Eyes:  Sclera clear, no icterus.   Obvious pallor Ears:  Normal auditory acuity. Nose:   No deformity, discharge,  or lesions. Mouth:  No deformity or lesions.  Oropharynx pink & moist. Neck:  Supple; no masses or thyromegaly. Lungs:  Clear throughout to auscultation.   No wheezes, crackles, or rhonchi. No acute distress. Heart:  Regular rate and rhythm; no murmurs, clicks, rubs,  or gallops. Extremities:  Without clubbing or edema. Neurologic:  Alert and  oriented x4;  grossly normal neurologically. Skin:  Intact without significant lesions or rashes. Psych:  Alert and cooperative. Normal mood and affect. Abdomen:  Surgical scar noted ,Soft, nontender and nondistended. No masses, hepatosplenomegaly or hernias noted. Normal bowel sounds, without guarding, and without rebound.         Lab Results: Recent Labs    01/07/17 1838  WBC 14.4*  HGB 7.1*  HCT 21.7*  PLT 287   BMET Recent Labs    01/07/17 1838  NA 136  K 3.3*  CL 104  CO2 23  GLUCOSE 103*  BUN 25*  CREATININE 0.62  CALCIUM 8.5*   LFT Recent Labs    01/07/17 2225  PROT 6.1*  ALBUMIN 3.2*  AST 28  ALT 21  ALKPHOS 51  BILITOT 0.3  BILIDIR 0.1  IBILI 0.2*   PT/INR Recent Labs    01/07/17 2225  LABPROT 13.0  INR 0.99    Studies/Results: Dg Chest 2 View  Result Date: 01/07/2017 CLINICAL DATA:  Shortness of breath EXAM: CHEST  2 VIEW COMPARISON:  04/28/2013 FINDINGS: Post sternotomy changes. Surgical clips at the GE junction. No acute consolidation or effusion. Cardiomediastinal silhouette within normal limits. No pneumothorax. IMPRESSION: No active cardiopulmonary disease. Electronically Signed   By: Jasmine PangKim  Fujinaga M.D.   On: 01/07/2017 20:21    Impression: 1. Melena 2. Symptomatic anemia(baseline hemoglobin 14.2, 7.1 on admission) 3. Roux-en-Y gastric bypass surgery  Plan: Plan EGD today afternoon. ? Anastomotic ulcer, AVM, ischemic ulcer. If endoscopy is unremarkable, consider possibility of a small bowel bleed, due to gastric bypass placement of video capsule endoscopy may not  be possible. If endoscopy is negative recommend a bleeding scan, may need enteroscopy at a tertiary center if endoscopy is  unremarkable. 2 units PRBC transfused.  Currently on IV Protonix at 8 mg per hour. Hemodynamically stable.   LOS: 1 day   Kerin Salen, M.D.  01/08/2017, 8:35 AM  Pager [phone] If no answer or after 5 PM call [phone]

## 2017-01-08 NOTE — ED Notes (Signed)
Pt ambulatory to restroom. Pt reports feeling less dizzy than before.

## 2017-01-08 NOTE — Brief Op Note (Signed)
01/07/2017 - 01/08/2017  3:09 PM  PATIENT:  Sierra Lucas  61 y.o. female  PRE-OPERATIVE DIAGNOSIS:  Melena, anemia, history of gastric bypass  POST-OPERATIVE DIAGNOSIS:  anastomotic ulcer  PROCEDURE:  Procedure(s): ESOPHAGOGASTRODUODENOSCOPY (EGD) WITH PROPOFOL (Left)  SURGEON:  Surgeon(s) and Role:    Ronnette Juniper, MD - Primary  PHYSICIAN ASSISTANT:   ASSISTANTS:Donna Pickering, RN, Janie Billups  ANESTHESIA:   MAC  EBL:  none  BLOOD ADMINISTERED:none  DRAINS: none   LOCAL MEDICATIONS USED:  NONE  SPECIMEN:  No Specimen  DISPOSITION OF SPECIMEN:  N/A  COUNTS:  YES  TOURNIQUET:  * No tourniquets in log *  DICTATION: .Dragon Dictation  PLAN OF CARE: Admit to inpatient   PATIENT DISPOSITION:  PACU - hemodynamically stable.   Delay start of Pharmacological VTE agent (>24hrs) due to surgical blood loss or risk of bleeding: yes

## 2017-01-08 NOTE — ED Notes (Signed)
Admit Doctor at bedside.  

## 2017-01-08 NOTE — ED Notes (Signed)
Pt tolerating transfusion well. Pt given ice chips.

## 2017-01-08 NOTE — ED Notes (Signed)
Patient stated she did not take her Effexor medication yesterday or today and states that is the medication she needs to take in order for her not to become sick.

## 2017-01-08 NOTE — H&P (View-Only) (Signed)
[facility] Consult  Referring Provider: Hillary Bow, DO Primary Care Physician:  System, Provider Not In Primary Gastroenterologist:Dr.Ganem  Reason for Consultation:  Melena and symptomatic anemia  HPI: Sierra Lucas is a 61 y.o. female was in his usual state of health until Thursday last week when she noticed black tarry stools. As this continued for the next 3 days, she noticed progressively worsening shortness of breath along with dizziness. She initially thought this was related to vertigo, however, as she got progressively weak and appeared paler than usual she decided to proceed to the ER. For the last 7 days, patient reports her stools are black and sticky, denies rectal bleeding or noticing fresh blood in stool. She rarely takes NSAIDs, however 5 days ago reports taking 2 pills of Advil for generalized body ache. Patient takes a baby aspirin at home. 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 Last colonoscopy was performed in 2011 by Dr. Evette Cristal from screening and was reported as unremarkable(repeat recommended in 10 years). Patient lost 50 pounds after her bypass surgery, but gained 18 pounds back within a year. She used to be a on a PPI on a regular basis prior to bypass, but as she had no symptoms of acid reflux or regurgitation, has not being on a PPI since. She denies difficulty swallowing or pain on swallowing.  Patient also complains of pain under her left rib cage which has been present since 2011.Outpatient workup in 2011 showed an unremarkable upper GI series and a small bowel follow-through, except for a small hiatal hernia. CAT scan performed during that time for left upper quadrant pain was unremarkable and showed changes of cholecystectomy.  Past Medical History:  Diagnosis Date  . Abnormal Pap smear 06/19/2005   ASCUS   . Headache(784.0)   . HGSIL (high grade squamous intraepithelial dysplasia) 2005  . Systemic  hypertension 10/11/2013    Past Surgical History:  Procedure Laterality Date  . BLADDER SURGERY  2004   Ask patient to clarify. Not on history form.  Marland Kitchen CARDIAC SURGERY  2000   Bypass surgery.  . CYSTECTOMY  06/11/10   REMOVED ON BUTTOCKS   . GALLBLADDER SURGERY  1981   Removal? Ask patient to clarify.  Marland Kitchen GASTRIC BYPASS  06/24/2012  . INGUINAL HIDRADENITIS EXCISION  2013  . NOVASURE ABLATION  07/18/2005  . TUBAL LIGATION      Prior to Admission medications   Medication Sig Start Date End Date Taking? Authorizing Provider  ALPRAZolam Prudy Feeler) 0.5 MG tablet Take 0.5 mg by mouth as needed for anxiety.  07/12/13  Yes [provider]  aspirin 81 MG tablet Take 81 mg by mouth daily.    Yes [provider]  Biotin 1 MG CAPS Take 1 mg by mouth daily.    Yes [provider]  hydrochlorothiazide (MICROZIDE) 12.5 MG capsule Take 1 capsule (12.5 mg total) by mouth daily. *Please keep 01/23/16 appointment* 01/10/16  Yes Lyn Records, MD  loratadine (CLARITIN) 10 MG tablet Take 10 mg by mouth daily. Ask patient to clarify dosage. Not indicated on history form dated 06/05/10.    Yes [provider]  rosuvastatin (CRESTOR) 10 MG tablet TAKE 1 TABLET BY MOUTH EVERY DAY 04/07/16  Yes Lyn Records, MD  venlafaxine XR (EFFEXOR-XR) 150 MG 24 hr capsule Take 150 mg by mouth daily. 09/06/14  Yes [provider]    Current Facility-Administered Medications  Medication Dose Route Frequency Provider Last  Rate Last Dose  . acetaminophen (TYLENOL) tablet 650 mg  650 mg Oral Q6H PRN Hillary BowGardner, Jared M, DO   650 mg at 01/08/17 [id number]   Or  . acetaminophen (TYLENOL) suppository 650 mg  650 mg Rectal Q6H PRN Hillary BowGardner, Jared M, DO      . ALPRAZolam Prudy Feeler(XANAX) tablet 0.5 mg  0.5 mg Oral PRN Hillary BowGardner, Jared M, DO      . morphine 4 MG/ML injection 2 mg  2 mg Intravenous Q4H PRN Hillary BowGardner, Jared M, DO      . ondansetron [facility](ZOFRAN) tablet 4 mg  4 mg Oral Q6H PRN Hillary BowGardner, Jared M, DO       Or   . ondansetron [facility](ZOFRAN) injection 4 mg  4 mg Intravenous Q6H PRN Hillary BowGardner, Jared M, DO      . pantoprazole (PROTONIX) 80 mg in sodium chloride 0.9 % 250 mL (0.32 mg/mL) infusion  8 mg/hr Intravenous Continuous Benjiman CorePickering, Nathan, MD 25 mL/hr at 01/08/17 0027 8 mg/hr at 01/08/17 0027  . [START ON 01/11/2017] pantoprazole (PROTONIX) injection 40 mg  40 mg Intravenous Q12H Benjiman CorePickering, Nathan, MD      . rosuvastatin (CRESTOR) tablet 10 mg  10 mg Oral Daily Julian ReilGardner, Jared M, DO      . venlafaxine XR (EFFEXOR-XR) 24 hr capsule 150 mg  150 mg Oral Daily Hillary BowGardner, Jared M, DO       Current Outpatient Medications  Medication Sig Dispense Refill  . ALPRAZolam (XANAX) 0.5 MG tablet Take 0.5 mg by mouth as needed for anxiety.     Marland Kitchen. aspirin 81 MG tablet Take 81 mg by mouth daily.     . Biotin 1 MG CAPS Take 1 mg by mouth daily.     . hydrochlorothiazide (MICROZIDE) 12.5 MG capsule Take 1 capsule (12.5 mg total) by mouth daily. *Please keep 01/23/16 appointment* 30 capsule 0  . loratadine (CLARITIN) 10 MG tablet Take 10 mg by mouth daily. Ask patient to clarify dosage. Not indicated on history form dated 06/05/10.     Marland Kitchen. rosuvastatin (CRESTOR) 10 MG tablet TAKE 1 TABLET BY MOUTH EVERY DAY 30 tablet 0  . venlafaxine XR (EFFEXOR-XR) 150 MG 24 hr capsule Take 150 mg by mouth daily.      Allergies as of 01/07/2017 - Review Complete 01/07/2017  Allergen Reaction Noted  . Iohexol  02/17/2007  . Latex  07/30/2010  . Sulfa antibiotics  07/30/2010    Family History  Problem Relation Age of Onset  . Heart disease Father     Social History   Socioeconomic History  . Marital status: Married    Spouse name: Not on file  . Number of children: Not on file  . Years of education: Not on file  . Highest education level: Not on file  Social Needs  . Financial resource strain: Not on file  . Food insecurity - worry: Not on file  . Food insecurity - inability: Not on file  . Transportation needs - medical: Not on  file  . Transportation needs - non-medical: Not on file  Occupational History  . Not on file  Tobacco Use  . Smoking status: Never Smoker  . Smokeless tobacco: Never Used  Substance and Sexual Activity  . Alcohol use: Yes    Comment: two per month.  . Drug use: No  . Sexual activity: Yes    Birth control/protection: Surgical    Comment: BTL/ABLATION   Other Topics Concern  . Not on file  Social History Narrative  .  Not on file    Review of Systems: Positive for: GI: Described in detail in HPI.    Gen: fatigue, weakness, malaise,  Denies any fever, chills, rigors, night sweats, anorexia,involuntary weight loss, and sleep disorder CV: Denies chest pain, angina, palpitations, syncope, orthopnea, PND, peripheral edema, and claudication. Resp: dyspnea,Denies  cough, sputum, wheezing, coughing up blood. GU : Denies urinary burning, blood in urine, urinary frequency, urinary hesitancy, nocturnal urination, and urinary incontinence. MS: Denies joint pain or swelling.  Denies muscle weakness, cramps, atrophy.  Derm: Denies rash, itching, oral ulcerations, hives, unhealing ulcers.  Psych: Denies depression, anxiety, memory loss, suicidal ideation, hallucinations,  and confusion. Heme: ,bleeding,Denies bruising and enlarged lymph nodes. Neuro:  dizziness,Denies any headaches, paresthesias. Endo:  Denies any problems with DM, thyroid, adrenal function.  Physical Exam: Vital signs in last 24 hours: Temp:  [97.6 F (36.4 C)-98.2 F (36.8 C)] 98.2 F (36.8 C) (11/29 0726) Pulse Rate:  [77-114] 80 (11/29 0800) Resp:  [12-28] 17 (11/29 0800) BP: (100-133)/(45-94) 100/55 (11/29 0800) SpO2:  [98 %-100 %] 100 % (11/29 0800) Weight:  [89.8 kg (198 lb)] 89.8 kg (198 lb) (11/28 1846)    General:   Alert,  Well-developed, well-nourished, pleasant and cooperative in NAD Head:  Normocephalic and atraumatic. Eyes:  Sclera clear, no icterus.   Obvious pallor Ears:  Normal auditory acuity. Nose:   No deformity, discharge,  or lesions. Mouth:  No deformity or lesions.  Oropharynx pink & moist. Neck:  Supple; no masses or thyromegaly. Lungs:  Clear throughout to auscultation.   No wheezes, crackles, or rhonchi. No acute distress. Heart:  Regular rate and rhythm; no murmurs, clicks, rubs,  or gallops. Extremities:  Without clubbing or edema. Neurologic:  Alert and  oriented x4;  grossly normal neurologically. Skin:  Intact without significant lesions or rashes. Psych:  Alert and cooperative. Normal mood and affect. Abdomen:  Surgical scar noted ,Soft, nontender and nondistended. No masses, hepatosplenomegaly or hernias noted. Normal bowel sounds, without guarding, and without rebound.         Lab Results: Recent Labs    01/07/17 1838  WBC 14.4*  HGB 7.1*  HCT 21.7*  PLT 287   BMET Recent Labs    01/07/17 1838  NA 136  K 3.3*  CL 104  CO2 23  GLUCOSE 103*  BUN 25*  CREATININE 0.62  CALCIUM 8.5*   LFT Recent Labs    01/07/17 2225  PROT 6.1*  ALBUMIN 3.2*  AST 28  ALT 21  ALKPHOS 51  BILITOT 0.3  BILIDIR 0.1  IBILI 0.2*   PT/INR Recent Labs    01/07/17 2225  LABPROT 13.0  INR 0.99    Studies/Results: Dg Chest 2 View  Result Date: 01/07/2017 CLINICAL DATA:  Shortness of breath EXAM: CHEST  2 VIEW COMPARISON:  04/28/2013 FINDINGS: Post sternotomy changes. Surgical clips at the GE junction. No acute consolidation or effusion. Cardiomediastinal silhouette within normal limits. No pneumothorax. IMPRESSION: No active cardiopulmonary disease. Electronically Signed   By: Jasmine PangKim  Fujinaga M.D.   On: 01/07/2017 20:21    Impression: 1. Melena 2. Symptomatic anemia(baseline hemoglobin 14.2, 7.1 on admission) 3. Roux-en-Y gastric bypass surgery  Plan: Plan EGD today afternoon. ? Anastomotic ulcer, AVM, ischemic ulcer. If endoscopy is unremarkable, consider possibility of a small bowel bleed, due to gastric bypass placement of video capsule endoscopy may not  be possible. If endoscopy is negative recommend a bleeding scan, may need enteroscopy at a tertiary center if endoscopy is  unremarkable. 2 units PRBC transfused.  Currently on IV Protonix at 8 mg per hour. Hemodynamically stable.   LOS: 1 day   Kerin Salen, M.D.  01/08/2017, 8:35 AM  Pager [phone] If no answer or after 5 PM call [phone]

## 2017-01-08 NOTE — Interval H&P Note (Signed)
History and Physical Interval Note: 61/female with gastric bypass with anemia and melena for an EGD. She was on ASA 81 mg till 01/06/17.  01/08/2017 2:45 PM  Sierra Lucas  has presented today for EGD, with the diagnosis of Melena, anemia, history of gastric bypass  The various methods of treatment have been discussed with the patient and family. After consideration of risks, benefits and other options for treatment, the patient has consented to  Procedure(s): ESOPHAGOGASTRODUODENOSCOPY (EGD) WITH PROPOFOL (Left) as a surgical intervention .  The patient's history has been reviewed, patient examined, no change in status, stable for surgery.  I have reviewed the patient's chart and labs.  Questions were answered to the patient's satisfaction.     Kerin SalenArya Adetokunbo Mccadden

## 2017-01-08 NOTE — ED Notes (Signed)
Delay in morning blood labs d/t blood product administration.

## 2017-01-08 NOTE — ED Notes (Signed)
Notified patient sent message to pharmacy to send medication she requested.

## 2017-01-08 NOTE — Op Note (Signed)
[facility] Patient Name: Sierra Lucas Procedure Date : 01/08/2017 MRN: [id number] Attending MD: Kerin SalenArya Maili Shutters , MD Date of Birth: May 25, 1955 CSN: [id number] Age: 61 Admit Type: Inpatient Procedure:                Upper GI endoscopy Indications:              Acute post hemorrhagic anemia, Melena Providers:                Kerin SalenArya Arsh Feutz, MD, Tillie Fantasiaonna Pickering, RN, Beryle BeamsJanie Billups,                            Technician, Ferol LuzMichael McMillen, CRNA Referring MD:              Medicines:                Monitored Anesthesia Care Complications:            No immediate complications. Estimated Blood Loss:     Estimated blood loss: none. Procedure:                Pre-Anesthesia Assessment:                           - Prior to the procedure, a History and Physical                            was performed, and patient medications and                            allergies were reviewed. The patient's tolerance of                            previous anesthesia was also reviewed. The risks                            and benefits of the procedure and the sedation                            options and risks were discussed with the patient.                            All questions were answered, and informed consent                            was obtained. Prior Anticoagulants: The patient has                            taken aspirin, last dose was 2 days prior to                            procedure. ASA Grade Assessment: III - A patient                            with severe systemic disease. After reviewing the  risks and benefits, the patient was deemed in                            satisfactory condition to undergo the procedure.                           After obtaining informed consent, the endoscope was                            passed under direct vision. Throughout the                            procedure, the patient's blood pressure, pulse, and             oxygen saturations were monitored continuously. The                            EG-[id number] ([id number]) scope was introduced through the                            mouth, and advanced to the jejunum. The upper GI                            endoscopy was accomplished without difficulty. The                            patient tolerated the procedure well. Scope In: Scope Out: Findings:      The examined esophagus was normal.      The Z-line was regular and was found 35 cm from the incisors.      Evidence of a gastric bypass was found. A gastric pouch with a 10 cm       length from the GE junction to the gastrojejunal anastomosis was found.       The gastrojejunal anastomosis was characterized by ulceration. This was       traversed.      One non-bleeding cratered gastric ulcer with a clean ulcer base (Forrest       Class III) was found at the anastomosis. The lesion was 20 mm in largest       dimension.      The cardia and gastric fundus were normal on retroflexion.      The examined jejunum was normal. Impression:               - Normal esophagus.                           - Z-line regular, 35 cm from the incisors.                           - Gastric bypass with a pouch 10 cm in length.                            Gastrojejunal anastomosis characterized by                            ulceration.                           -  Non-bleeding gastric ulcer with a clean ulcer                            base (Forrest Class III).                           - Normal examined jejunum.                           - No specimens collected. Moderate Sedation:      Patient did not receive moderate sedation for this procedure, but       instead received monitored anesthesia care. Recommendation:           - Full liquid diet today.                           - Resume regular diet tomorrow if HB remains stable                            with no further evidence of bleeding.                           -  Continue present medications.                           - Ok to transition to PPI BID in am and to be                            continued for at least 4 weeks, then indefinitely                            as long as patient is on ASA.                           OK to resume ASA in 1 week.                           Will get H pylori serology and treat if found.                           - Return patient to hospital ward for ongoing care. Procedure Code(s):        --- Professional ---                           [phone], Esophagogastroduodenoscopy, flexible,                            transoral; diagnostic, including collection of                            specimen(s) by brushing or washing, when performed                            (separate procedure) Diagnosis Code(s):        --- Professional ---  K28.9, Gastrojejunal ulcer, unspecified as acute or                            chronic, without hemorrhage or perforation                           K25.9, Gastric ulcer, unspecified as acute or                            chronic, without hemorrhage or perforation                           D62, Acute posthemorrhagic anemia                           K92.1, Melena (includes Hematochezia) CPT copyright 2016 American Medical Association. All rights reserved. The codes documented in this report are preliminary and upon coder review may  be revised to meet current compliance requirements. Kerin SalenArya Dalynn Jhaveri, MD 01/08/2017 3:09:01 PM This report has been signed electronically. Number of Addenda: 0

## 2017-01-08 NOTE — ED Notes (Signed)
Family at bedside. 

## 2017-01-08 NOTE — Progress Notes (Signed)
PROGRESS NOTE  Sierra Lucas ZOX:[id number] DOB: 06-Jun-1955 DOA: 01/07/2017 PCP: System, Provider Not In  HPI/Recap of past 24 hours:  Patient is seen prior to egd, late note entry  Denies pain, no sob, no active bleed, family at bedside  Assessment/Plan: Principal Problem:   Gastrointestinal hemorrhage with melena Active Problems:   Acute blood loss anemia  Acute blood loss anemia Last colonoscopy was performed in 2011 by Dr. Evette CristalGanem from screening and was reported as unremarkable(repeat recommended in 10 years). 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 Body mass index is 35.07 kg/m.  HLD; continue statin  Code Status: full  Family Communication: patient  And family  Disposition Plan: home in 1-2 days , need gi clearance  She is from out of town, she is here visiting family.   Consultants:  GI  Procedures:  EGD on 11/29  Antibiotics:  none   Objective: BP 115/60 (BP Location: Left Arm)   Pulse 87   Temp 98.2 F (36.8 C) (Oral)   Resp 16   Ht 5\' 3"  (1.6 m)   Wt 89.8 kg (198 lb)   SpO2 100%   BMI 35.07 kg/m   Intake/Output Summary (Last 24 hours) at 01/08/2017 1912 Last data filed at 01/08/2017 1843 Gross per 24 hour  Intake 1616.41 ml  Output 0 ml  Net 1616.41 ml   Filed Weights   01/07/17 1846  Weight: 89.8 kg (198 lb)    Exam: Patient is examined daily including today on 01/08/2017, exams remain the same as of yesterday except that has changed    General:  Pale, NAD  Cardiovascular: RRR  Respiratory: CTABL  Abdomen: Soft/ND/NT, positive BS  Musculoskeletal: No Edema  Neuro: alert, oriented   Data Reviewed: Basic Metabolic Panel: Recent Labs  Lab 01/07/17 1838  NA 136  K 3.3*  CL 104  CO2 23  GLUCOSE 103*  BUN  25*  CREATININE 0.62  CALCIUM 8.5*   Liver Function Tests: Recent Labs  Lab 01/07/17 2225  AST 28  ALT 21  ALKPHOS 51  BILITOT 0.3  PROT 6.1*  ALBUMIN 3.2*   No results for input(s): LIPASE, AMYLASE in the last 168 hours. No results for input(s): AMMONIA in the last 168 hours. CBC: Recent Labs  Lab 01/07/17 1838 01/08/17 0745 01/08/17 1803  WBC 14.4* 8.8 11.9*  HGB 7.1* 8.0* 8.6*  HCT 21.7* 23.9* 26.2*  MCV 89.7 88.5 89.4  PLT 287 191 217   Cardiac Enzymes:   Recent Labs  Lab 01/07/17 1838  TROPONINI <0.03   BNP (last 3 results) No results for input(s): BNP in the last 8760 hours.  ProBNP (last 3 results) No results for input(s): PROBNP in the last 8760 hours.  CBG: No results for input(s): GLUCAP in the last 168 hours.  No results found for this or any previous visit (from the past 240 hour(s)).   Studies: Dg Chest 2 View  Result Date: 01/07/2017 CLINICAL DATA:  Shortness of breath EXAM: CHEST  2 VIEW COMPARISON:  04/28/2013 FINDINGS: Post sternotomy changes. Surgical clips at the GE junction. No acute consolidation or effusion. Cardiomediastinal silhouette within normal limits. No pneumothorax. IMPRESSION: No active cardiopulmonary disease. Electronically Signed   By: Jasmine PangKim  Fujinaga M.D.   On: 01/07/2017 20:21  Scheduled Meds: . [START ON 01/09/2017] pantoprazole  40 mg Intravenous Q12H  . rosuvastatin  10 mg Oral Daily  . venlafaxine XR  150 mg Oral Daily    Continuous Infusions: . sodium chloride    . pantoprozole (PROTONIX) infusion       Time spent: 25 mins, case discussed with GI Dr Marca AnconaKarki I have personally reviewed and interpreted on  01/08/2017 daily labs, tele strips, imagings as discussed above under date review session and assessment and plans.  I reviewed all nursing notes, pharmacy notes, consultant notes,  vitals, pertinent old records  I have discussed plan of care as described above with RN , patient and family on  01/08/2017   Albertine GratesFang Ketra Duchesne MD, PhD  [facility] Pager [phone]. If 7PM-7AM, please contact night-coverage at [email or web address], password [email or web address] 01/08/2017, 7:12 PM  LOS: 1 day

## 2017-01-08 NOTE — Anesthesia Preprocedure Evaluation (Signed)
Anesthesia Evaluation  Patient identified by MRN, date of birth, ID band Patient awake    Reviewed: Allergy & Precautions, H&P , NPO status , Patient's Chart, lab work & pertinent test results  Airway Mallampati: II   Neck ROM: full    Dental   Pulmonary neg pulmonary ROS,    breath sounds clear to auscultation       Cardiovascular hypertension,  Rhythm:regular Rate:Normal     Neuro/Psych  Headaches,    GI/Hepatic   Endo/Other    Renal/GU      Musculoskeletal   Abdominal   Peds  Hematology  (+) Blood dyscrasia, anemia ,   Anesthesia Other Findings   Reproductive/Obstetrics                             Anesthesia Physical Anesthesia Plan  ASA: III  Anesthesia Plan: MAC   Post-op Pain Management:    Induction: Intravenous  PONV Risk Score and Plan: 2 and Propofol infusion, Ondansetron and Treatment may vary due to age or medical condition  Airway Management Planned: Nasal Cannula  Additional Equipment:   Intra-op Plan:   Post-operative Plan:   Informed Consent: I have reviewed the patients History and Physical, chart, labs and discussed the procedure including the risks, benefits and alternatives for the proposed anesthesia with the patient or authorized representative who has indicated his/her understanding and acceptance.     Plan Discussed with: CRNA, Anesthesiologist and Surgeon  Anesthesia Plan Comments:         Anesthesia Quick Evaluation

## 2017-01-08 NOTE — Op Note (Signed)
EGD was performed for melena and acute posthemorrhagic anemia. Esophagus appeared unremarkable, as Z line was regular at 35 cm. 10 cm gastric cavity was noted. Retroflexion was unremarkable. A 2 cm cratered ulcer with clean base was noted at the anastomosis. The scope was advanced beyond the anastomosis in the jejunum appeared unremarkable. The rest of the gastric pouch appeared unremarkable.  Recommendation: Full liquid diet today. Regular diet in a.m. if no further evidence of bleeding. Okay to transition to PPI twice a day in a.m.. Will need PPI twice a day for at least 4 weeks, then once a day as long as patient is on aspirin. Okay to resume aspirin 81 mg in 1 week. Avoid other NSAIDs. Will send for H. pylori serology, treat if found.   Kerin SalenArya Bridgitt Raggio, M.D.

## 2017-01-08 NOTE — ED Notes (Signed)
MD at bedside. 

## 2017-01-08 NOTE — ED Notes (Signed)
Endo called stated will send for patient approximately at 1315.

## 2017-01-08 NOTE — ED Notes (Signed)
Pt ambulatory to restroom with c/o some dizziness.

## 2017-01-09 ENCOUNTER — Encounter (HOSPITAL_COMMUNITY): Payer: Self-pay | Admitting: Gastroenterology

## 2017-01-09 DIAGNOSIS — K921 Melena: Principal | ICD-10-CM

## 2017-01-09 LAB — BASIC METABOLIC PANEL
ANION GAP: 5 (ref 5–15)
BUN: 15 mg/dL (ref 6–20)
CHLORIDE: 108 mmol/L (ref 101–111)
CO2: 25 mmol/L (ref 22–32)
Calcium: 8.5 mg/dL — ABNORMAL LOW (ref 8.9–10.3)
Creatinine, Ser: 0.63 mg/dL (ref 0.44–1.00)
Glucose, Bld: 113 mg/dL — ABNORMAL HIGH (ref 65–99)
POTASSIUM: 3.8 mmol/L (ref 3.5–5.1)
SODIUM: 138 mmol/L (ref 135–145)

## 2017-01-09 LAB — BPAM RBC
Blood Product Expiration Date: 201812072359
Blood Product Expiration Date: 201812142359
ISSUE DATE / TIME: 201811290235
ISSUE DATE / TIME: 201811290453
Unit Type and Rh: 9500
Unit Type and Rh: 9500

## 2017-01-09 LAB — CBC
HCT: 24.6 % — ABNORMAL LOW (ref 36.0–46.0)
Hemoglobin: 8.3 g/dL — ABNORMAL LOW (ref 12.0–15.0)
MCH: 29.7 pg (ref 26.0–34.0)
MCHC: 33.7 g/dL (ref 30.0–36.0)
MCV: 88.2 fL (ref 78.0–100.0)
PLATELETS: 229 10*3/uL (ref 150–400)
RBC: 2.79 MIL/uL — ABNORMAL LOW (ref 3.87–5.11)
RDW: 15 % (ref 11.5–15.5)
WBC: 10.7 10*3/uL — ABNORMAL HIGH (ref 4.0–10.5)

## 2017-01-09 LAB — TYPE AND SCREEN
ABO/RH(D): O NEG
ANTIBODY SCREEN: NEGATIVE
UNIT DIVISION: 0
Unit division: 0

## 2017-01-09 LAB — MAGNESIUM: MAGNESIUM: 2.3 mg/dL (ref 1.7–2.4)

## 2017-01-09 MED ORDER — FERROUS SULFATE 325 (65 FE) MG PO TABS: ORAL_TABLET | ORAL | 3 refills | Status: DC

## 2017-01-09 MED ORDER — MULTI-VITAMIN/MINERALS PO TABS: 1.0000 | ORAL_TABLET | Freq: Every day | ORAL | 3 refills | Status: AC

## 2017-01-09 MED ORDER — ONDANSETRON 4 MG PO TBDP: 4.0000 mg | ORAL_TABLET | Freq: Three times a day (TID) | ORAL | 0 refills | Status: DC | PRN

## 2017-01-09 MED ORDER — PANTOPRAZOLE SODIUM 40 MG PO TBEC: 40.0000 mg | DELAYED_RELEASE_TABLET | Freq: Two times a day (BID) | ORAL | 3 refills | Status: DC

## 2017-01-09 NOTE — Anesthesia Postprocedure Evaluation (Signed)
Anesthesia Post Note  Patient: Sierra Lucas  Procedure(s) Performed: ESOPHAGOGASTRODUODENOSCOPY (EGD) WITH PROPOFOL (Left )     Patient location during evaluation: PACU Anesthesia Type: MAC Level of consciousness: awake and alert Pain management: pain level controlled Vital Signs Assessment: post-procedure vital signs reviewed and stable Respiratory status: spontaneous breathing, nonlabored ventilation, respiratory function stable and patient connected to nasal cannula oxygen Cardiovascular status: stable and blood pressure returned to baseline Postop Assessment: no apparent nausea or vomiting Anesthetic complications: no    Last Vitals:  Vitals:   01/08/17 2054 01/09/17 0458  BP: (!) 116/52 103/67  Pulse: 90 93  Resp: 17 18  Temp: 36.9 C 36.6 C  SpO2: 97% 98%    Last Pain:  Vitals:   01/09/17 0458  TempSrc: Oral  PainSc:    Pain Goal:                 [name] S

## 2017-01-09 NOTE — Progress Notes (Signed)
Subjective: No abdominal pain. No blood in stool.  Objective: Vital signs in last 24 hours: Temp:  [97.9 F (36.6 C)-98.4 F (36.9 C)] 98 F (36.7 C) (11/30 0906) Pulse Rate:  [81-94] 81 (11/30 0906) Resp:  [11-21] 18 (11/30 0906) BP: (102-116)/(49-67) 107/66 (11/30 0906) SpO2:  [96 %-100 %] 100 % (11/30 0906) Weight:  [207 lb 3.7 oz (94 kg)] 207 lb 3.7 oz (94 kg) (11/30 0458) Weight change: 9 lb 3.7 oz (4.188 kg) Last BM Date: 01/07/17  PE: GEN:  NAD  Lab Results: CBC    Component Value Date/Time   WBC 10.7 (H) 01/09/2017 0000   RBC 2.79 (L) 01/09/2017 0000   HGB 8.3 (L) 01/09/2017 0000   HCT 24.6 (L) 01/09/2017 0000   PLT 229 01/09/2017 0000   MCV 88.2 01/09/2017 0000   MCH 29.7 01/09/2017 0000   MCHC 33.7 01/09/2017 0000   RDW 15.0 01/09/2017 0000   LYMPHSABS 1.1 04/28/2013 0216   MONOABS 0.6 04/28/2013 0216   EOSABS 0.2 04/28/2013 0216   BASOSABS 0.0 04/28/2013 0216   CMP     Component Value Date/Time   NA 138 01/09/2017 0508   K 3.8 01/09/2017 0508   CL 108 01/09/2017 0508   CO2 25 01/09/2017 0508   GLUCOSE 113 (H) 01/09/2017 0508   BUN 15 01/09/2017 0508   CREATININE 0.63 01/09/2017 0508   CALCIUM 8.5 (L) 01/09/2017 0508   PROT 6.1 (L) 01/07/2017 2225   ALBUMIN 3.2 (L) 01/07/2017 2225   AST 28 01/07/2017 2225   ALT 21 01/07/2017 2225   ALKPHOS 51 01/07/2017 2225   BILITOT 0.3 01/07/2017 2225   GFRNONAA >60 01/09/2017 0508   GFRAA >60 01/09/2017 0508   Assessment:  1.  Melena, resolved. 2.  Anemia, acute blood loss, stable. 3.  Gastric bypass with clean-based low-risk anastomotic ulcer at gastrojejunostomy.  Plan:  1.  Pantoprazole 40 mg po bid x 6 weeks, then 40 mg po qd indefinitely. 2.  Check H. Pylori and treat if positive; I will order (can't see that it was ordered). 3.  [facility]; she will need to follow-up with her PCP in [place] upon discharge.   Freddy JakschOUTLAW,Jarquez Mestre M 01/09/2017, 10:39 AM   Cell  [phone] If no answer or after 5 PM call [phone]

## 2017-01-09 NOTE — Discharge Summary (Signed)
Sierra Lucas, is a 61 y.o. female  DOB November 07, 1955  MRN [id number].  Admission date:  01/07/2017  Admitting Physician  Hillary BowJared M Gardner, DO  Discharge Date:  01/09/2017   Primary MD  System, Provider Not In  Recommendations for primary care physician for things to follow:   Repeat CBC within 1 week  Admission Diagnosis  Gastrointestinal hemorrhage with melena [K92.1] Gastrointestinal hemorrhage, unspecified gastrointestinal hemorrhage type [K92.2] Anemia, unspecified type [D64.9]   Discharge Diagnosis  Gastrointestinal hemorrhage with melena [K92.1] Gastrointestinal hemorrhage, unspecified gastrointestinal hemorrhage type [K92.2] Anemia, unspecified type [D64.9]    Principal Problem:   Gastrointestinal hemorrhage with melena Active Problems:   Acute blood loss anemia      Past Medical History:  Diagnosis Date  . Abnormal Pap smear 06/19/2005   ASCUS   . Coronary artery disease   . GI (gastrointestinal bleed) 01/08/2017  . Headache(784.0)   . HGSIL (high grade squamous intraepithelial dysplasia) 2005  . Systemic hypertension 10/11/2013   PATIENT DENIES    Past Surgical History:  Procedure Laterality Date  . BLADDER SURGERY  2004   Ask patient to clarify. Not on history form.  Marland Kitchen. CARDIAC SURGERY  2000   Bypass surgery.  . CYSTECTOMY  06/11/10   REMOVED ON BUTTOCKS   . GALLBLADDER SURGERY  1981   Removal? Ask patient to clarify.  Marland Kitchen. GASTRIC BYPASS  06/24/2012  . INGUINAL HIDRADENITIS EXCISION  2013  . NOVASURE ABLATION  07/18/2005  . TUBAL LIGATION         HPI  from the history and physical done on the day of admission:     HPI: Sierra Lucas is a 61 y.o. 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  Patient lives in [place] now.  In town visiting family in [place].  Since Thanksgiving she has had black stools.  Also has LUQ abd pain  for a while now.  No prior h/o GI bleeds.  SOB with any exertion for past 2-3 days.  Associated tachycardia, no chest pain.  Today developed vertigo and room spinning sensations.  Presents to ED.   ED Course: HGB 7.1, 2 unit PRBC transfusion ordered.  BUN 25, creat 0.62.    Hospital Course:  EGD on 01/08/17 EGD was performed for melena and acute posthemorrhagic anemia. Esophagus appeared unremarkable, as Z line was regular at 35 cm. 10 cm gastric cavity was noted. Retroflexion was unremarkable. A 2 cm cratered ulcer with clean base was noted at the anastomosis. The scope was advanced beyond the anastomosis in the jejunum appeared unremarkable. The rest of the gastric pouch appeared unremarkable  1)Acute blood loss anemia- s/p post EGD for melena and acute posthemorrhagic anemia by Dr Marca AnconaKarki on 01/08/2017 as noted above showing  2 cm cratered ulcer with clean base was noted at the anastomosis. Last colonoscopy was performed in 2011 by Dr.Ganemfrom screening and was reported as unremarkable(repeat recommended in 10 years).  Hemoglobin is stable above 8 after transfusion of 2  units of packed cells.  Repeat CBC within a week advised avoidance of NSAIDs advised , Protonix as prescribed  2)Obesity- She had a Roux-en-Y gastric bypass [place] 4 [place] ago Patient lost 50 pounds after her bypass surgery,but gained 18 pounds back within a year. Body mass index is 35.07 kg/m.  3)HLD; continue statin   Discharge Condition: stable,   Follow UP... PCP in [place] within a week     Consults obtained - Gi  Diet and Activity recommendation:  As advised  Discharge Instructions    Discharge Instructions    Call MD for:  difficulty breathing, headache or visual disturbances   Complete by:  As directed    Call MD for:  persistant dizziness or light-headedness   Complete by:  As directed    Call MD for:  persistant nausea and vomiting   Complete by:  As directed     Call MD for:  severe uncontrolled pain   Complete by:  As directed    Call MD for:  temperature >100.4   Complete by:  As directed    Diet - low sodium heart healthy   Complete by:  As directed    Discharge instructions   Complete by:  As directed    1)Repeat complete blood count (CBC Test)  within a week 2)Take Protonix 40 mg twice a day until the new year and then decreased to 40 mg daily as long as you are taking baby aspirin 3) call GI office in 1-2 weeks to get results of your biopsy/H. pylori test 4) take multivitamin with iron daily 5)Avoid ibuprofen/Advil/Aleve/Motrin/Goody Powders/Naproxen/BC powders as these will make you more likely to bleed and can cause stomach ulcers 6) call or return if shortness of breath, abdominal pain, vomiting blood or dark stools (please note that iron tablets can make your stools black/dark)   Increase activity slowly   Complete by:  As directed         Discharge Medications     Allergies as of 01/09/2017      Reactions   Iohexol     Code: RASH, Desc: PT NEEDS 13 HOUR PREP pt had prior contrast yrs ago W/ itching.   50 mg benadryl po 15 mins before scan.  she still had itching of mouth, lips, throat and breast. needs po benadryl 1 hr prior per dr. Denny Levyschick, Onset Date: [id number]   Latex    Ask patient to clarify type/severity/reaction. Not indicated on history form dated 06/05/10.   Sulfa Antibiotics    Ask patient to clarify type/severity/reaction. Not indicated on medical history form dated 06/05/10.      Medication List    TAKE these medications   ALPRAZolam 0.5 MG tablet Commonly known as:  XANAX Take 0.5 mg by mouth as needed for anxiety.   aspirin 81 MG tablet Take 81 mg by mouth daily.   Biotin 1 MG Caps Take 1 mg by mouth daily.   ferrous sulfate 325 (65 FE) MG tablet 1 twice a day   hydrochlorothiazide 12.5 MG capsule Commonly known as:  MICROZIDE Take 1 capsule (12.5 mg total) by mouth daily. *Please keep 01/23/16  appointment*   loratadine 10 MG tablet Commonly known as:  CLARITIN Take 10 mg by mouth daily. Ask patient to clarify dosage. Not indicated on history form dated 06/05/10.   multivitamin with minerals tablet Take 1 tablet by mouth daily.   ondansetron 4 MG disintegrating tablet Commonly known as:  ZOFRAN ODT Take 1  tablet (4 mg total) by mouth every 8 (eight) hours as needed for nausea or vomiting.   pantoprazole 40 MG tablet Commonly known as:  PROTONIX Take 1 tablet (40 mg total) by mouth 2 (two) times daily before a meal.   rosuvastatin 10 MG tablet Commonly known as:  CRESTOR TAKE 1 TABLET BY MOUTH EVERY DAY   venlafaxine XR 150 MG 24 hr capsule Commonly known as:  EFFEXOR-XR Take 150 mg by mouth daily.       Major procedures and Radiology Reports - PLEASE review detailed and final reports for all details, in brief -   Dg Chest 2 View  Result Date: 01/07/2017 CLINICAL DATA:  Shortness of breath EXAM: CHEST  2 VIEW COMPARISON:  04/28/2013 FINDINGS: Post sternotomy changes. Surgical clips at the GE junction. No acute consolidation or effusion. Cardiomediastinal silhouette within normal limits. No pneumothorax. IMPRESSION: No active cardiopulmonary disease. Electronically Signed   By: Jasmine Pang M.D.   On: 01/07/2017 20:21    Micro Results   No results found for this or any previous visit (from the past 240 hour(s)).     Today   Subjective    Sierra Lucas today has no new complaints, no emesis, no BM, husband at bedside, patient tolerating oral intake well, requesting discharge home.  no dizziness no palpitations no shortness of breath no dyspnea on exertion no chest pains          Patient has been seen and examined prior to discharge   Objective   Blood pressure 107/66, pulse 81, temperature 98 F (36.7 C), temperature source Oral, resp. rate 18, height 5\' 3"  (1.6 m), weight 94 kg (207 lb 3.7 oz), SpO2 100 %.   Intake/Output Summary (Last 24 hours) at  01/09/2017 0941 Last data filed at 01/09/2017 0900 Gross per 24 hour  Intake 1436.58 ml  Output 900 ml  Net 536.58 ml    Exam Gen:- Awake  In no apparent distress  HEENT:- [place].AT,   Neck-Supple Neck,No JVD,  Lungs- mostly clear  CV- S1, S2 normal Abd-  +ve B.Sounds, Abd Soft, No tenderness,    Extremity/Skin:- Intact peripheral pulses   Psych-appropriate affect   Data Review   CBC w Diff:  Lab Results  Component Value Date   WBC 10.7 (H) 01/09/2017   HGB 8.3 (L) 01/09/2017   HCT 24.6 (L) 01/09/2017   PLT 229 01/09/2017   LYMPHOPCT 8 (L) 04/28/2013   MONOPCT 4 04/28/2013   EOSPCT 1 04/28/2013   BASOPCT 0 04/28/2013    CMP:  Lab Results  Component Value Date   NA 138 01/09/2017   K 3.8 01/09/2017   CL 108 01/09/2017   CO2 25 01/09/2017   BUN 15 01/09/2017   CREATININE 0.63 01/09/2017   PROT 6.1 (L) 01/07/2017   ALBUMIN 3.2 (L) 01/07/2017   BILITOT 0.3 01/07/2017   ALKPHOS 51 01/07/2017   AST 28 01/07/2017   ALT 21 01/07/2017  .   Total Discharge time is about 33 minutes  Shon Hale M.D on 01/09/2017 at 9:41 AM  [facility]   Office  [phone]  Voice Recognition Reubin [place] dictation system was used to create this note, attempts have been made to correct errors. Please contact the author with questions and/or clarifications.

## 2017-08-10 DIAGNOSIS — M25569 Pain in unspecified knee: Secondary | ICD-10-CM | POA: Insufficient documentation

## 2017-12-23 ENCOUNTER — Other Ambulatory Visit: Payer: Self-pay | Admitting: Orthopedic Surgery

## 2017-12-23 DIAGNOSIS — M25551 Pain in right hip: Secondary | ICD-10-CM

## 2017-12-27 ENCOUNTER — Other Ambulatory Visit: Payer: 59

## 2018-01-02 ENCOUNTER — Other Ambulatory Visit: Payer: 59

## 2020-04-12 ENCOUNTER — Telehealth: Payer: 59 | Admitting: Physician Assistant

## 2020-04-12 DIAGNOSIS — R300 Dysuria: Secondary | ICD-10-CM

## 2020-04-12 MED ORDER — CEPHALEXIN 500 MG PO CAPS: 500.0000 mg | ORAL_CAPSULE | Freq: Two times a day (BID) | ORAL | 0 refills | Status: AC

## 2020-04-12 NOTE — Progress Notes (Signed)

## 2020-04-12 NOTE — Progress Notes (Signed)
I have spent 5 minutes in review of e-visit questionnaire, review and updating patient chart, medical decision making and response to patient.   Jonathan Corpus Cody Cruzita Lipa, PA-C    

## 2020-04-23 ENCOUNTER — Telehealth: Payer: Self-pay | Admitting: Physician Assistant

## 2020-04-23 DIAGNOSIS — N390 Urinary tract infection, site not specified: Secondary | ICD-10-CM

## 2020-04-23 NOTE — Progress Notes (Signed)
Based on what you shared with me, I feel your condition warrants further evaluation and I recommend that you be seen for a face to face office visit. Because you are still having symptoms after treatment, you need to be seen in person so a sample of urine can be obtained, tested and sent for culture to make sure you are getting the appropriate antibiotic for the cause of your UTI and to rule out other conditions that can mimic the symptoms of a bladder infection. Please give your PCP a call to see if they can see you. You can also look into one of the urgent care centers below or another UC near you if they are unable to see you.    NOTE: If you entered your credit card information for this eVisit, you will not be charged. You may see a "hold" on your card for the $35 but that hold will drop off and you will not have a charge processed.   If you are having a true medical emergency please call 911.      For an urgent face to face visit, [place] has five urgent care centers for your convenience:     [facility] at [facility] Directions [phone] [address] [place], [place] [address] . 10 am - 6pm Monday - Friday    [facility]) Get Driving Directions [phone] [address] [place], [place] [address] . 10 am to 8 pm Monday-Friday . 12 pm to 8 pm [facility] at [facility] Get Driving Directions [phone] [address], [address] [place], [place] [address] . 8 am to 8 pm Monday-Friday . 9 am to 6 pm Saturday . 11 am to 6 pm Sunday     [facility] at [facility] Get Driving Directions  [phone] [address] [place], [place] [address] . 8 am to 8 pm Monday-Friday . 8 am to 4 pm [facility] Urgent Care at [facility] Directions [phone] [address] [place], [place]  [address] . 12 pm to 6 pm Monday-Friday      Your e-visit answers were reviewed by a board certified advanced clinical practitioner to complete your personal care plan.  Thank you for using e-Visits.

## 2020-07-01 ENCOUNTER — Telehealth: Payer: Self-pay | Admitting: Family

## 2020-07-01 DIAGNOSIS — R399 Unspecified symptoms and signs involving the genitourinary system: Secondary | ICD-10-CM

## 2020-07-01 MED ORDER — CEPHALEXIN 500 MG PO CAPS: 500.0000 mg | ORAL_CAPSULE | Freq: Two times a day (BID) | ORAL | 0 refills | Status: DC

## 2020-07-01 NOTE — Progress Notes (Signed)

## 2020-09-29 ENCOUNTER — Telehealth: Payer: Self-pay | Admitting: Nurse Practitioner

## 2020-09-29 DIAGNOSIS — N3000 Acute cystitis without hematuria: Secondary | ICD-10-CM

## 2020-09-29 MED ORDER — CEPHALEXIN 500 MG PO CAPS: 500.0000 mg | ORAL_CAPSULE | Freq: Two times a day (BID) | ORAL | 0 refills | Status: AC

## 2020-09-29 NOTE — Progress Notes (Signed)
E-Visit for Urinary Problems  We are sorry that you are not feeling well.  Here is how we plan to help!  Based on what you shared with me it looks like you most likely have a simple urinary tract infection.  A UTI (Urinary Tract Infection) is a bacterial infection of the bladder.  Most cases of urinary tract infections are simple to treat but a key part of your care is to encourage you to drink plenty of fluids and watch your symptoms carefully.  I have prescribed Keflex 500 mg twice a day for 7 days.  Your symptoms should gradually improve. Call us if the burning in your urine worsens, you develop worsening fever, back pain or pelvic pain or if your symptoms do not resolve after completing the antibiotic.  Urinary tract infections can be prevented by drinking plenty of water to keep your body hydrated.  Also be sure when you wipe, wipe from front to back and don't hold it in!  If possible, empty your bladder every 4 hours.  HOME CARE Drink plenty of fluids Compete the full course of the antibiotics even if the symptoms resolve Remember, when you need to go.go. Holding in your urine can increase the likelihood of getting a UTI! GET HELP RIGHT AWAY IF: You cannot urinate You get a high fever Worsening back pain occurs You see blood in your urine You feel sick to your stomach or throw up You feel like you are going to pass out  MAKE SURE YOU  Understand these instructions. Will watch your condition. Will get help right away if you are not doing well or get worse.   Thank you for choosing an e-visit.  Your e-visit answers were reviewed by a board certified advanced clinical practitioner to complete your personal care plan. Depending upon the condition, your plan could have included both over the counter or prescription medications.  Please review your pharmacy choice. Make sure the pharmacy is open so you can pick up prescription now. If there is a problem, you may contact your  provider through MyChart messaging and have the prescription routed to another pharmacy.  Your safety is important to us. If you have drug allergies check your prescription carefully.   For the next 24 hours you can use MyChart to ask questions about today's visit, request a non-urgent call back, or ask for a work or school excuse. You will get an email in the next two days asking about your experience. I hope that your e-visit has been valuable and will speed your recovery.   Meds ordered this encounter  Medications   cephALEXin (KEFLEX) 500 MG capsule    Sig: Take 1 capsule (500 mg total) by mouth 2 (two) times daily for 7 days.    Dispense:  14 capsule    Refill:  0    I spent approximately 7 minutes reviewing the patient's history, current symptoms and coordinating their plan of care today.   

## 2020-11-07 ENCOUNTER — Other Ambulatory Visit: Payer: Self-pay | Admitting: Family Medicine

## 2020-11-07 DIAGNOSIS — Z1231 Encounter for screening mammogram for malignant neoplasm of breast: Secondary | ICD-10-CM

## 2020-12-06 ENCOUNTER — Ambulatory Visit
Admission: RE | Admit: 2020-12-06 | Discharge: 2020-12-06 | Disposition: A | Discharge status: Home / Self Care | Payer: 59 | Source: Ambulatory Visit | Attending: Family Medicine | Admitting: Family Medicine

## 2020-12-06 ENCOUNTER — Other Ambulatory Visit: Payer: Self-pay

## 2020-12-06 DIAGNOSIS — Z1231 Encounter for screening mammogram for malignant neoplasm of breast: Secondary | ICD-10-CM

## 2021-01-09 ENCOUNTER — Other Ambulatory Visit: Payer: Self-pay

## 2021-01-09 ENCOUNTER — Ambulatory Visit
Admission: RE | Admit: 2021-01-09 | Discharge: 2021-01-09 | Disposition: A | Discharge status: Home / Self Care | Payer: 59 | Source: Ambulatory Visit | Attending: Family Medicine | Admitting: Family Medicine

## 2021-01-25 ENCOUNTER — Telehealth: Payer: 59 | Admitting: Family

## 2021-01-25 DIAGNOSIS — R399 Unspecified symptoms and signs involving the genitourinary system: Secondary | ICD-10-CM

## 2021-01-25 MED ORDER — CEPHALEXIN 500 MG PO CAPS: 500.0000 mg | ORAL_CAPSULE | Freq: Two times a day (BID) | ORAL | 0 refills | Status: DC

## 2021-01-25 NOTE — Progress Notes (Signed)

## 2021-01-28 ENCOUNTER — Encounter: Payer: Self-pay | Admitting: Interventional Cardiology

## 2021-01-28 ENCOUNTER — Other Ambulatory Visit: Payer: Self-pay

## 2021-01-28 ENCOUNTER — Ambulatory Visit (INDEPENDENT_AMBULATORY_CARE_PROVIDER_SITE_OTHER): Payer: 59 | Admitting: Interventional Cardiology

## 2021-01-28 VITALS — BP 150/98 | HR 91 | Ht 63.0 in | Wt 209.2 lb

## 2021-01-28 DIAGNOSIS — I10 Essential (primary) hypertension: Secondary | ICD-10-CM

## 2021-01-28 DIAGNOSIS — Z8249 Family history of ischemic heart disease and other diseases of the circulatory system: Secondary | ICD-10-CM

## 2021-01-28 DIAGNOSIS — E7849 Other hyperlipidemia: Secondary | ICD-10-CM | POA: Diagnosis not present

## 2021-01-28 DIAGNOSIS — I25700 Atherosclerosis of coronary artery bypass graft(s), unspecified, with unstable angina pectoris: Secondary | ICD-10-CM | POA: Diagnosis not present

## 2021-01-28 MED ORDER — PREDNISONE 50 MG PO TABS: ORAL_TABLET | ORAL | 0 refills | Status: DC

## 2021-01-28 NOTE — Progress Notes (Signed)
Cardiology Office Note:    Date:  01/28/2021   ID:  Sierra Lucas, DOB February 17, 1955, MRN [id number]  PCP:  Gweneth Dimitri, MD  Cardiologist:  Lesleigh Noe, MD   Referring MD: Gweneth Dimitri, MD   Chief Complaint  Patient presents with   Coronary Artery Disease    History of Present Illness:    Sierra Lucas is a 65 y.o. female with a hx of coronary artery bypass grafting by Dr. Lovett Sox 2020 with LIMA to LAD.  Other medical history and includes primary hypertension, hyperlipidemia, and family history of aneurysm.   The patient has previously lived in [place].  She was last seen here in 2016.  After returning from [place] she lived for a while in [place] and is now moved back to [place].  She has occasional bilateral shoulder discomfort.  It is an achy type feeling not precipitated by activity or circumstances.  Episodes are spontaneous.  Can last up to 20 minutes.  Has been going on intermittently for more than a year.  Can go several weeks without recurrence.  Denies angina, orthopnea, PND, palpitations, syncope, and peripheral edema.  Past Medical History:  Diagnosis Date   Abnormal Pap smear 06/19/2005   ASCUS    Coronary artery disease    GI (gastrointestinal bleed) 01/08/2017   Headache(784.0)    HGSIL (high grade squamous intraepithelial dysplasia) 2005   Systemic hypertension 10/11/2013   PATIENT DENIES    Past Surgical History:  Procedure Laterality Date   BLADDER SURGERY  2004   Ask patient to clarify. Not on history form.   CARDIAC SURGERY  2000   Bypass surgery.   CYSTECTOMY  06/11/10   REMOVED ON BUTTOCKS    ESOPHAGOGASTRODUODENOSCOPY (EGD) WITH PROPOFOL Left 01/08/2017   Procedure: ESOPHAGOGASTRODUODENOSCOPY (EGD) WITH PROPOFOL;  Surgeon: Kerin Salen, MD;  Location: [facility] ENDOSCOPY;  Service: Gastroenterology;  Laterality: Left;   GALLBLADDER SURGERY  1981   Removal? Ask patient to clarify.   GASTRIC BYPASS  06/24/2012   INGUINAL  HIDRADENITIS EXCISION  2013   NOVASURE ABLATION  07/18/2005   TUBAL LIGATION      Current Medications: Current Meds  Medication Sig   ALPRAZolam (XANAX) 0.5 MG tablet Take 0.5 mg by mouth as needed for anxiety.    aspirin 81 MG tablet Take 81 mg by mouth daily.    Biotin 1 MG CAPS Take 1 mg by mouth daily.    cephALEXin (KEFLEX) 500 MG capsule Take 1 capsule (500 mg total) by mouth 2 (two) times daily.   ferrous sulfate 325 (65 FE) MG tablet 1 twice a day   hydrochlorothiazide (MICROZIDE) 12.5 MG capsule Take 1 capsule (12.5 mg total) by mouth daily. *Please keep 01/23/16 appointment*   loratadine (CLARITIN) 10 MG tablet Take 10 mg by mouth daily. Ask patient to clarify dosage. Not indicated on history form dated 06/05/10.    predniSONE (DELTASONE) 50 MG tablet Take one tablet by mouth 13 hours, 7 hours and 1 hour prior to test   rosuvastatin (CRESTOR) 10 MG tablet TAKE 1 TABLET BY MOUTH EVERY DAY   venlafaxine XR (EFFEXOR-XR) 150 MG 24 hr capsule Take 150 mg by mouth daily.     Allergies:   Iohexol, Latex, and Sulfa antibiotics   Social History   Socioeconomic History   Marital status: Married    Spouse name: Not on file   Number of children: Not on file   Years of education: Not on file   Highest  education level: Not on file  Occupational History   Not on file  Tobacco Use   Smoking status: Never   Smokeless tobacco: Never  Vaping Use   Vaping Use: Never used  Substance and Sexual Activity   Alcohol use: Yes    Comment: two per month.   Drug use: No   Sexual activity: Yes    Birth control/protection: Surgical    Comment: BTL/ABLATION   Other Topics Concern   Not on file  Social History Narrative   Not on file   Social Determinants of Health   Financial Resource Strain: Not on file  Food Insecurity: Not on file  Transportation Needs: Not on file  Physical Activity: Not on file  Stress: Not on file  Social Connections: Not on file     Family History: The  patient's family history includes Heart disease in her father.  ROS:   Please see the history of present illness.    Anxiety, particularly today related to her brother-in-law being taken off life support in [place].  He recently had a cerebral aneurysm rupture.  She is very active.  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  She randomly has cramps in her hands and legs.  All other systems reviewed and are negative.  EKGs/Labs/Other Studies Reviewed:    The following studies were reviewed today: No recent or new cardiac imaging  EKG:  EKG normal sinus rhythm with, precordial T wave inversion, incomplete right bundle.  No old tracing to compare  Recent Labs: No results found for requested labs within last 8760 hours.  Recent Lipid Panel    Component Value Date/Time   CHOL 136 10/26/2014 0805   TRIG 164.0 (H) 10/26/2014 0805   HDL 59.30 10/26/2014 0805   CHOLHDL 2 10/26/2014 0805   VLDL 32.8 10/26/2014 0805   LDLCALC 43 10/26/2014 0805    Physical Exam:    VS:  BP (!) 150/98    Pulse 91    Ht 5\' 3"  (1.6 m)    Wt 209 lb 3.2 oz (94.9 kg)    SpO2 96%    BMI 37.06 kg/m     Wt Readings from Last 3 Encounters:  01/28/21 209 lb 3.2 oz (94.9 kg)  01/09/17 207 lb 3.7 oz (94 kg)  10/27/14 193 lb 1.9 oz (87.6 kg)     GEN: Overweight. No acute distress HEENT: Normal NECK: No JVD. LYMPHATICS: No lymphadenopathy CARDIAC: No murmur. RRR no gallop, or edema. VASCULAR:  Normal Pulses. No bruits. RESPIRATORY:  Clear to auscultation without rales, wheezing or rhonchi  ABDOMEN: Soft, non-tender, non-distended, No pulsatile mass, MUSCULOSKELETAL: No deformity  SKIN: Warm and dry NEUROLOGIC:  Alert and oriented x 3 PSYCHIATRIC:  Normal affect   ASSESSMENT:    1. Coronary artery disease involving coronary bypass graft of native heart with unstable angina pectoris ([place])   2. Other hyperlipidemia   3. Primary hypertension   4. Family history of aneurysm    PLAN:    In order of problems  listed above:  Continue secondary prevention. Continue statin therapy with LDL target less than 70. Blood pressure target 130/80.  Exercise and low-salt diet recommended.  Monitor blood pressure at least a couple times per month at home. She has been having bilateral aching discomfort near both shoulders.  No interscapular discomfort.  Given family history and also premature atherosclerosis, we will do an aortic contrast CTA to rule out aneurysm.    Overall education and awareness concerning secondary risk prevention was  discussed in detail: LDL less than 70, hemoglobin A1c less than 7, blood pressure target less than 130/80 mmHg, >150 minutes of moderate aerobic activity per week, avoidance of smoking, weight control (via diet and exercise), and continued surveillance/management of/for obstructive sleep apnea.    Medication Adjustments/Labs and Tests Ordered: Current medicines are reviewed at length with the patient today.  Concerns regarding medicines are outlined above.  Orders Placed This Encounter  Procedures   CT ANGIO CHEST AORTA W/CM & OR WO/CM   Basic metabolic panel   EKG [id number]   Meds ordered this encounter  Medications   predniSONE (DELTASONE) 50 MG tablet    Sig: Take one tablet by mouth 13 hours, 7 hours and 1 hour prior to test    Dispense:  3 tablet    Refill:  0    Patient Instructions  Medication Instructions:  Your physician recommends that you continue on your current medications as directed. Please refer to the Current Medication list given to you today.  *If you need a refill on your cardiac medications before your next appointment, please call your pharmacy*   Lab Work: BMET prior to CT  If you have labs (blood work) drawn today and your tests are completely normal, you will receive your results only by: [place] (if you have MyChart) OR A paper copy in the mail If you have any lab test that is abnormal or we need to change your treatment, we  will call you to review the results.   Testing/Procedures: Non-Cardiac CT Angiography (CTA), is a special type of CT scan that uses a computer to produce multi-dimensional views of major blood vessels throughout the body. In CT angiography, a contrast material is injected through an IV to help visualize the blood vessels   Follow-Up: At [facility], you and your health needs are our priority.  As part of our continuing mission to provide you with exceptional heart care, we have created designated Provider Care Teams.  These Care Teams include your primary Cardiologist (physician) and Advanced Practice Providers (APPs -  Physician Assistants and Nurse Practitioners) who all work together to provide you with the care you need, when you need it.  We recommend signing up for the patient portal called "MyChart".  Sign up information is provided on this After Visit Summary.  MyChart is used to connect with patients for Virtual Visits (Telemedicine).  Patients are able to view lab/test results, encounter notes, upcoming appointments, etc.  Non-urgent messages can be sent to your provider as well.   To learn more about what you can do with MyChart, go to [email or web address].    Your next appointment:   1 year(s)  The format for your next appointment:   In Person  Provider:   Sinclair Grooms, MD     Other Instructions     Signed, Sinclair Grooms, MD  01/28/2021 3:39 PM    [place]

## 2021-01-28 NOTE — Patient Instructions (Signed)
Medication Instructions:  Your physician recommends that you continue on your current medications as directed. Please refer to the Current Medication list given to you today.  *If you need a refill on your cardiac medications before your next appointment, please call your pharmacy*   Lab Work: BMET prior to CT  If you have labs (blood work) drawn today and your tests are completely normal, you will receive your results only by: MyChart Message (if you have MyChart) OR A paper copy in the mail If you have any lab test that is abnormal or we need to change your treatment, we will call you to review the results.   Testing/Procedures: Non-Cardiac CT Angiography (CTA), is a special type of CT scan that uses a computer to produce multi-dimensional views of major blood vessels throughout the body. In CT angiography, a contrast material is injected through an IV to help visualize the blood vessels   Follow-Up: At [facility], you and your health needs are our priority.  As part of our continuing mission to provide you with exceptional heart care, we have created designated Provider Care Teams.  These Care Teams include your primary Cardiologist (physician) and Advanced Practice Providers (APPs -  Physician Assistants and Nurse Practitioners) who all work together to provide you with the care you need, when you need it.  We recommend signing up for the patient portal called "MyChart".  Sign up information is provided on this After Visit Summary.  MyChart is used to connect with patients for Virtual Visits (Telemedicine).  Patients are able to view lab/test results, encounter notes, upcoming appointments, etc.  Non-urgent messages can be sent to your provider as well.   To learn more about what you can do with MyChart, go to [email or web address].    Your next appointment:   1 year(s)  The format for your next appointment:   In Person  Provider:   Lesleigh Noe, MD     Other  Instructions

## 2021-02-18 ENCOUNTER — Telehealth: Payer: Self-pay | Admitting: Interventional Cardiology

## 2021-02-18 NOTE — Telephone Encounter (Signed)
Called pt about dye allergy and the need to pre medicate.  Pt states she is aware and picked up her Prednisone already.  Reiterated instructions and pt verbalized understanding.  Pt appreciative for call.

## 2021-02-20 ENCOUNTER — Other Ambulatory Visit: Payer: 59 | Admitting: *Deleted

## 2021-02-20 ENCOUNTER — Other Ambulatory Visit: Payer: Self-pay

## 2021-02-20 DIAGNOSIS — I10 Essential (primary) hypertension: Secondary | ICD-10-CM

## 2021-02-20 DIAGNOSIS — I25700 Atherosclerosis of coronary artery bypass graft(s), unspecified, with unstable angina pectoris: Secondary | ICD-10-CM

## 2021-02-20 LAB — BASIC METABOLIC PANEL
BUN/Creatinine Ratio: 22 (ref 12–28)
BUN: 16 mg/dL (ref 8–27)
CO2: 27 mmol/L (ref 20–29)
Calcium: 9.9 mg/dL (ref 8.7–10.3)
Chloride: 97 mmol/L (ref 96–106)
Creatinine, Ser: 0.73 mg/dL (ref 0.57–1.00)
Glucose: 102 mg/dL — ABNORMAL HIGH (ref 70–99)
Potassium: 4.1 mmol/L (ref 3.5–5.2)
Sodium: 135 mmol/L (ref 134–144)
eGFR: 92 mL/min/{1.73_m2} (ref >59)

## 2021-02-26 NOTE — Progress Notes (Signed)
Pt has been made aware of normal result and verbalized understanding.  jw

## 2021-02-27 ENCOUNTER — Other Ambulatory Visit: Payer: Self-pay

## 2021-02-27 ENCOUNTER — Ambulatory Visit (INDEPENDENT_AMBULATORY_CARE_PROVIDER_SITE_OTHER)
Admission: RE | Admit: 2021-02-27 | Discharge: 2021-02-27 | Disposition: A | Discharge status: Home / Self Care | Payer: 59 | Source: Ambulatory Visit | Attending: Interventional Cardiology | Admitting: Interventional Cardiology

## 2021-02-27 DIAGNOSIS — E7849 Other hyperlipidemia: Secondary | ICD-10-CM | POA: Diagnosis not present

## 2021-02-27 DIAGNOSIS — I25700 Atherosclerosis of coronary artery bypass graft(s), unspecified, with unstable angina pectoris: Secondary | ICD-10-CM

## 2021-02-27 DIAGNOSIS — I10 Essential (primary) hypertension: Secondary | ICD-10-CM

## 2021-02-27 MED ORDER — DIPHENHYDRAMINE HCL 25 MG PO CAPS: 50.0000 mg | ORAL_CAPSULE | Freq: Once | ORAL | Status: DC

## 2021-02-27 MED ORDER — DIPHENHYDRAMINE HCL 50 MG/ML IJ SOLN: 50.0000 mg | Freq: Once | INTRAMUSCULAR | Status: DC

## 2021-02-27 MED ORDER — IOHEXOL 350 MG/ML SOLN
100.0000 mL | Freq: Once | INTRAVENOUS | Status: AC | PRN
  Administered 2021-02-27: 100 mL via INTRAVENOUS

## 2021-02-27 MED ORDER — PREDNISONE 1 MG PO TABS: 50.0000 mg | ORAL_TABLET | Freq: Four times a day (QID) | ORAL | Status: DC

## 2021-02-28 ENCOUNTER — Encounter: Payer: Self-pay | Admitting: Interventional Cardiology

## 2021-02-28 NOTE — Telephone Encounter (Signed)
Pt aware ct has not been reviewed at this time by Dr Katrinka Blazing Pt understands will call once is reviewed ./cy

## 2021-04-11 ENCOUNTER — Encounter (HOSPITAL_COMMUNITY): Payer: 59

## 2021-04-17 ENCOUNTER — Non-Acute Institutional Stay (HOSPITAL_COMMUNITY)
Admission: RE | Admit: 2021-04-17 | Discharge: 2021-04-17 | Disposition: A | Discharge status: Home / Self Care | Payer: 59 | Source: Ambulatory Visit | Attending: Internal Medicine | Admitting: Internal Medicine

## 2021-04-17 ENCOUNTER — Other Ambulatory Visit: Payer: Self-pay

## 2021-04-17 DIAGNOSIS — D509 Iron deficiency anemia, unspecified: Secondary | ICD-10-CM | POA: Diagnosis present

## 2021-04-17 DIAGNOSIS — Z9884 Bariatric surgery status: Secondary | ICD-10-CM | POA: Diagnosis not present

## 2021-04-17 MED ORDER — SODIUM CHLORIDE 0.9 % IV SOLN: INTRAVENOUS | Status: DC | PRN

## 2021-04-17 MED ORDER — SODIUM CHLORIDE 0.9 % IV SOLN
510.0000 mg | Freq: Once | INTRAVENOUS | Status: AC
  Administered 2021-04-17: 510 mg via INTRAVENOUS
  Filled 2021-04-17: qty 510

## 2021-04-17 NOTE — Progress Notes (Addendum)
PATIENT CARE CENTER NOTE: ? ?Diagnosis: chronic iron deficiency anemia ? ?Provider: Gweneth Dimitri MD ? ?Procedure: Feraheme 510mg  ? ? ?Patient received IV Feraheme ( dose #1 of 2) . No premeds required per orders. Observed for at least 30 minutes post infusion. Tolerated well, vitals stable, discharge instructions given , verbalized understanding. Pt scheduled to RTC 3/14 for second infusion, verbalized understanding. Patient alert, oriented, and ambulatory at the time of discharge.   ?

## 2021-04-23 ENCOUNTER — Non-Acute Institutional Stay (HOSPITAL_COMMUNITY)
Admission: RE | Admit: 2021-04-23 | Discharge: 2021-04-23 | Disposition: A | Discharge status: Home / Self Care | Payer: 59 | Source: Ambulatory Visit | Attending: Internal Medicine | Admitting: Internal Medicine

## 2021-04-23 ENCOUNTER — Other Ambulatory Visit: Payer: Self-pay

## 2021-04-23 DIAGNOSIS — D509 Iron deficiency anemia, unspecified: Secondary | ICD-10-CM | POA: Insufficient documentation

## 2021-04-23 MED ORDER — SODIUM CHLORIDE 0.9 % IV SOLN: INTRAVENOUS | Status: DC | PRN

## 2021-04-23 MED ORDER — SODIUM CHLORIDE 0.9 % IV SOLN
510.0000 mg | Freq: Once | INTRAVENOUS | Status: AC
  Administered 2021-04-23: 510 mg via INTRAVENOUS
  Filled 2021-04-23: qty 510

## 2021-04-23 NOTE — Progress Notes (Signed)
PATIENT CARE CENTER NOTE ? ? ?Diagnosis: Chronic iron-deficiency anemia  ? ? ?Provider: Gweneth Dimitri, MD ? ? ?Procedure: Feraheme infusion  ? ? ?Note:  Patient received Feraheme infusion (dose # 2 of 2) via PIV. Tolerated well with no adverse reaction. Observed patient for 30 minutes post infusion. Vital signs stable. AVS offered but patient refused. Patient alert, oriented and ambulatory at discharge.   ?

## 2021-07-05 ENCOUNTER — Ambulatory Visit (INDEPENDENT_AMBULATORY_CARE_PROVIDER_SITE_OTHER): Payer: 59

## 2021-07-05 ENCOUNTER — Ambulatory Visit (INDEPENDENT_AMBULATORY_CARE_PROVIDER_SITE_OTHER): Payer: 59 | Admitting: Physician Assistant

## 2021-07-05 DIAGNOSIS — G8929 Other chronic pain: Secondary | ICD-10-CM | POA: Diagnosis not present

## 2021-07-05 DIAGNOSIS — M25561 Pain in right knee: Secondary | ICD-10-CM

## 2021-07-05 DIAGNOSIS — M25572 Pain in left ankle and joints of left foot: Secondary | ICD-10-CM

## 2021-07-05 MED ORDER — BUPIVACAINE HCL 0.25 % IJ SOLN
2.0000 mL | INTRAMUSCULAR | Status: AC | PRN
  Administered 2021-07-05: 2 mL via INTRA_ARTICULAR

## 2021-07-05 MED ORDER — LIDOCAINE HCL 1 % IJ SOLN
3.0000 mL | INTRAMUSCULAR | Status: AC | PRN
  Administered 2021-07-05: 3 mL

## 2021-07-05 MED ORDER — METHYLPREDNISOLONE ACETATE 40 MG/ML IJ SUSP
80.0000 mg | INTRAMUSCULAR | Status: AC | PRN
  Administered 2021-07-05: 80 mg via INTRA_ARTICULAR

## 2021-07-05 NOTE — Progress Notes (Signed)
Office Visit Note   Patient: Sierra Lucas           Date of Birth: 1956/01/31           MRN: [id number] Visit Date: 07/05/2021              Requested by: Cari Caraway, [place],  [place] [address] PCP: Cari Caraway, MD  Chief Complaint  Patient presents with  . Left Foot - Pain  . Right Knee - Pain      HPI: Patient is a very pleasant 66 year old woman who presents with 2 problems right knee pain and left foot pain.  She denies any injuries.  Regarding her right knee she is status post knee arthroscopy a few years ago in [place].  She thinks she had some meniscus taken out at the time.  She went to the beach recently and is starting to have more pain in her knee.  She said most of the pain is toward the center of her knee and she thinks this began with a slight twisting motion.  She cannot take any anti-inflammatories because of previous weight loss surgery. Regarding her left foot again she was wearing flip-flops while she was at the beach and she developed pain mostly on the inside of her foot and her arch and her ankle.  She did have a cam walker boot at home and when she was wearing this it seemed to be better.  Assessment & Plan: Visit Diagnoses:  1. Pain in left ankle and joints of left foot   2. 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  We went forward with aspiration and injection of some steroid.  We will see how she does over the next few weeks and can follow-up if not better. With regards to her left foot things are most consistent with posterior tibial tendinitis.  She has had some collapse of her arch.  I have recommended continued immobilization in the boot but to add a full-length arch support like a sole to it.  She may follow-up in a few weeks with Dr. Sharol Given for reevaluation  Follow-Up Instructions: No follow-ups on file.   Ortho Exam  Patient is alert,  oriented, no adenopathy, well-dressed, normal affect, normal respiratory effort. Right knee she has valgus deformity.  She does have some warmth but not much warmer than the other knee no redness no cellulitis.  She does have a palpable large effusion.  She has tenderness over the medial anterior part of the joint more than laterally also some tenderness over the patellofemoral joint. Left foot she does have some pes planus she is tender along the posterior tibial tendon though has good function.  She has good dorsiflexion plantarflexion eversion inversion.  Also some mild lateral impingement findings  Imaging: XR Foot Complete Left  Result Date: 07/05/2021 Radiographs 3 projections of her left foot demonstrate overall well-maintained alignment she does have degenerative changes within the midfoot.  Calcaneal plantar osteophyte and calcification within the plantar fascia no acute fracture  XR KNEE 3 VIEW RIGHT  Result Date: 07/05/2021 Radiographs of her right knee were obtained in 3 projections today.  She does have slight valgus alignment.  She has some periarticular osteophytes especially on the lateral compartment.  With joint space narrowing some subsclerotic changes.  Also some sclerotic changes of the patellofemoral joint.  Medial joint is more preserved  no acute fractures  No images are attached to the encounter.  Labs: Lab Results  Component Value Date   REPTSTATUS 06/13/2010 FINAL 06/11/2010   REPTSTATUS 06/16/2010 FINAL 06/11/2010   GRAMSTAIN  06/11/2010    RARE WBC PRESENT, PREDOMINANTLY MONONUCLEAR NO SQUAMOUS EPITHELIAL CELLS SEEN NO ORGANISMS SEEN   GRAMSTAIN  06/11/2010    RARE WBC PRESENT, PREDOMINANTLY MONONUCLEAR NO SQUAMOUS EPITHELIAL CELLS SEEN NO ORGANISMS SEEN   CULT NO GROWTH 2 DAYS 06/11/2010   CULT NO ANAEROBES ISOLATED 06/11/2010     Lab Results  Component Value Date   ALBUMIN 3.2 (L) 01/07/2017   ALBUMIN 3.5 04/28/2013   ALBUMIN 3.8 11/18/2009    Lab  Results  Component Value Date   MG 2.3 01/09/2017   No results found for: VD25OH  No results found for: PREALBUMIN    Latest Ref Rng & Units 01/09/2017   12:00 AM 01/08/2017    6:03 PM 01/08/2017    7:45 AM  CBC EXTENDED  WBC 4.0 - 10.5 K/uL 10.7   11.9   8.8    RBC 3.87 - 5.11 MIL/uL 2.79   2.93   2.70    Hemoglobin 12.0 - 15.0 g/dL 8.3   8.6   8.0    HCT 36.0 - 46.0 % 24.6   26.2   23.9    Platelets 150 - 400 K/uL 229   217   191       There is no height or weight on file to calculate BMI.  Orders:  Orders Placed This Encounter  Procedures  . XR KNEE 3 VIEW RIGHT  . XR Foot Complete Left   No orders of the defined types were placed in this encounter.    Procedures: Large Joint Inj: R knee on 07/05/2021 3:49 PM Indications: pain and diagnostic evaluation Details: 25 G 1.5 in needle, superior approach  Arthrogram: No  Medications: 80 mg methylPREDNISolone acetate 40 MG/ML; 3 mL lidocaine 1 %; 2 mL bupivacaine 0.25 % Aspirate: 60 mL yellow Outcome: tolerated well, no immediate complications  After verbal consent was obtained the medial superior aspect of the patella was prepped with alcohol and Betadine.  Following this 3 cc of lidocaine was injected in the suprapatella pouch.  After adequate anesthesia was achieved an 18-gauge needle was inserted.  60 cc of yellow fluid was aspirated.  Following injection of 80 mg of Depo-Medrol and 2 cc of bupivacaine patient tolerated this well Procedure, treatment alternatives, risks and benefits explained, specific risks discussed. Consent was given by the patient.     Clinical Data: No additional findings.  ROS:  All other systems negative, except as noted in the HPI. Review of Systems  Objective: Vital Signs: There were no vitals taken for this visit.  Specialty Comments:  No specialty comments available.  PMFS History: Patient Active Problem List   Diagnosis Date Noted  . Knee pain 08/10/2017  . Gastrointestinal  hemorrhage with melena 01/07/2017  . Acute blood loss anemia 01/07/2017  . CAD (coronary artery disease) of artery bypass graft 10/11/2013  . Systemic hypertension 10/11/2013  . Hyperlipidemia 10/11/2013  . Hidradenitis suppurativa 10/06/2011  . Menopause 07/30/2010  . Wears glasses 07/30/2010  . Family history of breast cancer 07/30/2010   Past Medical History:  Diagnosis Date  . Abnormal Pap smear 06/19/2005   ASCUS   . Coronary artery disease   . GI (gastrointestinal bleed) 01/08/2017  . Headache(784.0)   . HGSIL (high grade squamous intraepithelial dysplasia) 2005  .  Systemic hypertension 10/11/2013   PATIENT DENIES    Family History  Problem Relation Age of Onset  . Heart disease Father     Past Surgical History:  Procedure Laterality Date  . BLADDER SURGERY  2004   Ask patient to clarify. Not on history form.  Marland Kitchen CARDIAC SURGERY  2000   Bypass surgery.  . CYSTECTOMY  06/11/10   REMOVED ON BUTTOCKS   . ESOPHAGOGASTRODUODENOSCOPY (EGD) WITH PROPOFOL Left 01/08/2017   Procedure: ESOPHAGOGASTRODUODENOSCOPY (EGD) WITH PROPOFOL;  Surgeon: Ronnette Juniper, MD;  Location: [place];  Service: Gastroenterology;  Laterality: Left;  . GALLBLADDER SURGERY  1981   Removal? Ask patient to clarify.  Marland Kitchen GASTRIC BYPASS  06/24/2012  . INGUINAL HIDRADENITIS EXCISION  2013  . NOVASURE ABLATION  07/18/2005  . TUBAL LIGATION     Social History   Occupational History  . Not on file  Tobacco Use  . Smoking status: Never  . Smokeless tobacco: Never  Vaping Use  . Vaping Use: Never used  Substance and Sexual Activity  . Alcohol use: Yes    Comment: two per month.  . Drug use: No  . Sexual activity: Yes    Birth control/protection: Surgical    Comment: BTL/ABLATION

## 2021-07-22 ENCOUNTER — Ambulatory Visit (INDEPENDENT_AMBULATORY_CARE_PROVIDER_SITE_OTHER): Payer: 59 | Admitting: Orthopedic Surgery

## 2021-07-22 DIAGNOSIS — M76822 Posterior tibial tendinitis, left leg: Secondary | ICD-10-CM | POA: Diagnosis not present

## 2021-07-23 ENCOUNTER — Encounter: Payer: Self-pay | Admitting: Orthopedic Surgery

## 2021-07-23 NOTE — Progress Notes (Signed)
Office Visit Note   Patient: Sierra Lucas           Date of Birth: 12-12-1955           MRN: [id number] Visit Date: 07/22/2021              Requested by: Cari Caraway, [place],  [place] [address] PCP: Cari Caraway, MD  Chief Complaint  Patient presents with   Left Foot - Pain      HPI: Patient is a 66 year old woman who is seen for initial evaluation for posterior tibial tendon dysfunction on the left.  Patient has had symptoms for 5 weeks.  She is currently in a fracture boot on the left wearing flip-flops on the right.  Patient states she does not have pain while wearing the short fracture boot.  She states she cannot go without it.    Assessment & Plan: Visit Diagnoses:  1. Posterior tibial tendon dysfunction, left     Plan: Recommended fascial strengthening arch supports stiff soled shoes and Voltaren gel.  Discussed that if she fails conservative therapy surgical intervention would be to fuse the talonavicular and subtalar joint.  Discussed that she would be off her foot for about 2 months with about 4 months total healing time.  Follow-Up Instructions: Return if symptoms worsen or fail to improve.   Ortho Exam  Patient is alert, oriented, no adenopathy, well-dressed, normal affect, normal respiratory effort. Examination patient has good pulses the posterior tibial tendon is tender to palpation she has pronation and valgus of the foot.  She cannot do a single limb heel raise.  Imaging: No results found. No images are attached to the encounter.  Labs: Lab Results  Component Value Date   REPTSTATUS 06/13/2010 FINAL 06/11/2010   REPTSTATUS 06/16/2010 FINAL 06/11/2010   GRAMSTAIN  06/11/2010    RARE WBC PRESENT, PREDOMINANTLY MONONUCLEAR NO SQUAMOUS EPITHELIAL CELLS SEEN NO ORGANISMS SEEN   GRAMSTAIN  06/11/2010    RARE WBC PRESENT, PREDOMINANTLY MONONUCLEAR NO SQUAMOUS EPITHELIAL CELLS SEEN NO ORGANISMS SEEN   CULT NO GROWTH 2 DAYS  06/11/2010   CULT NO ANAEROBES ISOLATED 06/11/2010     Lab Results  Component Value Date   ALBUMIN 3.2 (L) 01/07/2017   ALBUMIN 3.5 04/28/2013   ALBUMIN 3.8 11/18/2009    Lab Results  Component Value Date   MG 2.3 01/09/2017   No results found for: "VD25OH"  No results found for: "PREALBUMIN"    Latest Ref Rng & Units 01/09/2017   12:00 AM 01/08/2017    6:03 PM 01/08/2017    7:45 AM  CBC EXTENDED  WBC 4.0 - 10.5 K/uL 10.7  11.9  8.8   RBC 3.87 - 5.11 MIL/uL 2.79  2.93  2.70   Hemoglobin 12.0 - 15.0 g/dL 8.3  8.6  8.0   HCT 36.0 - 46.0 % 24.6  26.2  23.9   Platelets 150 - 400 K/uL 229  217  191      There is no height or weight on file to calculate BMI.  Orders:  No orders of the defined types were placed in this encounter.  No orders of the defined types were placed in this encounter.    Procedures: No procedures performed  Clinical Data: No additional findings.  ROS:  All other systems negative, except as noted in the HPI. Review of Systems  Objective: Vital Signs: There were no vitals taken for this visit.  Specialty Comments:  No specialty  comments available.  PMFS History: Patient Active Problem List   Diagnosis Date Noted   Pain in left ankle and joints of left foot 07/05/2021   Knee pain 08/10/2017   Gastrointestinal hemorrhage with melena 01/07/2017   Acute blood loss anemia 01/07/2017   CAD (coronary artery disease) of artery bypass graft 10/11/2013   Systemic hypertension 10/11/2013   Hyperlipidemia 10/11/2013   Hidradenitis suppurativa 10/06/2011   Menopause 07/30/2010   Wears glasses 07/30/2010   Family history of breast cancer 07/30/2010   Past Medical History:  Diagnosis Date   Abnormal Pap smear 06/19/2005   ASCUS    Coronary artery disease    GI (gastrointestinal bleed) 01/08/2017   Headache(784.0)    HGSIL (high grade squamous intraepithelial dysplasia) 2005   Systemic hypertension 10/11/2013   PATIENT DENIES    Family  History  Problem Relation Age of Onset   Heart disease Father     Past Surgical History:  Procedure Laterality Date   BLADDER SURGERY  2004   Ask patient to clarify. Not on history form.   CARDIAC SURGERY  2000   Bypass surgery.   CYSTECTOMY  06/11/10   REMOVED ON BUTTOCKS    ESOPHAGOGASTRODUODENOSCOPY (EGD) WITH PROPOFOL Left 01/08/2017   Procedure: ESOPHAGOGASTRODUODENOSCOPY (EGD) WITH PROPOFOL;  Surgeon: Ronnette Juniper, MD;  Location: [facility];  Service: Gastroenterology;  Laterality: Left;   GALLBLADDER SURGERY  1981   Removal? Ask patient to clarify.   GASTRIC BYPASS  06/24/2012   INGUINAL HIDRADENITIS EXCISION  2013   NOVASURE ABLATION  07/18/2005   TUBAL LIGATION     Social History   Occupational History   Not on file  Tobacco Use   Smoking status: Never   Smokeless tobacco: Never  Vaping Use   Vaping Use: Never used  Substance and Sexual Activity   Alcohol use: Yes    Comment: two per month.   Drug use: No   Sexual activity: Yes    Birth control/protection: Surgical    Comment: BTL/ABLATION

## 2021-08-26 ENCOUNTER — Ambulatory Visit (INDEPENDENT_AMBULATORY_CARE_PROVIDER_SITE_OTHER): Payer: 59 | Admitting: Physician Assistant

## 2021-08-26 ENCOUNTER — Encounter: Payer: Self-pay | Admitting: Physician Assistant

## 2021-08-26 DIAGNOSIS — M25561 Pain in right knee: Secondary | ICD-10-CM | POA: Diagnosis not present

## 2021-08-26 MED ORDER — LIDOCAINE HCL 1 % IJ SOLN
3.0000 mL | INTRAMUSCULAR | Status: AC | PRN
  Administered 2021-08-26: 3 mL

## 2021-08-26 MED ORDER — METHYLPREDNISOLONE ACETATE 40 MG/ML IJ SUSP
80.0000 mg | INTRAMUSCULAR | Status: AC | PRN
  Administered 2021-08-26: 80 mg via INTRA_ARTICULAR

## 2021-08-26 NOTE — Progress Notes (Signed)
Office Visit Note   Patient: Sierra Lucas           Date of Birth: 12-27-1955           MRN: [id number] Visit Date: 08/26/2021              Requested by: Gweneth Dimitri, MD [address] [place],  [place] [address] PCP: Gweneth Dimitri, MD  Chief Complaint  Patient presents with   Right Knee - Follow-up      HPI: Patient is a pleasant 66 year old woman who follows up today with a right knee pain.  At her last visit 2 months ago, she had a significant effusion of 60 cc of light yellow fluid.  She was injected with steroid.  This helped her Quite a bit.  She has now had the return of some swelling in the knee.  She denies any injuries.  While she is here she also is talking about pain on the outside of her left hip  Assessment & Plan: Visit Diagnoses:  1. Right knee pain, unspecified chronicity     Plan: I did aspirate her knee today but could not aspirate any fluid I did inject her with some steroid and hopefully this will help her.  She will come back in a couple weeks for reevaluation.  She did not really have any warmth in the knee or concerns for infection.  She would like to have her left hip evaluated which I think will be most likely a trochanteric bursitis.  Certainly we could try injecting that when she comes back in 2 weeks.  Follow-Up Instructions: No follow-ups on file.   Ortho Exam  Patient is alert, oriented, no adenopathy, well-dressed, normal affect, normal respiratory effort. Examination of her right knee she does have some swelling and may be a small effusion.  No warmth no redness she is tender over the medial lateral joint lines.  Crepitus with range of motion. Examination of her hip she is tender over the trochanteric bursa.  No real pain with manipulation of the hip  Imaging: No results found. No images are attached to the encounter.  Labs: Lab Results  Component Value Date   REPTSTATUS 06/13/2010 FINAL 06/11/2010   REPTSTATUS 06/16/2010 FINAL  06/11/2010   GRAMSTAIN  06/11/2010    RARE WBC PRESENT, PREDOMINANTLY MONONUCLEAR NO SQUAMOUS EPITHELIAL CELLS SEEN NO ORGANISMS SEEN   GRAMSTAIN  06/11/2010    RARE WBC PRESENT, PREDOMINANTLY MONONUCLEAR NO SQUAMOUS EPITHELIAL CELLS SEEN NO ORGANISMS SEEN   CULT NO GROWTH 2 DAYS 06/11/2010   CULT NO ANAEROBES ISOLATED 06/11/2010     Lab Results  Component Value Date   ALBUMIN 3.2 (L) 01/07/2017   ALBUMIN 3.5 04/28/2013   ALBUMIN 3.8 11/18/2009    Lab Results  Component Value Date   MG 2.3 01/09/2017   No results found for: "VD25OH"  No results found for: "PREALBUMIN"    Latest Ref Rng & Units 01/09/2017   12:00 AM 01/08/2017    6:03 PM 01/08/2017    7:45 AM  CBC EXTENDED  WBC 4.0 - 10.5 K/uL 10.7  11.9  8.8   RBC 3.87 - 5.11 MIL/uL 2.79  2.93  2.70   Hemoglobin 12.0 - 15.0 g/dL 8.3  8.6  8.0   HCT 77.8 - 46.0 % 24.6  26.2  23.9   Platelets 150 - 400 K/uL 229  217  191      There is no height or weight on file to calculate  BMI.  Orders:  No orders of the defined types were placed in this encounter.  No orders of the defined types were placed in this encounter.    Procedures: Large Joint Inj: R knee on 08/26/2021 4:35 PM Indications: pain and diagnostic evaluation Details: 25 G 1.5 in needle, superior approach  Arthrogram: No  Medications: 80 mg methylPREDNISolone acetate 40 MG/ML; 3 mL lidocaine 1 % Outcome: tolerated well, no immediate complications Procedure, treatment alternatives, risks and benefits explained, specific risks discussed. Consent was given by the patient.     Clinical Data: No additional findings.  ROS:  All other systems negative, except as noted in the HPI. Review of Systems  Objective: Vital Signs: There were no vitals taken for this visit.  Specialty Comments:  No specialty comments available.  PMFS History: Patient Active Problem List   Diagnosis Date Noted   Pain in left ankle and joints of left foot 07/05/2021    Knee pain 08/10/2017   Gastrointestinal hemorrhage with melena 01/07/2017   Acute blood loss anemia 01/07/2017   CAD (coronary artery disease) of artery bypass graft 10/11/2013   Systemic hypertension 10/11/2013   Hyperlipidemia 10/11/2013   Hidradenitis suppurativa 10/06/2011   Menopause 07/30/2010   Wears glasses 07/30/2010   Family history of breast cancer 07/30/2010   Past Medical History:  Diagnosis Date   Abnormal Pap smear 06/19/2005   ASCUS    Coronary artery disease    GI (gastrointestinal bleed) 01/08/2017   Headache(784.0)    HGSIL (high grade squamous intraepithelial dysplasia) 2005   Systemic hypertension 10/11/2013   PATIENT DENIES    Family History  Problem Relation Age of Onset   Heart disease Father     Past Surgical History:  Procedure Laterality Date   BLADDER SURGERY  2004   Ask patient to clarify. Not on history form.   CARDIAC SURGERY  2000   Bypass surgery.   CYSTECTOMY  06/11/10   REMOVED ON BUTTOCKS    ESOPHAGOGASTRODUODENOSCOPY (EGD) WITH PROPOFOL Left 01/08/2017   Procedure: ESOPHAGOGASTRODUODENOSCOPY (EGD) WITH PROPOFOL;  Surgeon: Kerin Salen, MD;  Location: [facility] ENDOSCOPY;  Service: Gastroenterology;  Laterality: Left;   GALLBLADDER SURGERY  1981   Removal? Ask patient to clarify.   GASTRIC BYPASS  06/24/2012   INGUINAL HIDRADENITIS EXCISION  2013   NOVASURE ABLATION  07/18/2005   TUBAL LIGATION     Social History   Occupational History   Not on file  Tobacco Use   Smoking status: Never   Smokeless tobacco: Never  Vaping Use   Vaping Use: Never used  Substance and Sexual Activity   Alcohol use: Yes    Comment: two per month.   Drug use: No   Sexual activity: Yes    Birth control/protection: Surgical    Comment: BTL/ABLATION

## 2021-09-10 ENCOUNTER — Ambulatory Visit (INDEPENDENT_AMBULATORY_CARE_PROVIDER_SITE_OTHER): Payer: 59 | Admitting: Orthopaedic Surgery

## 2021-09-10 ENCOUNTER — Encounter: Payer: Self-pay | Admitting: Orthopaedic Surgery

## 2021-09-10 DIAGNOSIS — M1711 Unilateral primary osteoarthritis, right knee: Secondary | ICD-10-CM | POA: Diagnosis not present

## 2021-09-10 MED ORDER — BUPIVACAINE HCL 0.25 % IJ SOLN
2.0000 mL | INTRAMUSCULAR | Status: AC | PRN
  Administered 2021-09-10: 2 mL via INTRA_ARTICULAR

## 2021-09-10 MED ORDER — METHYLPREDNISOLONE ACETATE 40 MG/ML IJ SUSP
60.0000 mg | INTRAMUSCULAR | Status: AC | PRN
  Administered 2021-09-10: 60 mg via INTRA_ARTICULAR

## 2021-09-10 MED ORDER — LIDOCAINE HCL 1 % IJ SOLN
4.0000 mL | INTRAMUSCULAR | Status: AC | PRN
  Administered 2021-09-10: 4 mL

## 2021-09-10 NOTE — Addendum Note (Signed)
Addended by: Shonna Chock on: 09/10/2021 04:52 PM   Modules accepted: Orders

## 2021-09-10 NOTE — Progress Notes (Signed)
Office Visit Note   Patient: Sierra Lucas           Date of Birth: 1955-02-16           MRN: [id number] Visit Date: 09/10/2021              Requested by: Gweneth Dimitri, MD [address] [place],  [place] [address] PCP: Gweneth Dimitri, MD  Chief Complaint  Patient presents with   Right Knee - Follow-up      HPI: Patient is a pleasant 66 year old woman who follows up today for her right knee pain and swelling.  Back in May she had had pain in her knee after being at the beach.  At that time 60 cc of serous fluid was removed from the knee and she was injected with steroid.  She did get quite a bit better.  3 weeks ago she had the return of some pain in her knee though not much fluid.  She was injected with some steroid but was unable to aspirate any fluid.  She returns today because she is has more swelling in her knee.  It is quite painful and tight to her.  She denies any recent injury.  She denies any fever chills or recent illness  Assessment & Plan: Visit Diagnoses:  1. Unilateral primary osteoarthritis, right knee     Plan: Arthritis right knee.  We did go forward and aspirate and aspirated 50 cc of serous fluid.  This was sent for culture cell count and crystal.  She does have quite a bit of arthritis in her knee with some valgus alignment.  X-rays today demonstrate periarticular osteophyte formation and sclerotic changes especially of the lateral compartment.  Follow-Up Instructions: Return if symptoms worsen or fail to improve.   Ortho Exam  Patient is alert, oriented, no adenopathy, well-dressed, normal affect, normal respiratory effort. Examination of her right knee she has some mild warmth but no erythema no cellulitis.  She does have a moderate effusion.  She does have some crepitation with range of motion  Imaging: No results found. No images are attached to the encounter.  Labs: Lab Results  Component Value Date   REPTSTATUS 06/13/2010 FINAL 06/11/2010    REPTSTATUS 06/16/2010 FINAL 06/11/2010   GRAMSTAIN  06/11/2010    RARE WBC PRESENT, PREDOMINANTLY MONONUCLEAR NO SQUAMOUS EPITHELIAL CELLS SEEN NO ORGANISMS SEEN   GRAMSTAIN  06/11/2010    RARE WBC PRESENT, PREDOMINANTLY MONONUCLEAR NO SQUAMOUS EPITHELIAL CELLS SEEN NO ORGANISMS SEEN   CULT NO GROWTH 2 DAYS 06/11/2010   CULT NO ANAEROBES ISOLATED 06/11/2010     Lab Results  Component Value Date   ALBUMIN 3.2 (L) 01/07/2017   ALBUMIN 3.5 04/28/2013   ALBUMIN 3.8 11/18/2009    Lab Results  Component Value Date   MG 2.3 01/09/2017   No results found for: "VD25OH"  No results found for: "PREALBUMIN"    Latest Ref Rng & Units 01/09/2017   12:00 AM 01/08/2017    6:03 PM 01/08/2017    7:45 AM  CBC EXTENDED  WBC 4.0 - 10.5 K/uL 10.7  11.9  8.8   RBC 3.87 - 5.11 MIL/uL 2.79  2.93  2.70   Hemoglobin 12.0 - 15.0 g/dL 8.3  8.6  8.0   HCT 03.4 - 46.0 % 24.6  26.2  23.9   Platelets 150 - 400 K/uL 229  217  191      There is no height or weight on file to calculate BMI.  Orders:  Orders Placed This Encounter  Procedures   Large Joint Inj: R knee   No orders of the defined types were placed in this encounter.    Procedures: Large Joint Inj: R knee on 09/10/2021 3:46 PM Indications: pain and diagnostic evaluation Details: 25 G 1.5 in needle, superior approach  Arthrogram: No  Medications: 60 mg methylPREDNISolone acetate 40 MG/ML; 4 mL lidocaine 1 %; 2 mL bupivacaine 0.25 % Aspirate: 50 mL serous Outcome: tolerated well, no immediate complications  After obtaining verbal consent and sterilely prepping with alcohol followed by Betadine over the superior medial patellar pole she was injected with 3 cc of lidocaine plain.  After adequate anesthetic was achieved 18-gauge needle was in injected and aspirated 50 cc of serous fluid.  Following this 60 cc of Depo-Medrol was injected.  She tolerated the procedure well and felt much better Procedure, treatment alternatives,  risks and benefits explained, specific risks discussed. Consent was given by the patient.     Clinical Data: No additional findings.  ROS:  All other systems negative, except as noted in the HPI. Review of Systems  Objective: Vital Signs: There were no vitals taken for this visit.  Specialty Comments:  No specialty comments available.  PMFS History: Patient Active Problem List   Diagnosis Date Noted   Unilateral primary osteoarthritis, right knee 09/10/2021   Pain in left ankle and joints of left foot 07/05/2021   Knee pain 08/10/2017   Gastrointestinal hemorrhage with melena 01/07/2017   Acute blood loss anemia 01/07/2017   CAD (coronary artery disease) of artery bypass graft 10/11/2013   Systemic hypertension 10/11/2013   Hyperlipidemia 10/11/2013   Hidradenitis suppurativa 10/06/2011   Menopause 07/30/2010   Wears glasses 07/30/2010   Family history of breast cancer 07/30/2010   Past Medical History:  Diagnosis Date   Abnormal Pap smear 06/19/2005   ASCUS    Coronary artery disease    GI (gastrointestinal bleed) 01/08/2017   Headache(784.0)    HGSIL (high grade squamous intraepithelial dysplasia) 2005   Systemic hypertension 10/11/2013   PATIENT DENIES    Family History  Problem Relation Age of Onset   Heart disease Father     Past Surgical History:  Procedure Laterality Date   BLADDER SURGERY  2004   Ask patient to clarify. Not on history form.   CARDIAC SURGERY  2000   Bypass surgery.   CYSTECTOMY  06/11/10   REMOVED ON BUTTOCKS    ESOPHAGOGASTRODUODENOSCOPY (EGD) WITH PROPOFOL Left 01/08/2017   Procedure: ESOPHAGOGASTRODUODENOSCOPY (EGD) WITH PROPOFOL;  Surgeon: Kerin Salen, MD;  Location: [facility] ENDOSCOPY;  Service: Gastroenterology;  Laterality: Left;   GALLBLADDER SURGERY  1981   Removal? Ask patient to clarify.   GASTRIC BYPASS  06/24/2012   INGUINAL HIDRADENITIS EXCISION  2013   NOVASURE ABLATION  07/18/2005   TUBAL LIGATION     Social History    Occupational History   Not on file  Tobacco Use   Smoking status: Never   Smokeless tobacco: Never  Vaping Use   Vaping Use: Never used  Substance and Sexual Activity   Alcohol use: Yes    Comment: two per month.   Drug use: No   Sexual activity: Yes    Birth control/protection: Surgical    Comment: BTL/ABLATION

## 2021-09-16 LAB — BODY FLUID CULTURE

## 2021-09-17 ENCOUNTER — Telehealth: Payer: Self-pay | Admitting: Orthopaedic Surgery

## 2021-09-17 NOTE — Telephone Encounter (Signed)
No ma'am! She did not give me that information

## 2021-09-17 NOTE — Telephone Encounter (Signed)
[facility] called to speak with Sierra Lucas about a form that was faxed over for this pt. She states the fax said the lab interface was denied by [facility].   Cb [phone]

## 2021-10-01 LAB — SYNOVIAL FLUID, CELL COUNT

## 2021-10-01 LAB — SPECIMEN STATUS REPORT

## 2021-10-28 ENCOUNTER — Ambulatory Visit (INDEPENDENT_AMBULATORY_CARE_PROVIDER_SITE_OTHER): Payer: Commercial Managed Care - HMO | Admitting: Physician Assistant

## 2021-10-28 ENCOUNTER — Encounter: Payer: Self-pay | Admitting: Physician Assistant

## 2021-10-28 VITALS — Ht 63.0 in | Wt 197.0 lb

## 2021-10-28 DIAGNOSIS — M1711 Unilateral primary osteoarthritis, right knee: Secondary | ICD-10-CM | POA: Diagnosis not present

## 2021-10-28 MED ORDER — METHYLPREDNISOLONE ACETATE 40 MG/ML IJ SUSP
80.0000 mg | INTRAMUSCULAR | Status: AC | PRN
  Administered 2021-10-28: 80 mg via INTRA_ARTICULAR

## 2021-10-28 MED ORDER — LIDOCAINE HCL 1 % IJ SOLN
3.0000 mL | INTRAMUSCULAR | Status: AC | PRN
  Administered 2021-10-28: 3 mL

## 2021-10-28 NOTE — Progress Notes (Signed)
Office Visit Note   Patient: Sierra Lucas           Date of Birth: [birth year]           MRN: [id number] Visit Date: 10/28/2021              Requested by: Cari Caraway, [place],  [place] [address] PCP: Cari Caraway, MD  Chief Complaint  Patient presents with  . Right Knee - Pain      HPI: Patient is a pleasant 66 year old woman with a history of effusions and osteoarthritis of her right knee.  She is in the process of moving and over the weekend developed swelling and pain in her right knee.  Denies any fever or chills.  She asking to have her knee aspirated and injected today.  Assessment & Plan: Visit Diagnoses:  1. Unilateral primary osteoarthritis, right knee     Plan: She certainly has a known fusion I was not able to aspirated despite 2 attempts.  I did inject her with some cortisone.  Like to have her come back on Thursday see if Dr. Durward Fortes can aspirate.  She is in agreement with this plan.  She also is considering knee replacement which originally she did not want to do till next year in September now she is thinking more in January.  Her BMI is 34.9 she is not diabetic.  Discussed with her that we could refer her to Dr. Ninfa Linden  Follow-Up Instructions: No follow-ups on file.   Ortho Exam  Patient is alert, oriented, no adenopathy, well-dressed, normal affect, normal respiratory effort. Examination of her right knee no redness no erythema she does have an effusion.  Compartments are soft and nontender negative Homans' sign.  Imaging: No results found. No images are attached to the encounter.  Labs: Lab Results  Component Value Date   REPTSTATUS 06/13/2010 FINAL 06/11/2010   REPTSTATUS 06/16/2010 FINAL 06/11/2010   GRAMSTAIN  06/11/2010    RARE WBC PRESENT, PREDOMINANTLY MONONUCLEAR NO SQUAMOUS EPITHELIAL CELLS SEEN NO ORGANISMS SEEN   GRAMSTAIN  06/11/2010    RARE WBC PRESENT, PREDOMINANTLY MONONUCLEAR NO SQUAMOUS EPITHELIAL  CELLS SEEN NO ORGANISMS SEEN   CULT NO GROWTH 2 DAYS 06/11/2010   CULT NO ANAEROBES ISOLATED 06/11/2010     Lab Results  Component Value Date   ALBUMIN 3.2 (L) 01/07/2017   ALBUMIN 3.5 04/28/2013   ALBUMIN 3.8 11/18/2009    Lab Results  Component Value Date   MG 2.3 01/09/2017   No results found for: "VD25OH"  No results found for: "PREALBUMIN"    Latest Ref Rng & Units 01/09/2017   12:00 AM 01/08/2017    6:03 PM 01/08/2017    7:45 AM  CBC EXTENDED  WBC 4.0 - 10.5 K/uL 10.7  11.9  8.8   RBC 3.87 - 5.11 MIL/uL 2.79  2.93  2.70   Hemoglobin 12.0 - 15.0 g/dL 8.3  8.6  8.0   HCT 36.0 - 46.0 % 24.6  26.2  23.9   Platelets 150 - 400 K/uL 229  217  191      Body mass index is 34.9 kg/m.  Orders:  No orders of the defined types were placed in this encounter.  No orders of the defined types were placed in this encounter.    Procedures: Large Joint Inj on 10/28/2021 11:25 AM Indications: pain and diagnostic evaluation Details: 25 G 1.5 in needle, superior approach  Arthrogram: No  Medications: 80 mg methylPREDNISolone  acetate 40 MG/ML; 3 mL lidocaine 1 % Outcome: tolerated well, no immediate complications  After verbal consent was obtained the medial superior aspect of her knee was prepped with Betadine x2 and alcohol.  3 cc of lidocaine was injected.  Attempted to aspirate fluid only aspirated a scant amount of serous blood-tinged fluid.  Knee was reprepped and anesthetized.  On the medial side.  Still no fluid.  She was injected with a milligrams of Depo-Medrol Procedure, treatment alternatives, risks and benefits explained, specific risks discussed. Consent was given by the patient.    Clinical Data: No additional findings.  ROS:  All other systems negative, except as noted in the HPI. Review of Systems  Objective: Vital Signs: Ht 5\' 3"  (1.6 m)   Wt 197 lb (89.4 kg)   BMI 34.90 kg/m   Specialty Comments:  No specialty comments available.  PMFS  History: Patient Active Problem List   Diagnosis Date Noted  . Unilateral primary osteoarthritis, right knee 09/10/2021  . Pain in left ankle and joints of left foot 07/05/2021  . Knee pain 08/10/2017  . Gastrointestinal hemorrhage with melena 01/07/2017  . Acute blood loss anemia 01/07/2017  . CAD (coronary artery disease) of artery bypass graft 10/11/2013  . Systemic hypertension 10/11/2013  . Hyperlipidemia 10/11/2013  . Hidradenitis suppurativa 10/06/2011  . Menopause 07/30/2010  . Wears glasses 07/30/2010  . Family history of breast cancer 07/30/2010   Past Medical History:  Diagnosis Date  . Abnormal Pap smear 06/19/2005   ASCUS   . Coronary artery disease   . GI (gastrointestinal bleed) 01/08/2017  . Headache(784.0)   . HGSIL (high grade squamous intraepithelial dysplasia) 2005  . Systemic hypertension 10/11/2013   PATIENT DENIES    Family History  Problem Relation Age of Onset  . Heart disease Father     Past Surgical History:  Procedure Laterality Date  . BLADDER SURGERY  2004   Ask patient to clarify. Not on history form.  Marland Kitchen CARDIAC SURGERY  2000   Bypass surgery.  . CYSTECTOMY  06/11/10   REMOVED ON BUTTOCKS   . ESOPHAGOGASTRODUODENOSCOPY (EGD) WITH PROPOFOL Left 01/08/2017   Procedure: ESOPHAGOGASTRODUODENOSCOPY (EGD) WITH PROPOFOL;  Surgeon: Ronnette Juniper, MD;  Location: [place];  Service: Gastroenterology;  Laterality: Left;  . GALLBLADDER SURGERY  1981   Removal? Ask patient to clarify.  Marland Kitchen GASTRIC BYPASS  06/24/2012  . INGUINAL HIDRADENITIS EXCISION  2013  . NOVASURE ABLATION  07/18/2005  . TUBAL LIGATION     Social History   Occupational History  . Not on file  Tobacco Use  . Smoking status: Never  . Smokeless tobacco: Never  Vaping Use  . Vaping Use: Never used  Substance and Sexual Activity  . Alcohol use: Yes    Comment: two per month.  . Drug use: No  . Sexual activity: Yes    Birth control/protection: Surgical    Comment: BTL/ABLATION

## 2021-10-31 ENCOUNTER — Encounter: Payer: Self-pay | Admitting: Orthopaedic Surgery

## 2021-10-31 ENCOUNTER — Ambulatory Visit (INDEPENDENT_AMBULATORY_CARE_PROVIDER_SITE_OTHER): Payer: Commercial Managed Care - HMO | Admitting: Orthopaedic Surgery

## 2021-10-31 DIAGNOSIS — M1711 Unilateral primary osteoarthritis, right knee: Secondary | ICD-10-CM

## 2021-10-31 NOTE — Progress Notes (Signed)
Office Visit Note   Patient: Sierra Lucas           Date of Birth: [birth year]           MRN: [id number] Visit Date: 10/31/2021              Requested by: Cari Caraway, [place],  [place] [address] PCP: Cari Caraway, MD   Assessment & Plan: Visit Diagnoses:  1. Unilateral primary osteoarthritis, right knee     Plan: Jamielee comes in today for aspiration of her knee.  This was attempted earlier this week and 80 mg of Depo-Medrol was injected.  She comes in today says she is feeling better and she thinks the swelling is not as significant as it was.  On exam she has had a significant decrease in the swelling secondary to the steroid.  After conversation it was decided to see how she does with just the steroid of course she can follow-up as needed.  Briefly discussed that at some point she will more than likely need a knee replacement when she does not get any relief.  Would refer her to Dr. Ninfa Linden  Follow-Up Instructions: No follow-ups on file.   Orders:  No orders of the defined types were placed in this encounter.  No orders of the defined types were placed in this encounter.     Procedures: No procedures performed   Clinical Data: No additional findings.   Subjective: Chief Complaint  Patient presents with   Right Knee - Follow-up  Patient presents today for follow up on right knee. She saw Bevely Palmer and is back for Dr.Whitfield to aspirate her knee.     Review of Systems  All other systems reviewed and are negative.    Objective: Vital Signs: There were no vitals taken for this visit.  Physical Exam Constitutional:      Appearance: Normal appearance.  Neurological:     Mental Status: She is alert.     Ortho Exam Examination of her knees she has a very minimal effusion if any.  She has some bruising for the previous aspiration.  Knee is not warm.  Compartments are soft. Specialty Comments:  No specialty comments  available.  Imaging: No results found.   PMFS History: Patient Active Problem List   Diagnosis Date Noted   Unilateral primary osteoarthritis, right knee 09/10/2021   Pain in left ankle and joints of left foot 07/05/2021   Knee pain 08/10/2017   Gastrointestinal hemorrhage with melena 01/07/2017   Acute blood loss anemia 01/07/2017   CAD (coronary artery disease) of artery bypass graft 10/11/2013   Systemic hypertension 10/11/2013   Hyperlipidemia 10/11/2013   Hidradenitis suppurativa 10/06/2011   Menopause 07/30/2010   Wears glasses 07/30/2010   Family history of breast cancer 07/30/2010   Past Medical History:  Diagnosis Date   Abnormal Pap smear 06/19/2005   ASCUS    Coronary artery disease    GI (gastrointestinal bleed) 01/08/2017   Headache(784.0)    HGSIL (high grade squamous intraepithelial dysplasia) 2005   Systemic hypertension 10/11/2013   PATIENT DENIES    Family History  Problem Relation Age of Onset   Heart disease Father     Past Surgical History:  Procedure Laterality Date   BLADDER SURGERY  2004   Ask patient to clarify. Not on history form.   CARDIAC SURGERY  2000   Bypass surgery.   CYSTECTOMY  06/11/10   REMOVED ON BUTTOCKS  ESOPHAGOGASTRODUODENOSCOPY (EGD) WITH PROPOFOL Left 01/08/2017   Procedure: ESOPHAGOGASTRODUODENOSCOPY (EGD) WITH PROPOFOL;  Surgeon: Ronnette Juniper, MD;  Location: [facility];  Service: Gastroenterology;  Laterality: Left;   GALLBLADDER SURGERY  1981   Removal? Ask patient to clarify.   GASTRIC BYPASS  06/24/2012   INGUINAL HIDRADENITIS EXCISION  2013   NOVASURE ABLATION  07/18/2005   TUBAL LIGATION     Social History   Occupational History   Not on file  Tobacco Use   Smoking status: Never   Smokeless tobacco: Never  Vaping Use   Vaping Use: Never used  Substance and Sexual Activity   Alcohol use: Yes    Comment: two per month.   Drug use: No   Sexual activity: Yes    Birth control/protection: Surgical    Comment:  BTL/ABLATION

## 2021-11-05 ENCOUNTER — Telehealth: Payer: Commercial Managed Care - HMO | Admitting: Nurse Practitioner

## 2021-11-05 DIAGNOSIS — N3000 Acute cystitis without hematuria: Secondary | ICD-10-CM | POA: Diagnosis not present

## 2021-11-05 MED ORDER — CEPHALEXIN 500 MG PO CAPS: 500.0000 mg | ORAL_CAPSULE | Freq: Two times a day (BID) | ORAL | 0 refills | Status: AC

## 2021-11-05 NOTE — Progress Notes (Signed)
E-Visit for Urinary Problems  We are sorry that you are not feeling well.  Here is how we plan to help!  Based on what you shared with me it looks like you most likely have a simple urinary tract infection.  A UTI (Urinary Tract Infection) is a bacterial infection of the bladder.  Most cases of urinary tract infections are simple to treat but a key part of your care is to encourage you to drink plenty of fluids and watch your symptoms carefully.  I have prescribed Keflex 500 mg twice a day for 7 days.  Your symptoms should gradually improve. Call us if the burning in your urine worsens, you develop worsening fever, back pain or pelvic pain or if your symptoms do not resolve after completing the antibiotic.  Urinary tract infections can be prevented by drinking plenty of water to keep your body hydrated.  Also be sure when you wipe, wipe from front to back and don't hold it in!  If possible, empty your bladder every 4 hours.  HOME CARE Drink plenty of fluids Compete the full course of the antibiotics even if the symptoms resolve Remember, when you need to go.go. Holding in your urine can increase the likelihood of getting a UTI! GET HELP RIGHT AWAY IF: You cannot urinate You get a high fever Worsening back pain occurs You see blood in your urine You feel sick to your stomach or throw up You feel like you are going to pass out  MAKE SURE YOU  Understand these instructions. Will watch your condition. Will get help right away if you are not doing well or get worse.   Thank you for choosing an e-visit.  Your e-visit answers were reviewed by a board certified advanced clinical practitioner to complete your personal care plan. Depending upon the condition, your plan could have included both over the counter or prescription medications.  Please review your pharmacy choice. Make sure the pharmacy is open so you can pick up prescription now. If there is a problem, you may contact your  provider through MyChart messaging and have the prescription routed to another pharmacy.  Your safety is important to us. If you have drug allergies check your prescription carefully.   For the next 24 hours you can use MyChart to ask questions about today's visit, request a non-urgent call back, or ask for a work or school excuse. You will get an email in the next two days asking about your experience. I hope that your e-visit has been valuable and will speed your recovery.   Meds ordered this encounter  Medications   cephALEXin (KEFLEX) 500 MG capsule    Sig: Take 1 capsule (500 mg total) by mouth 2 (two) times daily for 7 days.    Dispense:  14 capsule    Refill:  0    I spent approximately 5 minutes reviewing the patient's history, current symptoms and coordinating their plan of care today.   

## 2021-11-06 ENCOUNTER — Ambulatory Visit: Payer: Commercial Managed Care - HMO | Admitting: Orthopaedic Surgery

## 2021-12-30 ENCOUNTER — Other Ambulatory Visit: Payer: Self-pay | Admitting: Family Medicine

## 2021-12-30 DIAGNOSIS — R1030 Lower abdominal pain, unspecified: Secondary | ICD-10-CM

## 2022-01-06 ENCOUNTER — Ambulatory Visit (INDEPENDENT_AMBULATORY_CARE_PROVIDER_SITE_OTHER): Payer: Commercial Managed Care - HMO | Admitting: Physician Assistant

## 2022-01-06 DIAGNOSIS — M1711 Unilateral primary osteoarthritis, right knee: Secondary | ICD-10-CM | POA: Diagnosis not present

## 2022-01-06 MED ORDER — BUPIVACAINE HCL 0.25 % IJ SOLN
2.0000 mL | INTRAMUSCULAR | Status: AC | PRN
  Administered 2022-01-06: 2 mL via INTRA_ARTICULAR

## 2022-01-06 MED ORDER — METHYLPREDNISOLONE ACETATE 40 MG/ML IJ SUSP
80.0000 mg | INTRAMUSCULAR | Status: AC | PRN
  Administered 2022-01-06: 80 mg via INTRA_ARTICULAR

## 2022-01-06 MED ORDER — LIDOCAINE HCL 1 % IJ SOLN
4.0000 mL | INTRAMUSCULAR | Status: AC | PRN
  Administered 2022-01-06: 4 mL

## 2022-01-06 NOTE — Progress Notes (Signed)
Office Visit Note   Patient: Sierra Lucas           Date of Birth: 1955/08/26           MRN: [id number] Visit Date: 01/06/2022              Requested by: Gweneth Dimitri, MD [address] [place],  [place] [address] PCP: Gweneth Dimitri, MD  Chief Complaint  Patient presents with  . Right Knee - Pain      HPI: Sierra Lucas is a pleasant 66 year old woman with a known history of osteoarthritis of her right knee.  She has had aspirations and injections in the past that seem to have helped.  Her last aspiration did not produce any fluid but she did get significant improvement from steroid.  Requesting same today.  Assessment & Plan: Visit Diagnoses: Osteoarthritis right knee  Plan: I was unable to aspirate any fluid but did give her another steroid injection today.  She is planning on going forward next year with knee replacement.  I recommended to her that if the fluid reaccumulates or she wants another injection I would recommend her seeing Dr. Magnus Ivan so she could establish care with him as I think he would do a good job for her knee replacement  Follow-Up Instructions: No follow-ups on file.   Ortho Exam  Patient is alert, oriented, no adenopathy, well-dressed, normal affect, normal respiratory effort. No erythema mild effusion no warmth.  Pain over the medial lateral compartments.  Grinding with range of motion.  No find signs of infection.  Compartments are soft.  Imaging: No results found. No images are attached to the encounter.  Labs: Lab Results  Component Value Date   REPTSTATUS 06/13/2010 FINAL 06/11/2010   REPTSTATUS 06/16/2010 FINAL 06/11/2010   GRAMSTAIN  06/11/2010    RARE WBC PRESENT, PREDOMINANTLY MONONUCLEAR NO SQUAMOUS EPITHELIAL CELLS SEEN NO ORGANISMS SEEN   GRAMSTAIN  06/11/2010    RARE WBC PRESENT, PREDOMINANTLY MONONUCLEAR NO SQUAMOUS EPITHELIAL CELLS SEEN NO ORGANISMS SEEN   CULT NO GROWTH 2 DAYS 06/11/2010   CULT NO ANAEROBES ISOLATED  06/11/2010     Lab Results  Component Value Date   ALBUMIN 3.2 (L) 01/07/2017   ALBUMIN 3.5 04/28/2013   ALBUMIN 3.8 11/18/2009    Lab Results  Component Value Date   MG 2.3 01/09/2017   No results found for: "VD25OH"  No results found for: "PREALBUMIN"    Latest Ref Rng & Units 01/09/2017   12:00 AM 01/08/2017    6:03 PM 01/08/2017    7:45 AM  CBC EXTENDED  WBC 4.0 - 10.5 K/uL 10.7  11.9  8.8   RBC 3.87 - 5.11 MIL/uL 2.79  2.93  2.70   Hemoglobin 12.0 - 15.0 g/dL 8.3  8.6  8.0   HCT 62.1 - 46.0 % 24.6  26.2  23.9   Platelets 150 - 400 K/uL 229  217  191      There is no height or weight on file to calculate BMI.  Orders:  No orders of the defined types were placed in this encounter.  No orders of the defined types were placed in this encounter.    Procedures: Large Joint Inj: R knee on 01/06/2022 2:46 PM Indications: pain and diagnostic evaluation Details: 25 G 1.5 in needle, anteromedial approach  Arthrogram: No  Medications: 80 mg methylPREDNISolone acetate 40 MG/ML; 4 mL lidocaine 1 %; 2 mL bupivacaine 0.25 % Outcome: tolerated well, no immediate complications  After  obtaining verbal consent right knee was sterilely prepped with Betadine and alcohol x 2.  4 cc of lidocaine plain was injected.  After adequate anesthetic attempted aspiration at the superior medial aspect of the knee did not produce any fluid.  80 cc of Depo-Medrol was injected Procedure, treatment alternatives, risks and benefits explained, specific risks discussed. Consent was given by the patient.    Clinical Data: No additional findings.  ROS:  All other systems negative, except as noted in the HPI. Review of Systems  Objective: Vital Signs: There were no vitals taken for this visit.  Specialty Comments:  No specialty comments available.  PMFS History: Patient Active Problem List   Diagnosis Date Noted  . Unilateral primary osteoarthritis, right knee 09/10/2021  . Pain in  left ankle and joints of left foot 07/05/2021  . Knee pain 08/10/2017  . Gastrointestinal hemorrhage with melena 01/07/2017  . Acute blood loss anemia 01/07/2017  . CAD (coronary artery disease) of artery bypass graft 10/11/2013  . Systemic hypertension 10/11/2013  . Hyperlipidemia 10/11/2013  . Hidradenitis suppurativa 10/06/2011  . Menopause 07/30/2010  . Wears glasses 07/30/2010  . Family history of breast cancer 07/30/2010   Past Medical History:  Diagnosis Date  . Abnormal Pap smear 06/19/2005   ASCUS   . Coronary artery disease   . GI (gastrointestinal bleed) 01/08/2017  . Headache(784.0)   . HGSIL (high grade squamous intraepithelial dysplasia) 2005  . Systemic hypertension 10/11/2013   PATIENT DENIES    Family History  Problem Relation Age of Onset  . Heart disease Father     Past Surgical History:  Procedure Laterality Date  . BLADDER SURGERY  2004   Ask patient to clarify. Not on history form.  Marland Kitchen CARDIAC SURGERY  2000   Bypass surgery.  . CYSTECTOMY  06/11/10   REMOVED ON BUTTOCKS   . ESOPHAGOGASTRODUODENOSCOPY (EGD) WITH PROPOFOL Left 01/08/2017   Procedure: ESOPHAGOGASTRODUODENOSCOPY (EGD) WITH PROPOFOL;  Surgeon: Kerin Salen, MD;  Location: [facility];  Service: Gastroenterology;  Laterality: Left;  . GALLBLADDER SURGERY  1981   Removal? Ask patient to clarify.  Marland Kitchen GASTRIC BYPASS  06/24/2012  . INGUINAL HIDRADENITIS EXCISION  2013  . NOVASURE ABLATION  07/18/2005  . TUBAL LIGATION     Social History   Occupational History  . Not on file  Tobacco Use  . Smoking status: Never  . Smokeless tobacco: Never  Vaping Use  . Vaping Use: Never used  Substance and Sexual Activity  . Alcohol use: Yes    Comment: two per month.  . Drug use: No  . Sexual activity: Yes    Birth control/protection: Surgical    Comment: BTL/ABLATION

## 2022-01-06 NOTE — Progress Notes (Signed)
Wants injection today in right knee

## 2022-01-14 ENCOUNTER — Telehealth: Payer: Self-pay

## 2022-01-14 NOTE — Telephone Encounter (Signed)
Called Sierra Lucas to discuss her allergy to CT contrast as she has an appt with [place] for a CT with contrast tomorrow. The Sierra Lucas reports she developed itching years ago after having a CT with contrast and has always taken 50 mg of benadryl 1 hr prior to studies with no issues. The patient reports she has never taken prednisone prior to her scan, only benadryl, with no breakthrough itching, hives, rash, etc... I discussed this with Dr. Karin Golden who is okay with the patient taking only the benadryl prior to her scan. Sierra Lucas verbalized understanding to take 50 mg of benadryl 01/15/22 at 9:20 AM.

## 2022-01-15 ENCOUNTER — Ambulatory Visit
Admission: RE | Admit: 2022-01-15 | Discharge: 2022-01-15 | Disposition: A | Discharge status: Home / Self Care | Payer: Commercial Managed Care - HMO | Source: Ambulatory Visit | Attending: Family Medicine | Admitting: Family Medicine

## 2022-01-15 DIAGNOSIS — R1030 Lower abdominal pain, unspecified: Secondary | ICD-10-CM

## 2022-01-15 MED ORDER — IOPAMIDOL (ISOVUE-300) INJECTION 61%
100.0000 mL | Freq: Once | INTRAVENOUS | Status: AC | PRN
  Administered 2022-01-15: 100 mL via INTRAVENOUS

## 2022-02-06 ENCOUNTER — Telehealth: Payer: Self-pay | Admitting: Interventional Cardiology

## 2022-02-06 NOTE — Telephone Encounter (Signed)
Returned call to patient.  Patient states she would like to see Dr. Katrinka Blazing prior to his retirement as she is due for her 1 year follow-up and has questions about how to best manage her cholesterol moving forward.  Scheduled appt with Dr. Katrinka Blazing for 02/14/2022 at 8:30am. Patient expressed appreciation for assistance.

## 2022-02-06 NOTE — Telephone Encounter (Signed)
Patient is calling to talk with Dr. Katrinka Blazing or nurse in regards to patient's medication. Please call back to discuss

## 2022-02-13 NOTE — Progress Notes (Signed)
Cardiology Office Note:    Date:  02/14/2022   ID:  Sierra Lucas, DOB 06/11/1955, MRN [id number]  PCP:  Cari Caraway, MD  Cardiologist:  Sinclair Grooms, MD   Referring MD: Cari Caraway, MD   Chief Complaint  Patient presents with   Coronary Artery Disease    History of Present Illness:    Sierra Lucas is a 67 y.o. female with a hx of coronary artery bypass grafting by Dr. Dahlia Byes 2020 with LIMA to LAD. Other medical history and includes primary hypertension, hyperlipidemia, and family history of aneurysm.    Sierra Lucas is doing well.  She denies angina.  No chest pain.  Denies orthopnea.  She is concerned that her blood pressures have been gradually creeping up over time.  She is compliant with her medication regimen.  She notes that she has had aches and pains for months.  She decided to discontinue statin therapy.  In the past she has been on rosuvastatin, atorvastatin, and most recently rosuvastatin.  She has been on relatively low-dose therapy and notes that all of rosuvastatin 10 mg/day she feels fine with no muscle aches.  She wants to explore other avenues to lower the cholesterol.  No recent data is available.  Past Medical History:  Diagnosis Date   Abnormal Pap smear 06/19/2005   ASCUS    Coronary artery disease    GI (gastrointestinal bleed) 01/08/2017   Headache(784.0)    HGSIL (high grade squamous intraepithelial dysplasia) 2005   Systemic hypertension 10/11/2013   PATIENT DENIES    Past Surgical History:  Procedure Laterality Date   BLADDER SURGERY  2004   Ask patient to clarify. Not on history form.   CARDIAC SURGERY  2000   Bypass surgery.   CYSTECTOMY  06/11/10   REMOVED ON BUTTOCKS    ESOPHAGOGASTRODUODENOSCOPY (EGD) WITH PROPOFOL Left 01/08/2017   Procedure: ESOPHAGOGASTRODUODENOSCOPY (EGD) WITH PROPOFOL;  Surgeon: Ronnette Juniper, MD;  Location: [facility];  Service: Gastroenterology;  Laterality: Left;   GALLBLADDER SURGERY  1981    Removal? Ask patient to clarify.   GASTRIC BYPASS  06/24/2012   INGUINAL HIDRADENITIS EXCISION  2013   NOVASURE ABLATION  07/18/2005   TUBAL LIGATION      Current Medications: Current Meds  Medication Sig   ALPRAZolam (XANAX) 0.5 MG tablet Take 0.5 mg by mouth as needed for anxiety.    aspirin 81 MG tablet Take 81 mg by mouth daily.    Biotin 1 MG CAPS Take 1 mg by mouth daily.    ezetimibe (ZETIA) 10 MG tablet Take 1 tablet (10 mg total) by mouth daily.   ferrous sulfate 325 (65 FE) MG tablet 1 twice a day   hydrochlorothiazide (MICROZIDE) 12.5 MG capsule Take 1 capsule (12.5 mg total) by mouth daily. *Please keep 01/23/16 appointment*   LORATADINE PO Take 10 mg by mouth daily at 6 (six) AM.   traZODone (DESYREL) 50 MG tablet Take 50 mg by mouth at bedtime.   venlafaxine XR (EFFEXOR-XR) 150 MG 24 hr capsule Take 150 mg by mouth daily.     Allergies:   Iohexol, Latex, and Sulfa antibiotics   Social History   Socioeconomic History   Marital status: Married    Spouse name: Not on file   Number of children: Not on file   Years of education: Not on file   Highest education level: Not on file  Occupational History   Not on file  Tobacco Use   Smoking  status: Never   Smokeless tobacco: Never  Vaping Use   Vaping Use: Never used  Substance and Sexual Activity   Alcohol use: Yes    Comment: two per month.   Drug use: No   Sexual activity: Yes    Birth control/protection: Surgical    Comment: BTL/ABLATION   Other Topics Concern   Not on file  Social History Narrative   Not on file   Social Determinants of Health   Financial Resource Strain: Not on file  Food Insecurity: Not on file  Transportation Needs: Not on file  Physical Activity: Not on file  Stress: Not on file  Social Connections: Not on file     Family History: The patient's family history includes Heart disease in her father.  ROS:   Please see the history of present illness.    No complaints all other  systems reviewed and are negative.  EKGs/Labs/Other Studies Reviewed:    The following studies were reviewed today: CHEST CTA with contrast 02/27/2021: IMPRESSION: 1. No evidence of thoracic aortic aneurysm. 2. Aortic and native coronary artery atherosclerotic calcifications. Evidence of prior CABG. 3. Small pulmonary nodules measuring 2 and 4 mm in the periphery of the right upper and lower lobes. No follow-up needed if patient is low-risk (and has no known or suspected primary neoplasm). Non-contrast chest CT can be considered in 12 months if patient is high-risk. This recommendation follows the consensus statement: Guidelines for Management of Incidental Pulmonary Nodules Detected on CT Images:From the Fleischner Society 2017; published online before print (10.1148/radiol.1191478295). 4. Surgical changes of gastric bypass procedure.   Aortic Atherosclerosis (ICD10-I70.0).  ABDOMINAL CT SCAN 01/15/2022: IMPRESSION: 1. No acute abdominal/pelvic findings, mass lesions or adenopathy. 2. Surgical changes from gastric bypass surgery but no complicating features are identified. 3. Status post cholecystectomy. No biliary dilatation.  EKG:  EKG normal sinus rhythm with T wave abnormality V1 through V2.  This EKG is unchanged from the tracing performed in December 2022.  Recent Labs: 02/20/2021: BUN 16; Creatinine, Ser 0.73; Potassium 4.1; Sodium 135  Recent Lipid Panel    Component Value Date/Time   CHOL 136 10/26/2014 0805   TRIG 164.0 (H) 10/26/2014 0805   HDL 59.30 10/26/2014 0805   CHOLHDL 2 10/26/2014 0805   VLDL 32.8 10/26/2014 0805   LDLCALC 43 10/26/2014 0805    Physical Exam:    VS:  BP 138/88   Pulse 76   Ht 5\' 3"  (1.6 m)   Wt 202 lb 12.8 oz (92 kg)   SpO2 96%   BMI 35.92 kg/m     Wt Readings from Last 3 Encounters:  02/14/22 202 lb 12.8 oz (92 kg)  10/28/21 197 lb (89.4 kg)  01/28/21 209 lb 3.2 oz (94.9 kg)     GEN: Overweight. No acute distress HEENT:  Normal NECK: No JVD. LYMPHATICS: No lymphadenopathy CARDIAC: No murmur. RRR no gallop, or edema. VASCULAR:  Normal Pulses. No bruits. RESPIRATORY:  Clear to auscultation without rales, wheezing or rhonchi  ABDOMEN: Soft, non-tender, non-distended, No pulsatile mass, MUSCULOSKELETAL: No deformity  SKIN: Warm and dry NEUROLOGIC:  Alert and oriented x 3 PSYCHIATRIC:  Normal affect   ASSESSMENT:    1. Coronary artery disease involving coronary bypass graft of native heart with unstable angina pectoris ([place])   2. Other hyperlipidemia   3. Primary hypertension   4. Family history of aneurysm    PLAN:    In order of problems listed above:  Secondary prevention discussed.  Both  blood pressure and lipids are concerned currently.  She is off rosuvastatin.  Decided today to begin ezetimibe 10 mg/day, 1 month follow-up with a lipid panel, and have an appointment in the lipid clinic at [facility] for further guidance and medication adjustment as needed to keep LDL less than 55.  Perhaps bempedoic acid can be added at depending upon the LDL level. See above Target blood pressure 130/80 mmHg.  She is creeping up a bit.  We discussed diet and weight loss along with exercise.  If she begins to run consistently above 140/90 would recommend adding losartan 50 mg and decreasing the hydrochlorothiazide to 12.5 mg/day.   Overall education and awareness concerning primary/secondary risk prevention was discussed in detail: LDL less than 70, hemoglobin A1c less than 7, blood pressure target less than 130/80 mmHg, >150 minutes of moderate aerobic activity per week, avoidance of smoking, weight control (via diet and exercise), and continued surveillance/management of/for obstructive sleep apnea.  Dr. Johney Frame in 9 to 12 months.   Medication Adjustments/Labs and Tests Ordered: Current medicines are reviewed at length with the patient today.  Concerns regarding medicines are outlined above.  Orders Placed  This Encounter  Procedures   Lipid panel   Hepatic function panel   AMB Referral to [facility]   EKG 12-Lead   Meds ordered this encounter  Medications   ezetimibe (ZETIA) 10 MG tablet    Sig: Take 1 tablet (10 mg total) by mouth daily.    Dispense:  90 tablet    Refill:  3    Patient Instructions  Medication Instructions:  Your physician has recommended you make the following change in your medication:   1) START ezetimibe (Zetia) 10mg  daily  *If you need a refill on your cardiac medications before your next appointment, please call your pharmacy*  Lab Work: In 1 month (preferably same day as appt with Lipid Clinic): fasting Lipid and LFTs.  If you have labs (blood work) drawn today and your tests are completely normal, you will receive your results only by: [place] (if you have MyChart) OR A paper copy in the mail If you have any lab test that is abnormal or we need to change your treatment, we will call you to review the results.  Testing/Procedures: NONE  Follow-Up: At [facility], you and your health needs are our priority.  As part of our continuing mission to provide you with exceptional heart care, we have created designated Provider Care Teams.  These Care Teams include your primary Cardiologist (physician) and Advanced Practice Providers (APPs -  Physician Assistants and Nurse Practitioners) who all work together to provide you with the care you need, when you need it.  Your next appointment:   1 month with Lipid Clinic 1 year(s)  The format for your next appointment:   In Person  Provider:   Gwyndolyn Kaufman, MD  Other Instructions Your provider has referred you to our Lipid Clinic at our [facility] office, you will meet with our pharmacist to discuss cholesterol management options.   Important Information About Sugar         Signed, Sinclair Grooms, MD  02/14/2022 9:01 AM    [place]

## 2022-02-14 ENCOUNTER — Encounter: Payer: Self-pay | Admitting: Interventional Cardiology

## 2022-02-14 ENCOUNTER — Ambulatory Visit: Payer: Commercial Managed Care - HMO | Attending: Interventional Cardiology | Admitting: Interventional Cardiology

## 2022-02-14 VITALS — BP 138/88 | HR 76 | Ht 63.0 in | Wt 202.8 lb

## 2022-02-14 DIAGNOSIS — Z8249 Family history of ischemic heart disease and other diseases of the circulatory system: Secondary | ICD-10-CM

## 2022-02-14 DIAGNOSIS — I10 Essential (primary) hypertension: Secondary | ICD-10-CM

## 2022-02-14 DIAGNOSIS — I25700 Atherosclerosis of coronary artery bypass graft(s), unspecified, with unstable angina pectoris: Secondary | ICD-10-CM

## 2022-02-14 DIAGNOSIS — E7849 Other hyperlipidemia: Secondary | ICD-10-CM

## 2022-02-14 MED ORDER — EZETIMIBE 10 MG PO TABS: 10.0000 mg | ORAL_TABLET | Freq: Every day | ORAL | 3 refills | Status: DC

## 2022-02-14 NOTE — Patient Instructions (Signed)
Medication Instructions:  Your physician has recommended you make the following change in your medication:   1) START ezetimibe (Zetia) 10mg  daily  *If you need a refill on your cardiac medications before your next appointment, please call your pharmacy*  Lab Work: In 1 month (preferably same day as appt with Lipid Clinic): fasting Lipid and LFTs.  If you have labs (blood work) drawn today and your tests are completely normal, you will receive your results only by: [place] (if you have MyChart) OR A paper copy in the mail If you have any lab test that is abnormal or we need to change your treatment, we will call you to review the results.  Testing/Procedures: NONE  Follow-Up: At [facility], you and your health needs are our priority.  As part of our continuing mission to provide you with exceptional heart care, we have created designated Provider Care Teams.  These Care Teams include your primary Cardiologist (physician) and Advanced Practice Providers (APPs -  Physician Assistants and Nurse Practitioners) who all work together to provide you with the care you need, when you need it.  Your next appointment:   1 month with Lipid Clinic 1 year(s)  The format for your next appointment:   In Person  Provider:   Gwyndolyn Kaufman, MD  Other Instructions Your provider has referred you to our Lipid Clinic at our [facility] office, you will meet with our pharmacist to discuss cholesterol management options.   Important Information About Sugar

## 2022-02-26 ENCOUNTER — Other Ambulatory Visit: Payer: Self-pay | Admitting: Family Medicine

## 2022-02-26 DIAGNOSIS — Z1231 Encounter for screening mammogram for malignant neoplasm of breast: Secondary | ICD-10-CM

## 2022-04-01 ENCOUNTER — Ambulatory Visit: Payer: Commercial Managed Care - HMO | Attending: Cardiovascular Disease | Admitting: Pharmacist

## 2022-04-01 ENCOUNTER — Ambulatory Visit: Payer: Commercial Managed Care - HMO

## 2022-04-01 DIAGNOSIS — E7849 Other hyperlipidemia: Secondary | ICD-10-CM

## 2022-04-01 DIAGNOSIS — I10 Essential (primary) hypertension: Secondary | ICD-10-CM

## 2022-04-01 LAB — LIPID PANEL
Chol/HDL Ratio: 4 ratio (ref 0.0–4.4)
Cholesterol, Total: 201 mg/dL — ABNORMAL HIGH (ref 100–199)
HDL: 50 mg/dL (ref >39)
LDL Chol Calc (NIH): 113 mg/dL — ABNORMAL HIGH (ref 0–99)
Triglycerides: 217 mg/dL — ABNORMAL HIGH (ref 0–149)
VLDL Cholesterol Cal: 38 mg/dL (ref 5–40)

## 2022-04-01 LAB — HEPATIC FUNCTION PANEL
ALT: 22 IU/L (ref 0–32)
AST: 19 IU/L (ref 0–40)
Albumin: 4.1 g/dL (ref 3.9–4.9)
Alkaline Phosphatase: 97 IU/L (ref 44–121)
Bilirubin Total: 0.3 mg/dL (ref 0.0–1.2)
Bilirubin, Direct: <0.1 mg/dL (ref 0.00–0.40)
Total Protein: 6.6 g/dL (ref 6.0–8.5)

## 2022-04-01 NOTE — Patient Instructions (Signed)
Summary of today's discussion  1.Continue ezetimibe. Add rosuvastatin 81m three times a week  2.Continue HCTZ 288mdaily  3.Check blood pressure daily  4.Decrease sweet tea and increase water intake  5.Increase bike riding by 1 day/week   Your blood pressure goal is <130/80  To check your pressure at home you will need to:  1. Sit up in a chair, with feet flat on the floor and back supported. Do not cross your ankles or legs. 2. Rest your left arm so that the cuff is about heart level. If the cuff goes on your upper arm,  then just relax the arm on the table, arm of the chair or your lap. If you have a wrist cuff, we  suggest relaxing your wrist against your chest (think of it as Pledging the Flag with the  wrong arm).  3. Place the cuff snugly around your arm, about 1 inch above the crook of your elbow. The  cords should be inside the groove of your elbow.  4. Sit quietly, with the cuff in place, for about 5 minutes. After that 5 minutes press the power  button to start a reading. 5. Do not talk or move while the reading is taking place.  6. Record your readings on a sheet of paper. Although most cuffs have a memory, it is often  easier to see a pattern developing when the numbers are all in front of you.  7. You can repeat the reading after 1-3 minutes if it is recommended  Make sure your bladder is empty and you have not had caffeine or tobacco within the last 30 min  Always bring your blood pressure log with you to your appointments. If you have not brought your monitor in to be double checked for accuracy, please bring it to your next appointment.  You can find a list of validated (accurate) blood pressure cuffs at vaPopPath.it Important lifestyle changes to control high blood pressure  Intervention  Effect on the BP  Lose extra pounds and watch your waistline Weight loss is one of the most effective lifestyle changes for controlling blood pressure. If you're overweight  or obese, losing even a small amount of weight can help reduce blood pressure. Blood pressure might go down by about 1 millimeter of mercury (mm Hg) with each kilogram (about 2.2 pounds) of weight lost.  Exercise regularly As a general goal, aim for at least 30 minutes of moderate physical activity every day. Regular physical activity can lower high blood pressure by about 5 to 8 mm Hg.  Eat a healthy diet Eating a diet rich in whole grains, fruits, vegetables, and low-fat dairy products and low in saturated fat and cholesterol. A healthy diet can lower high blood pressure by up to 11 mm Hg.  Reduce salt (sodium) in your diet Even a small reduction of sodium in the diet can improve heart health and reduce high blood pressure by about 5 to 6 mm Hg.  Limit alcohol One drink equals 12 ounces of beer, 5 ounces of wine, or 1.5 ounces of 80-proof liquor.  Limiting alcohol to less than one drink a day for women or two drinks a day for men can help lower blood pressure by about 4 mm Hg.   Please call me at [phone] any questions.

## 2022-04-01 NOTE — Assessment & Plan Note (Signed)
Assessment: Home blood pressures are boarderline Only 5 available Patient reports BP at Dr. Gabriel Carina always higher. Does not like the squeezing of cuff Is active, really enjoys walking but cannot not due to knee Rides bike for 60 min 3-4 times a week Doesn't like resistance training Admits diet could use some work  Plan: I have asked patient to start checking BP daily She will bring in home BP cuff and readings to next visit in 1 month If BP is high and home BP cuff accurate will add additional therapy

## 2022-04-01 NOTE — Assessment & Plan Note (Addendum)
Assessment: LDL-C was at goal on rosuvastatin 59m daily, but patient developed bilateral muscle aches that improved off treatment She is tolerating ezetimibe well Discussed medication options and LDL-C reduction needed Reviewed options for lowering LDL cholesterol, including low dose statin, PCSKi-9 inhibitors, bempedoic acid and inclisiran.  Discussed mechanisms of action, dosing, side effects and potential decreases in LDL cholesterol.  Also reviewed cost information and potential options for patient assistance.  Patient currently on commercial insurance (high deductible, but thinks its just on medical) but will be on medicare in Sept  Plan: Start rosuvastatin 134mthree times a week Continue ezetimibe 1076maily Repeat labs in April If LDL-C above goal in April, patient willing to consider Repatha or Nexlizet.

## 2022-04-01 NOTE — Progress Notes (Signed)
Patient ID: Sierra Lucas                 DOB: Aug 25, 1955                    MRN: [id number]      HPI: Sierra Lucas is a 67 y.o. female patient referred to lipid clinic by Dr. Tamala Julian. PMH is significant for coronary artery bypass grafting by Dr. Dahlia Byes 2020 with LIMA to LAD,  hypertension, hyperlipidemia, and family history of aneurysm.    Patient previously on rosuvastatin 10 mg daily since her CABG in 2020.  Was briefly on Zocor but this caused immediate muscle pains.  She has been noticing recently muscle pains similar to the flu and when she stopped her rosuvastatin her muscle pains went away.  When she saw Dr. Tamala Julian in January she was started on ezetimibe 10 mg daily and sent to the lipid clinic.  Blood pressure was also borderline at last visit and pharmacy was asked to follow-up on this.  Patient presents today to clinic.  States she tolerates ezetimibe well she has checked her blood pressure 5 times at home.  Readings are borderline within goal. 130/80, 123/83, 131/73, 129/73.  She exercises 4-5 times a week, riding her stationary bike for 60 minutes.  States she really likes to walk but needs a knee replacement and therefore cannot walk as much as she would like.  She currently has Pharmacist, community but in September will have Medicare.  She does have a high deductible but does not think this applies to her prescriptions.  Admits that she drinks a large sweet tea every day, is working on drinking more water.  Reviewed options for lowering LDL cholesterol, including PCSK-9 inhibitors, bempedoic acid and inclisiran.  Discussed mechanisms of action, dosing, side effects and potential decreases in LDL cholesterol.  Also reviewed cost information and potential options for patient assistance.   Current Lipid Medications: ezetimibe 45m daily Current BP Medications: HCTZ 260mdaily Intolerances: rosuvastatin 1061mmuscle pains), simvastatin (muscle pain) Risk Factors: premature  CAD LDL-C goal: <55 ApoB goal: <80  Diet: breakfast: oatmeal or chicken mini without bread and hash browns,fruits, grits Lunch: grilled cheese, take out, - gets really hungry and then snacks on a lof chips, cookie Dinner: soup once a week or meat and veggies the other days -most of the time cooks at home, pasta once a week Drinks: 1 large sweet tea, 1 coffee per day (1/2 and 1/2), diet cranberries juice watered down Tries to plan meals  Home BP Readings: 130/80, 123/83, 131/73, 129/73  Exercise: rides stationary bike (3-4 days a week for 60 min), can't walk bc she needs a knee replacement  Family History: The patient's family history includes Heart disease in her father.   Social History: no tobacco, 1 drink once every few weeks  Labs: Lipid Panel  07/19/20: TG 190, HDL 58 LDL-C 55, non HDL 86      Component Value Date/Time   CHOL 136 10/26/2014 0805   TRIG 164.0 (H) 10/26/2014 0805   HDL 59.30 10/26/2014 0805   CHOLHDL 2 10/26/2014 0805   VLDL 32.8 10/26/2014 0805   LDLCALC 43 10/26/2014 0805    Past Medical History:  Diagnosis Date   Abnormal Pap smear 06/19/2005   ASCUS    Coronary artery disease    GI (gastrointestinal bleed) 01/08/2017   Headache(784.0)    HGSIL (high grade squamous intraepithelial dysplasia) 2005   Systemic hypertension 10/11/2013  PATIENT DENIES    Current Outpatient Medications on File Prior to Visit  Medication Sig Dispense Refill   ALPRAZolam (XANAX) 0.5 MG tablet Take 0.5 mg by mouth as needed for anxiety.      aspirin 81 MG tablet Take 81 mg by mouth daily.      Biotin 1 MG CAPS Take 1 mg by mouth daily.      cetirizine (ZYRTEC) 10 MG tablet Take 10 mg by mouth daily.     ezetimibe (ZETIA) 10 MG tablet Take 1 tablet (10 mg total) by mouth daily. 90 tablet 3   hydrochlorothiazide (HYDRODIURIL) 25 MG tablet Take 25 mg by mouth daily.     traZODone (DESYREL) 50 MG tablet Take 50 mg by mouth at bedtime as needed.     venlafaxine XR  (EFFEXOR-XR) 150 MG 24 hr capsule Take 150 mg by mouth daily.     ferrous sulfate 325 (65 FE) MG tablet 1 twice a day 60 tablet 3   pantoprazole (PROTONIX) 40 MG tablet Take 1 tablet (40 mg total) by mouth 2 (two) times daily before a meal. 60 tablet 3   No current facility-administered medications on file prior to visit.    Allergies  Allergen Reactions   Iohexol Itching     pt had prior contrast yrs ago W/ itching.   50 mg benadryl po 15 mins before scan.  she still had itching of mouth, lips, throat and breast. needs po benadryl 1 hr prior per dr. Reesa Chew, Onset Date: [id number]    Latex     Ask patient to clarify type/severity/reaction. Not indicated on history form dated 06/05/10.   Sulfa Antibiotics     Ask patient to clarify type/severity/reaction. Not indicated on medical history form dated 06/05/10.    Assessment/Plan:  1. Hyperlipidemia -  Systemic hypertension Assessment: Home blood pressures are boarderline Only 5 available Patient reports BP at Dr. Gabriel Carina always higher. Does not like the squeezing of cuff Is active, really enjoys walking but cannot not due to knee Rides bike for 60 min 3-4 times a week Doesn't like resistance training Admits diet could use some work  Plan: I have asked patient to start checking BP daily She will bring in home BP cuff and readings to next visit in 1 month If BP is high and home BP cuff accurate will add additional therapy   Hyperlipidemia Assessment: LDL-C was at goal on rosuvastatin 33m daily, but patient developed bilateral muscle aches that improved off treatment She is tolerating ezetimibe well Discussed medication options and LDL-C reduction needed Reviewed options for lowering LDL cholesterol, including low dose statin, PCSKi-9 inhibitors, bempedoic acid and inclisiran.  Discussed mechanisms of action, dosing, side effects and potential decreases in LDL cholesterol.  Also reviewed cost information and potential options for  patient assistance.  Patient currently on commercial insurance (high deductible, but thinks its just on medical) but will be on medicare in Sept  Plan: Start rosuvastatin 135mthree times a week Continue ezetimibe 1015maily Repeat labs in April If LDL-C above goal in April, patient willing to consider Repatha or Nexlizet.    Thank you,  MelRamond Dialharm.D, BCPS, CPP [facility] [id number]: [phone]: [phone]

## 2022-04-15 ENCOUNTER — Ambulatory Visit
Admission: RE | Admit: 2022-04-15 | Discharge: 2022-04-15 | Disposition: A | Discharge status: Home / Self Care | Payer: Commercial Managed Care - HMO | Source: Ambulatory Visit | Attending: Family Medicine | Admitting: Family Medicine

## 2022-04-15 DIAGNOSIS — Z1231 Encounter for screening mammogram for malignant neoplasm of breast: Secondary | ICD-10-CM

## 2022-04-21 ENCOUNTER — Telehealth: Payer: Self-pay | Admitting: Pharmacist

## 2022-04-21 DIAGNOSIS — E7849 Other hyperlipidemia: Secondary | ICD-10-CM

## 2022-04-21 DIAGNOSIS — I10 Essential (primary) hypertension: Secondary | ICD-10-CM

## 2022-04-21 NOTE — Telephone Encounter (Signed)
Pt called clinic, reports not tolerating rosuvastatin. Clarified med since I don't see this on her med list, she reports she took '10mg'$  dose 3x/week after her last visit but experienced muscle aches with a week or two. Allergy list updated. Still taking her ezetimibe. LDL was 113 on labs from 2/20, her goal is < 55 due to premature CAD.   Discussed that PCSK9i would be most effective at bringing her LDL to goal, she is interested in trying Repatha. She has a latex allergy - clarified this is itching. She is worried about the cost of med in general, Praluent copay card is $50/month vs Repatha which is $5/month. She would like to try Repatha still, doesn't think it will be a problem. Will submit PA request, key [id number].

## 2022-04-22 NOTE — Telephone Encounter (Signed)
PA still pending.  

## 2022-04-23 ENCOUNTER — Encounter: Payer: Self-pay | Admitting: Physician Assistant

## 2022-04-23 ENCOUNTER — Ambulatory Visit (INDEPENDENT_AMBULATORY_CARE_PROVIDER_SITE_OTHER): Payer: Commercial Managed Care - HMO | Admitting: Physician Assistant

## 2022-04-23 ENCOUNTER — Encounter: Payer: Self-pay | Admitting: Pharmacist

## 2022-04-23 DIAGNOSIS — M1711 Unilateral primary osteoarthritis, right knee: Secondary | ICD-10-CM

## 2022-04-23 MED ORDER — LIDOCAINE HCL 1 % IJ SOLN
2.0000 mL | INTRAMUSCULAR | Status: AC | PRN
  Administered 2022-04-23: 2 mL

## 2022-04-23 MED ORDER — REPATHA SURECLICK 140 MG/ML ~~LOC~~ SOAJ: 1.0000 | SUBCUTANEOUS | 3 refills | Status: DC

## 2022-04-23 MED ORDER — BUPIVACAINE HCL 0.25 % IJ SOLN
2.0000 mL | INTRAMUSCULAR | Status: AC | PRN
  Administered 2022-04-23: 2 mL via INTRA_ARTICULAR

## 2022-04-23 MED ORDER — METHYLPREDNISOLONE ACETATE 40 MG/ML IJ SUSP
80.0000 mg | INTRAMUSCULAR | Status: AC | PRN
  Administered 2022-04-23: 80 mg via INTRA_ARTICULAR

## 2022-04-23 NOTE — Progress Notes (Signed)
Office Visit Note   Patient: Sierra Lucas           Date of Birth: [birth year]           MRN: [id number] Visit Date: 04/23/2022              Requested by: Cari Caraway, [place],  [place] [address] PCP: Cari Caraway, MD  Chief Complaint  Patient presents with  . Right Knee - Pain, Follow-up      HPI: Patient is a pleasant 67 year old woman well-known to me.  She has a history of right knee arthritis.  She has been treated with aspiration and injection in the past.  She did well with a steroid injection however her pain returned in December after her dog caused her to twist her knee and fall to the ground.  She is requesting a steroid injection today  Assessment & Plan: Visit Diagnoses: Osteoarthritis right knee  Plan: Will go forward with an injection today.  She does have known valgus arthritis of her right knee.  She would like to meet with Dr. Ninfa Linden to discuss knee replacement surgery she can do this at her convenience we have given her his card she will make an appointment  Follow-Up Instructions: No follow-ups on file.   Ortho Exam  Patient is alert, oriented, no adenopathy, well-dressed, normal affect, normal respiratory effort. Right knee no effusion no redness no warmth.  She is neurovascular intact compartments are soft and nontender  Imaging: No results found. No images are attached to the encounter.  Labs: Lab Results  Component Value Date   REPTSTATUS 06/13/2010 FINAL 06/11/2010   REPTSTATUS 06/16/2010 FINAL 06/11/2010   GRAMSTAIN  06/11/2010    RARE WBC PRESENT, PREDOMINANTLY MONONUCLEAR NO SQUAMOUS EPITHELIAL CELLS SEEN NO ORGANISMS SEEN   GRAMSTAIN  06/11/2010    RARE WBC PRESENT, PREDOMINANTLY MONONUCLEAR NO SQUAMOUS EPITHELIAL CELLS SEEN NO ORGANISMS SEEN   CULT NO GROWTH 2 DAYS 06/11/2010   CULT NO ANAEROBES ISOLATED 06/11/2010     Lab Results  Component Value Date   ALBUMIN 4.1 04/01/2022   ALBUMIN 3.2 (L)  01/07/2017   ALBUMIN 3.5 04/28/2013    Lab Results  Component Value Date   MG 2.3 01/09/2017   No results found for: "VD25OH"  No results found for: "PREALBUMIN"    Latest Ref Rng & Units 01/09/2017   12:00 AM 01/08/2017    6:03 PM 01/08/2017    7:45 AM  CBC EXTENDED  WBC 4.0 - 10.5 K/uL 10.7  11.9  8.8   RBC 3.87 - 5.11 MIL/uL 2.79  2.93  2.70   Hemoglobin 12.0 - 15.0 g/dL 8.3  8.6  8.0   HCT 36.0 - 46.0 % 24.6  26.2  23.9   Platelets 150 - 400 K/uL 229  217  191      There is no height or weight on file to calculate BMI.  Orders:  No orders of the defined types were placed in this encounter.  No orders of the defined types were placed in this encounter.    Procedures: Large Joint Inj: R knee on 04/23/2022 2:07 PM Indications: pain and diagnostic evaluation Details: 25 G 1.5 in needle, anteromedial approach  Arthrogram: No  Medications: 80 mg methylPREDNISolone acetate 40 MG/ML; 2 mL lidocaine 1 %; 2 mL bupivacaine 0.25 % Outcome: tolerated well, no immediate complications Procedure, treatment alternatives, risks and benefits explained, specific risks discussed. Consent was given by the patient.  Clinical Data: No additional findings.  ROS:  All other systems negative, except as noted in the HPI. Review of Systems  Objective: Vital Signs: There were no vitals taken for this visit.  Specialty Comments:  No specialty comments available.  PMFS History: Patient Active Problem List   Diagnosis Date Noted  . Unilateral primary osteoarthritis, right knee 09/10/2021  . Pain in left ankle and joints of left foot 07/05/2021  . Knee pain 08/10/2017  . Gastrointestinal hemorrhage with melena 01/07/2017  . Acute blood loss anemia 01/07/2017  . CAD (coronary artery disease) of artery bypass graft 10/11/2013  . Systemic hypertension 10/11/2013  . Hyperlipidemia 10/11/2013  . Hidradenitis suppurativa 10/06/2011  . Menopause 07/30/2010  . Wears glasses  07/30/2010  . Family history of breast cancer 07/30/2010   Past Medical History:  Diagnosis Date  . Abnormal Pap smear 06/19/2005   ASCUS   . Coronary artery disease   . GI (gastrointestinal bleed) 01/08/2017  . Headache(784.0)   . HGSIL (high grade squamous intraepithelial dysplasia) 2005  . Systemic hypertension 10/11/2013   PATIENT DENIES    Family History  Problem Relation Age of Onset  . Heart disease Father   . Breast cancer Maternal Grandmother     Past Surgical History:  Procedure Laterality Date  . BLADDER SURGERY  02/10/2002   Ask patient to clarify. Not on history form.  Marland Kitchen CARDIAC SURGERY  02/10/1998   Bypass surgery.  . CYSTECTOMY  06/11/2010   REMOVED ON BUTTOCKS   . ESOPHAGOGASTRODUODENOSCOPY (EGD) WITH PROPOFOL Left 01/08/2017   Procedure: ESOPHAGOGASTRODUODENOSCOPY (EGD) WITH PROPOFOL;  Surgeon: Ronnette Juniper, MD;  Location: [facility];  Service: Gastroenterology;  Laterality: Left;  . GALLBLADDER SURGERY  02/11/1979   Removal? Ask patient to clarify.  Marland Kitchen GASTRIC BYPASS  06/24/2012  . INGUINAL HIDRADENITIS EXCISION  02/11/2011  . NOVASURE ABLATION  07/18/2005  . REDUCTION MAMMAPLASTY Bilateral   . TUBAL LIGATION     Social History   Occupational History  . Not on file  Tobacco Use  . Smoking status: Never  . Smokeless tobacco: Never  Vaping Use  . Vaping Use: Never used  Substance and Sexual Activity  . Alcohol use: Yes    Comment: two per month.  . Drug use: No  . Sexual activity: Yes    Birth control/protection: Surgical    Comment: BTL/ABLATION

## 2022-04-23 NOTE — Telephone Encounter (Signed)
PA approved through 04/23/23, pt is aware. Rx sent to pharmacy, lab orders placed, copay card activated and sent to pt via mychart message, and reviewed injection technique with pt over the phone.

## 2022-05-07 ENCOUNTER — Ambulatory Visit: Payer: Commercial Managed Care - HMO

## 2022-05-19 ENCOUNTER — Ambulatory Visit: Payer: Commercial Managed Care - HMO | Admitting: Physician Assistant

## 2022-05-20 ENCOUNTER — Other Ambulatory Visit (INDEPENDENT_AMBULATORY_CARE_PROVIDER_SITE_OTHER): Payer: Commercial Managed Care - HMO

## 2022-05-20 ENCOUNTER — Encounter: Payer: Self-pay | Admitting: Physician Assistant

## 2022-05-20 ENCOUNTER — Ambulatory Visit (INDEPENDENT_AMBULATORY_CARE_PROVIDER_SITE_OTHER): Payer: Commercial Managed Care - HMO | Admitting: Physician Assistant

## 2022-05-20 DIAGNOSIS — M1711 Unilateral primary osteoarthritis, right knee: Secondary | ICD-10-CM | POA: Diagnosis not present

## 2022-05-20 MED ORDER — LIDOCAINE HCL 1 % IJ SOLN
5.0000 mL | INTRAMUSCULAR | Status: AC | PRN
  Administered 2022-05-20: 5 mL

## 2022-05-20 MED ORDER — METHYLPREDNISOLONE ACETATE 40 MG/ML IJ SUSP
40.0000 mg | INTRAMUSCULAR | Status: AC | PRN
  Administered 2022-05-20: 40 mg via INTRA_ARTICULAR

## 2022-05-20 MED ORDER — TRAMADOL HCL 50 MG PO TABS: 50.0000 mg | ORAL_TABLET | Freq: Four times a day (QID) | ORAL | 0 refills | Status: DC | PRN
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
## 2022-05-21 ENCOUNTER — Telehealth: Payer: Self-pay | Admitting: Physician Assistant

## 2022-05-21 NOTE — Telephone Encounter (Signed)
Called and advised pt it was sent in yesterday

## 2022-05-21 NOTE — Telephone Encounter (Signed)
Patient states she was waiting ion a rx of Tramadol but never got it. Please advise. [facility] [address]

## 2022-05-27 ENCOUNTER — Ambulatory Visit (INDEPENDENT_AMBULATORY_CARE_PROVIDER_SITE_OTHER): Payer: Commercial Managed Care - HMO | Admitting: Physician Assistant

## 2022-05-27 ENCOUNTER — Encounter: Payer: Self-pay | Admitting: Physician Assistant

## 2022-05-27 DIAGNOSIS — M1711 Unilateral primary osteoarthritis, right knee: Secondary | ICD-10-CM | POA: Diagnosis not present

## 2022-05-27 NOTE — Progress Notes (Signed)
Office Visit Note   Patient: Sierra Lucas           Date of Birth: [birth year]           MRN: [id number] Visit Date: 05/27/2022              Requested by: Gweneth Dimitri, MD [address] [place],  [place] [address] PCP: Gweneth Dimitri, MD  Chief Complaint  Patient presents with   Right Knee - Pain, Follow-up      HPI: Sierra Lucas comes in today for recheck of her right knee.  She did get an injection last week.  She does still have some pain but has had significant improvement.  She is planning on going forward with knee replacement later this year.  She rates her pain as mild  Assessment & Plan: Visit Diagnoses: Osteoarthritis right knee  Plan: She is planning a trip in a couple weeks oversee.  She asked if she could have 1 more injection and then she will stop injections until her knee replacement.  I be fine with this she will follow-up at that time she is doing better she can defer the injection  Follow-Up Instructions: No follow-ups on file.   Ortho Exam  Patient is alert, oriented, no adenopathy, well-dressed, normal affect, normal respiratory effort. Right knee no redness no effusion she does have decrease in swelling from last time she has grinding with range of motion most of her pain is over the medial joint line she is neurovascular intact  Imaging: No results found. No images are attached to the encounter.  Labs: Lab Results  Component Value Date   REPTSTATUS 06/13/2010 FINAL 06/11/2010   REPTSTATUS 06/16/2010 FINAL 06/11/2010   GRAMSTAIN  06/11/2010    RARE WBC PRESENT, PREDOMINANTLY MONONUCLEAR NO SQUAMOUS EPITHELIAL CELLS SEEN NO ORGANISMS SEEN   GRAMSTAIN  06/11/2010    RARE WBC PRESENT, PREDOMINANTLY MONONUCLEAR NO SQUAMOUS EPITHELIAL CELLS SEEN NO ORGANISMS SEEN   CULT NO GROWTH 2 DAYS 06/11/2010   CULT NO ANAEROBES ISOLATED 06/11/2010     Lab Results  Component Value Date   ALBUMIN 4.1 04/01/2022   ALBUMIN 3.2 (L) 01/07/2017   ALBUMIN  3.5 04/28/2013    Lab Results  Component Value Date   MG 2.3 01/09/2017   No results found for: "VD25OH"  No results found for: "PREALBUMIN"    Latest Ref Rng & Units 01/09/2017   12:00 AM 01/08/2017    6:03 PM 01/08/2017    7:45 AM  CBC EXTENDED  WBC 4.0 - 10.5 K/uL 10.7  11.9  8.8   RBC 3.87 - 5.11 MIL/uL 2.79  2.93  2.70   Hemoglobin 12.0 - 15.0 g/dL 8.3  8.6  8.0   HCT 37.1 - 46.0 % 24.6  26.2  23.9   Platelets 150 - 400 K/uL 229  217  191      There is no height or weight on file to calculate BMI.  Orders:  No orders of the defined types were placed in this encounter.  No orders of the defined types were placed in this encounter.    Procedures: No procedures performed  Clinical Data: No additional findings.  ROS:  All other systems negative, except as noted in the HPI. Review of Systems  Objective: Vital Signs: There were no vitals taken for this visit.  Specialty Comments:  No specialty comments available.  PMFS History: Patient Active Problem List   Diagnosis Date Noted   Unilateral primary osteoarthritis, right  knee 09/10/2021   Pain in left ankle and joints of left foot 07/05/2021   Knee pain 08/10/2017   Gastrointestinal hemorrhage with melena 01/07/2017   Acute blood loss anemia 01/07/2017   CAD (coronary artery disease) of artery bypass graft 10/11/2013   Systemic hypertension 10/11/2013   Hyperlipidemia 10/11/2013   Hidradenitis suppurativa 10/06/2011   Menopause 07/30/2010   Wears glasses 07/30/2010   Family history of breast cancer 07/30/2010   Past Medical History:  Diagnosis Date   Abnormal Pap smear 06/19/2005   ASCUS    Coronary artery disease    GI (gastrointestinal bleed) 01/08/2017   Headache(784.0)    HGSIL (high grade squamous intraepithelial dysplasia) 2005   Systemic hypertension 10/11/2013   PATIENT DENIES    Family History  Problem Relation Age of Onset   Heart disease Father    Breast cancer Maternal Grandmother      Past Surgical History:  Procedure Laterality Date   BLADDER SURGERY  02/10/2002   Ask patient to clarify. Not on history form.   CARDIAC SURGERY  02/10/1998   Bypass surgery.   CYSTECTOMY  06/11/2010   REMOVED ON BUTTOCKS    ESOPHAGOGASTRODUODENOSCOPY (EGD) WITH PROPOFOL Left 01/08/2017   Procedure: ESOPHAGOGASTRODUODENOSCOPY (EGD) WITH PROPOFOL;  Surgeon: Kerin Salen, MD;  Location: [facility] ENDOSCOPY;  Service: Gastroenterology;  Laterality: Left;   GALLBLADDER SURGERY  02/11/1979   Removal? Ask patient to clarify.   GASTRIC BYPASS  06/24/2012   INGUINAL HIDRADENITIS EXCISION  02/11/2011   NOVASURE ABLATION  07/18/2005   REDUCTION MAMMAPLASTY Bilateral    TUBAL LIGATION     Social History   Occupational History   Not on file  Tobacco Use   Smoking status: Never   Smokeless tobacco: Never  Vaping Use   Vaping Use: Never used  Substance and Sexual Activity   Alcohol use: Yes    Comment: two per month.   Drug use: No   Sexual activity: Yes    Birth control/protection: Surgical    Comment: BTL/ABLATION

## 2022-06-02 ENCOUNTER — Ambulatory Visit: Payer: Commercial Managed Care - HMO | Attending: Cardiology

## 2022-06-02 DIAGNOSIS — E7849 Other hyperlipidemia: Secondary | ICD-10-CM

## 2022-06-03 LAB — HEPATIC FUNCTION PANEL
ALT: 28 IU/L (ref 0–32)
AST: 27 IU/L (ref 0–40)
Albumin: 4.7 g/dL (ref 3.9–4.9)
Alkaline Phosphatase: 114 IU/L (ref 44–121)
Bilirubin Total: 0.5 mg/dL (ref 0.0–1.2)
Bilirubin, Direct: 0.15 mg/dL (ref 0.00–0.40)
Total Protein: 7.3 g/dL (ref 6.0–8.5)

## 2022-06-03 LAB — LIPID PANEL
Chol/HDL Ratio: 1.9 ratio (ref 0.0–4.4)
Cholesterol, Total: 130 mg/dL (ref 100–199)
HDL: 67 mg/dL (ref >39)
LDL Chol Calc (NIH): 36 mg/dL (ref 0–99)
Triglycerides: 170 mg/dL — ABNORMAL HIGH (ref 0–149)
VLDL Cholesterol Cal: 27 mg/dL (ref 5–40)

## 2022-06-09 ENCOUNTER — Ambulatory Visit (INDEPENDENT_AMBULATORY_CARE_PROVIDER_SITE_OTHER): Payer: Commercial Managed Care - HMO | Admitting: Physician Assistant

## 2022-06-09 ENCOUNTER — Encounter: Payer: Self-pay | Admitting: Physician Assistant

## 2022-06-09 DIAGNOSIS — M1711 Unilateral primary osteoarthritis, right knee: Secondary | ICD-10-CM

## 2022-06-09 NOTE — Progress Notes (Signed)
Office Visit Note   Patient: Sierra Lucas           Date of Birth: 16-Jun-1955           MRN: [id number] Visit Date: 06/09/2022              Requested by: Gweneth Dimitri, MD [address] [place],  [place] [address] PCP: Gweneth Dimitri, MD  Chief Complaint  Patient presents with   Right Knee - Pain      HPI: Sierra Lucas comes in today for originally to get an injection of her right knee.  She has a history of osteoarthritis and is planning on leaving on vacation tomorrow.  This morning she was taking her dogs into a doggy daycare and the dogs pulled and she came down onto the front of her right knee.  She was able to self ambulate.  She is not really having any pain with walking no instability.  She actually says her knee feels a lot better rates her pain is mild.  Assessment & Plan: Visit Diagnoses: Contusion right knee  Plan: I talked to her and she declined an x-ray today.  She has good stability she does have some bruising on her knee and a small abrasion.  No infectious process going on she is ambulating with ease.  I told her if she did have significant pain we would get an x-ray.  She does not want an injection today does not think she needs it.  Also told her to keep the abrasion clean with antibacterial soap and water.  Follow-Up Instructions: No follow-ups on file.   Ortho Exam  Patient is alert, oriented, no adenopathy, well-dressed, normal affect, normal respiratory effort.  Right knee compartments soft and nontender she has a small abrasion without any surrounding erythema.  She has good extensor and flexion mechanisms.  No joint line pain.  She does have tenderness over the top of the patella with an associated contusion I could not appreciate any effusion. Imaging: No results found. No images are attached to the encounter.  Labs: Lab Results  Component Value Date   REPTSTATUS 06/13/2010 FINAL 06/11/2010   REPTSTATUS 06/16/2010 FINAL 06/11/2010   GRAMSTAIN   06/11/2010    RARE WBC PRESENT, PREDOMINANTLY MONONUCLEAR NO SQUAMOUS EPITHELIAL CELLS SEEN NO ORGANISMS SEEN   GRAMSTAIN  06/11/2010    RARE WBC PRESENT, PREDOMINANTLY MONONUCLEAR NO SQUAMOUS EPITHELIAL CELLS SEEN NO ORGANISMS SEEN   CULT NO GROWTH 2 DAYS 06/11/2010   CULT NO ANAEROBES ISOLATED 06/11/2010     Lab Results  Component Value Date   ALBUMIN 4.7 06/02/2022   ALBUMIN 4.1 04/01/2022   ALBUMIN 3.2 (L) 01/07/2017    Lab Results  Component Value Date   MG 2.3 01/09/2017   No results found for: "VD25OH"  No results found for: "PREALBUMIN"    Latest Ref Rng & Units 01/09/2017   12:00 AM 01/08/2017    6:03 PM 01/08/2017    7:45 AM  CBC EXTENDED  WBC 4.0 - 10.5 K/uL 10.7  11.9  8.8   RBC 3.87 - 5.11 MIL/uL 2.79  2.93  2.70   Hemoglobin 12.0 - 15.0 g/dL 8.3  8.6  8.0   HCT 01.0 - 46.0 % 24.6  26.2  23.9   Platelets 150 - 400 K/uL 229  217  191      There is no height or weight on file to calculate BMI.  Orders:  No orders of the defined types were placed  in this encounter.  No orders of the defined types were placed in this encounter.    Procedures: No procedures performed  Clinical Data: No additional findings.  ROS:  All other systems negative, except as noted in the HPI. Review of Systems  Objective: Vital Signs: There were no vitals taken for this visit.  Specialty Comments:  No specialty comments available.  PMFS History: Patient Active Problem List   Diagnosis Date Noted   Unilateral primary osteoarthritis, right knee 09/10/2021   Pain in left ankle and joints of left foot 07/05/2021   Knee pain 08/10/2017   Gastrointestinal hemorrhage with melena 01/07/2017   Acute blood loss anemia 01/07/2017   CAD (coronary artery disease) of artery bypass graft 10/11/2013   Systemic hypertension 10/11/2013   Hyperlipidemia 10/11/2013   Hidradenitis suppurativa 10/06/2011   Menopause 07/30/2010   Wears glasses 07/30/2010   Family history of  breast cancer 07/30/2010   Past Medical History:  Diagnosis Date   Abnormal Pap smear 06/19/2005   ASCUS    Coronary artery disease    GI (gastrointestinal bleed) 01/08/2017   Headache(784.0)    HGSIL (high grade squamous intraepithelial dysplasia) 2005   Systemic hypertension 10/11/2013   PATIENT DENIES    Family History  Problem Relation Age of Onset   Heart disease Father    Breast cancer Maternal Grandmother     Past Surgical History:  Procedure Laterality Date   BLADDER SURGERY  02/10/2002   Ask patient to clarify. Not on history form.   CARDIAC SURGERY  02/10/1998   Bypass surgery.   CYSTECTOMY  06/11/2010   REMOVED ON BUTTOCKS    ESOPHAGOGASTRODUODENOSCOPY (EGD) WITH PROPOFOL Left 01/08/2017   Procedure: ESOPHAGOGASTRODUODENOSCOPY (EGD) WITH PROPOFOL;  Surgeon: Kerin Salen, MD;  Location: [facility] ENDOSCOPY;  Service: Gastroenterology;  Laterality: Left;   GALLBLADDER SURGERY  02/11/1979   Removal? Ask patient to clarify.   GASTRIC BYPASS  06/24/2012   INGUINAL HIDRADENITIS EXCISION  02/11/2011   NOVASURE ABLATION  07/18/2005   REDUCTION MAMMAPLASTY Bilateral    TUBAL LIGATION     Social History   Occupational History   Not on file  Tobacco Use   Smoking status: Never   Smokeless tobacco: Never  Vaping Use   Vaping Use: Never used  Substance and Sexual Activity   Alcohol use: Yes    Comment: two per month.   Drug use: No   Sexual activity: Yes    Birth control/protection: Surgical    Comment: BTL/ABLATION

## 2022-06-18 ENCOUNTER — Telehealth: Payer: Self-pay | Admitting: Physician Assistant

## 2022-06-18 ENCOUNTER — Ambulatory Visit: Payer: Commercial Managed Care - HMO | Attending: Internal Medicine | Admitting: Pharmacist

## 2022-06-18 VITALS — BP 108/62 | HR 75

## 2022-06-18 DIAGNOSIS — I10 Essential (primary) hypertension: Secondary | ICD-10-CM

## 2022-06-18 DIAGNOSIS — E7849 Other hyperlipidemia: Secondary | ICD-10-CM

## 2022-06-18 NOTE — Progress Notes (Signed)
Patient ID: RUCHI NEIS                 DOB: 1955/08/06                    MRN: [id number]      HPI: Sierra Lucas is a 67 y.o. female patient referred to lipid clinic by Dr. Katrinka Blazing who presents today for lipid and HTN follow up. PMH is significant for CABG in 2020 with LIMA to LAD, HTN, HLD, and family history of aneurysm. Previously seen for her lipids - intolerant to simvastatin and rosuvastatin (muscle pains). Started on ezetimibe which she tolerated well, then started on rosuvastatin 10mg  3x per week at PharmD visit on 2/20. She also experienced muscle pain on lower frequency dosing and was started on Repatha instead. Her follow up labs showed excellent reduction in LDL from 113 to 36.  Pt presents today for follow up. Recently returned from vacation in [place]. [place] and injured her knee the day before her vacation, still recovering. Has been making improvements in her diet including drinking more water, not drinking any more sweet tea, and eating healthier breakfasts. Reports losing 11 lbs so far. Tolerating Repatha well and is very pleased with her results. Reports med compliance. Has checked her BP a few times at home, doesn't like the squeezing sensation of BP cuff on her arm especially in the office. Home readings (cuff checks 3x and averages them) include: 127/86, 133/84, 130/85, 130/85. Discussed that she does take Effexor which can raise her BP.  Current Lipid Medications: ezetimibe 10mg  daily Current BP Medications: HCTZ 25mg  daily Intolerances: rosuvastatin 10mg  daily and 3x per week (muscle pains), simvastatin (muscle pain) Risk Factors: premature CAD, HTN LDL-C goal: 55mg /dL  Family History: The patient's family history includes Heart disease in her father.   Social History: no tobacco, 1 drink once every few weeks  Labs: 06/02/22: TC 130, TG 170, HDL 67, LDL 36 (Repatha 140mg  Q2W, ezetimibe 10mg  daily) 04/01/22: TC 201, TG 217, HDL 50, LDL 113 (ezetimibe 10mg   daily)      Component Value Date/Time   CHOL 130 06/02/2022 0725   TRIG 170 (H) 06/02/2022 0725   HDL 67 06/02/2022 0725   CHOLHDL 1.9 06/02/2022 0725   CHOLHDL 2 10/26/2014 0805   VLDL 32.8 10/26/2014 0805   LDLCALC 36 06/02/2022 0725   LABVLDL 27 06/02/2022 0725    Past Medical History:  Diagnosis Date   Abnormal Pap smear 06/19/2005   ASCUS    Coronary artery disease    GI (gastrointestinal bleed) 01/08/2017   Headache(784.0)    HGSIL (high grade squamous intraepithelial dysplasia) 2005   Systemic hypertension 10/11/2013   PATIENT DENIES    Current Outpatient Medications on File Prior to Visit  Medication Sig Dispense Refill   ALPRAZolam (XANAX) 0.5 MG tablet Take 0.5 mg by mouth as needed for anxiety.      aspirin 81 MG tablet Take 81 mg by mouth daily.      Biotin 1 MG CAPS Take 1 mg by mouth daily.      cetirizine (ZYRTEC) 10 MG tablet Take 10 mg by mouth daily.     Evolocumab (REPATHA SURECLICK) 140 MG/ML SOAJ Inject 140 mg into the skin every 14 (fourteen) days. 6 mL 3   ezetimibe (ZETIA) 10 MG tablet Take 1 tablet (10 mg total) by mouth daily. 90 tablet 3   hydrochlorothiazide (HYDRODIURIL) 25 MG tablet Take 25 mg by mouth daily.  traMADol (ULTRAM) 50 MG tablet Take 1 tablet (50 mg total) by mouth every 6 (six) hours as needed. 20 tablet 0   traZODone (DESYREL) 50 MG tablet Take 50 mg by mouth at bedtime as needed.     venlafaxine XR (EFFEXOR-XR) 150 MG 24 hr capsule Take 150 mg by mouth daily.     No current facility-administered medications on file prior to visit.    Allergies  Allergen Reactions   Iohexol Itching     pt had prior contrast yrs ago W/ itching.   50 mg benadryl po 15 mins before scan.  she still had itching of mouth, lips, throat and breast. needs po benadryl 1 hr prior per dr. Denny Levy, Onset Date: [id number]    Latex     Ask patient to clarify type/severity/reaction. Not indicated on history form dated 06/05/10.   Rosuvastatin     Muscle pain  on 10mg  3x/week   Simvastatin     Muscle pain   Sulfa Antibiotics     Ask patient to clarify type/severity/reaction. Not indicated on medical history form dated 06/05/10.    Assessment/Plan:  1. Hyperlipidemia - LDL improved from 113 to 36 after starting Repatha and is now at goal < 55 given premature ASCVD. Tolerating therapy well, previously intolerant to 2 statins. Continue current therapy. Mentioned she is changing to Medicare in Sept, will look for a plan that covers Repatha well and will let us know when she changes insurances as new prior authorization will need to be completed.  2. Hypertension - BP excellent and at goal < 130/61mmHg, will continue HCTZ 25mg  daily. Pt will increase physical activity as able and continue with heart healthy diet.  F/u with PharmD as needed.  Hermen Mario E. Dequavius Kuhner, PharmD, BCACP, CPP [facility] [address] [place], [place] [address] Phone: [phone]; Fax: [phone] 06/18/2022 3:05 PM

## 2022-06-18 NOTE — Patient Instructions (Signed)
Your blood pressure is excellent and at goal < 130/65mmHg  Your cholesterol is excellent and your LDL is at goal 55mg /dL  Continue taking your current medications

## 2022-06-18 NOTE — Telephone Encounter (Signed)
Patient asking to speak to [facility] about her knee and what to do next

## 2022-06-19 ENCOUNTER — Ambulatory Visit: Payer: Commercial Managed Care - HMO | Admitting: Physician Assistant

## 2022-07-24 ENCOUNTER — Ambulatory Visit (INDEPENDENT_AMBULATORY_CARE_PROVIDER_SITE_OTHER): Payer: Commercial Managed Care - HMO | Admitting: Orthopaedic Surgery

## 2022-07-24 ENCOUNTER — Encounter: Payer: Self-pay | Admitting: Orthopaedic Surgery

## 2022-07-24 VITALS — Ht 63.0 in | Wt 202.0 lb

## 2022-07-24 DIAGNOSIS — M1711 Unilateral primary osteoarthritis, right knee: Secondary | ICD-10-CM

## 2022-07-24 MED ORDER — METHYLPREDNISOLONE 4 MG PO TABS: ORAL_TABLET | ORAL | 0 refills | Status: DC

## 2022-07-24 NOTE — Progress Notes (Signed)
The patient is a very pleasant and active 67 year old female who was referred to me for a knee replacement of her right knee.  She has dealt with knee pain for many years now and has been getting significantly worse.  She has been told years ago that a knee replacement was recommended for her.  She does have significant valgus malalignment of that knee.  She has had gastric bypass in the past that she cannot take anti-inflammatories.  Steroid injections have only helped for short time in that knee.  She has had heart bypass surgery in the past and is not on any blood thinning medications at this point.  She tries to stay active and is much healthier than she has been in the past.  I have reviewed all of her notes within epic as well as past medical history and medications.  She currently denies any headache, chest pain, shortness of breath, fever, chills, nausea, vomiting.  X-rays earlier of the right knee show valgus malalignment which is significant.  There is tricompartment arthritis with significant lateral joint space narrowing as well as patellofemoral narrowing.  There is osteophytes in the lower compartments.  NuPrep on exam there is valgus malalignment of the knee and significant pain medial and lateral as well as the patellofemoral joint.  The knee is ligamentously stable but certainly significant pain throughout the arc of motion.  We had a long and thorough discussion about knee replacement surgery.  I showed her knee replacement model and described in detail what the surgery involves.  She would like to wait until the end of September.  It is too early for her to have a steroid injection in that knee since she had 1 in April so we will at least try a steroid taper.  I do not mind seeing her in 4 weeks to place a steroid injection in July in her right knee to help her the rest the summer since surgery will be in late September.  When we see her next month no x-rays are needed.  All questions and  concerns were answered and addressed.

## 2022-08-07 ENCOUNTER — Telehealth: Payer: Self-pay

## 2022-08-07 NOTE — Telephone Encounter (Signed)
I called patient to discuss scheduling TKA surgery.  Left voice mail for patient to return my call.

## 2022-08-27 ENCOUNTER — Ambulatory Visit: Payer: Commercial Managed Care - HMO | Admitting: Orthopaedic Surgery

## 2022-08-27 ENCOUNTER — Encounter: Payer: Self-pay | Admitting: Orthopaedic Surgery

## 2022-08-27 DIAGNOSIS — M1711 Unilateral primary osteoarthritis, right knee: Secondary | ICD-10-CM | POA: Diagnosis not present

## 2022-08-27 DIAGNOSIS — G8929 Other chronic pain: Secondary | ICD-10-CM

## 2022-08-27 DIAGNOSIS — M25561 Pain in right knee: Secondary | ICD-10-CM

## 2022-08-27 MED ORDER — METHYLPREDNISOLONE ACETATE 40 MG/ML IJ SUSP
40.0000 mg | INTRAMUSCULAR | Status: AC | PRN
Start: 2022-08-27 — End: 2022-08-27
  Administered 2022-08-27: 40 mg via INTRA_ARTICULAR

## 2022-08-27 MED ORDER — LIDOCAINE HCL 1 % IJ SOLN
3.0000 mL | INTRAMUSCULAR | Status: AC | PRN
Start: 2022-08-27 — End: 2022-08-27
  Administered 2022-08-27: 3 mL

## 2022-08-27 NOTE — Progress Notes (Signed)
The patient comes today already scheduled for knee replacement for her right knee due to severe arthritis.  This replacement is going to be on October 1.  She would like to have a steroid injection today which I think is reasonable and far enough time out from surgery.  This is to help get her through the rest of summer.  Her right knee has significant valgus malalignment and is swollen.  I did place a steroid injection per her request in the right knee without difficulty.  We talked again about her surgery and what to expect.  All question concerns were answered addressed.  Will see her at the time of surgery for her right knee replacement.    Procedure Note  Patient: Sierra Lucas             Date of Birth: [birth year]           MRN: [id number]             Visit Date: 08/27/2022  Procedures: Visit Diagnoses:  1. Unilateral primary osteoarthritis, right knee   2. Chronic pain of right knee     Large Joint Inj: R knee on 08/27/2022 3:08 PM Indications: diagnostic evaluation and pain Details: 22 G 1.5 in needle, superolateral approach  Arthrogram: No  Medications: 3 mL lidocaine 1 %; 40 mg methylPREDNISolone acetate 40 MG/ML Outcome: tolerated well, no immediate complications Procedure, treatment alternatives, risks and benefits explained, specific risks discussed. Consent was given by the patient. Immediately prior to procedure a time out was called to verify the correct patient, procedure, equipment, support staff and site/side marked as required. Patient was prepped and draped in the usual sterile fashion.

## 2022-10-13 ENCOUNTER — Encounter: Payer: Self-pay | Admitting: Orthopaedic Surgery

## 2022-10-24 ENCOUNTER — Other Ambulatory Visit: Payer: Self-pay

## 2022-10-31 NOTE — Pre-Procedure Instructions (Signed)
Surgical Instructions   Your procedure is scheduled on Tuesday, October 1st. Report to [facility] Main Entrance "A" at 05:30 A.M., then check in with the Admitting office. Any questions or running late day of surgery: call [phone]  Questions prior to your surgery date: call [phone], Monday-Friday, 8am-4pm. If you experience any cold or flu symptoms such as cough, fever, chills, shortness of breath, etc. between now and your scheduled surgery, please notify us at the above number.     Remember:  Do not eat after midnight the night before your surgery  You may drink clear liquids until 04:30 AM the morning of your surgery.   Clear liquids allowed are: Water, Non-Citrus Juices (without pulp), Carbonated Beverages, Clear Tea, Black Coffee Only (NO MILK, CREAM OR POWDERED CREAMER of any kind), and Gatorade.  Patient Instructions  The night before surgery:  No food after midnight. ONLY clear liquids after midnight  The day of surgery (if you do NOT have diabetes):  Drink ONE (1) Pre-Surgery Clear Ensure by 04:30 AM the morning of surgery. Drink in one sitting. Do not sip.  This drink was given to you during your hospital  pre-op appointment visit.  Nothing else to drink after completing the  Pre-Surgery Clear Ensure.          If you have questions, please contact your surgeon's office.    Take these medicines the morning of surgery with A SIP OF WATER  cetirizine (ZYRTEC)  ezetimibe (ZETIA)  pantoprazole (PROTONIX)  venlafaxine XR [facility])    May take these medicines IF NEEDED: ALPRAZolam (XANAX)  Naphazoline-Pheniramine (EYE ALLERGY RELIEF OP)    Follow your surgeon's instructions on when to stop Asprin.  If no instructions were given by your surgeon then you will need to call the office to get those instructions.     One week prior to surgery, STOP taking any Aleve, diclofenac Sodium (VOLTAREN ARTHRITIS PAIN) gel, Naproxen, Ibuprofen, Motrin, Advil, Goody's,  BC's, all herbal medications, fish oil, and non-prescription vitamins.   STOP TAKING    Semaglutide (OZEMPIC) 7 days prior to surgery. Last dose 9/23.            Do NOT Smoke (Tobacco/Vaping) for 24 hours prior to your procedure.  If you use a CPAP at night, you may bring your mask/headgear for your overnight stay.   You will be asked to remove any contacts, glasses, piercing's, hearing aid's, dentures/partials prior to surgery. Please bring cases for these items if needed.    Patients discharged the day of surgery will not be allowed to drive home, and someone needs to stay with them for 24 hours.  SURGICAL WAITING ROOM VISITATION Patients may have no more than 2 support people in the waiting area - these visitors may rotate.   Pre-op nurse will coordinate an appropriate time for 1 ADULT support person, who may not rotate, to accompany patient in pre-op.  Children under the age of 52 must have an adult with them who is not the patient and must remain in the main waiting area with an adult.  If the patient needs to stay at the hospital during part of their recovery, the visitor guidelines for inpatient rooms apply.  Please refer to the [facility] website for the visitor guidelines for any additional information.   If you received a COVID test during your pre-op visit  it is requested that you wear a mask when out in public, stay away from anyone that may not be feeling  well and notify your surgeon if you develop symptoms. If you have been in contact with anyone that has tested positive in the last 10 days please notify you surgeon.      Pre-operative 5 CHG Bathing Instructions   You can play a key role in reducing the risk of infection after surgery. Your skin needs to be as free of germs as possible. You can reduce the number of germs on your skin by washing with CHG (chlorhexidine gluconate) soap before surgery. CHG is an antiseptic soap that kills germs and continues to kill germs  even after washing.   DO NOT use if you have an allergy to chlorhexidine/CHG or antibacterial soaps. If your skin becomes reddened or irritated, stop using the CHG and notify one of our RNs at [phone].   Please shower with the CHG soap starting 4 days before surgery using the following schedule:     Please keep in mind the following:  DO NOT shave, including legs and underarms, starting the day of your first shower.   You may shave your face at any point before/day of surgery.  Place clean sheets on your bed the day you start using CHG soap. Use a clean washcloth (not used since being washed) for each shower. DO NOT sleep with pets once you start using the CHG.   CHG Shower Instructions:  Wash your face and private area with normal soap. If you choose to wash your hair, wash first with your normal shampoo.  After you use shampoo/soap, rinse your hair and body thoroughly to remove shampoo/soap residue.  Turn the water OFF and apply about 3 tablespoons (45 ml) of CHG soap to a CLEAN washcloth.  Apply CHG soap ONLY FROM YOUR NECK DOWN TO YOUR TOES (washing for 3-5 minutes)  DO NOT use CHG soap on face, private areas, open wounds, or sores.  Pay special attention to the area where your surgery is being performed.  If you are having back surgery, having someone wash your back for you may be helpful. Wait 2 minutes after CHG soap is applied, then you may rinse off the CHG soap.  Pat dry with a clean towel  Put on clean clothes/pajamas   If you choose to wear lotion, please use ONLY the CHG-compatible lotions on the back of this paper.   Additional instructions for the day of surgery: DO NOT APPLY any lotions, deodorants, cologne, or perfumes.   Do not bring valuables to the hospital. [facility] is not responsible for any belongings/valuables. Do not wear nail polish, gel polish, artificial nails, or any other type of covering on natural nails (fingers and toes) Do not wear jewelry or  makeup Put on clean/comfortable clothes.  Please brush your teeth.  Ask your nurse before applying any prescription medications to the skin.     CHG Compatible Lotions   Aveeno Moisturizing lotion  Cetaphil Moisturizing Cream  Cetaphil Moisturizing Lotion  Clairol Herbal Essence Moisturizing Lotion, Dry Skin  Clairol Herbal Essence Moisturizing Lotion, Extra Dry Skin  Clairol Herbal Essence Moisturizing Lotion, Normal Skin  Curel Age Defying Therapeutic Moisturizing Lotion with Alpha Hydroxy  Curel Extreme Care Body Lotion  Curel Soothing Hands Moisturizing Hand Lotion  Curel Therapeutic Moisturizing Cream, Fragrance-Free  Curel Therapeutic Moisturizing Lotion, Fragrance-Free  Curel Therapeutic Moisturizing Lotion, Original Formula  Eucerin Daily Replenishing Lotion  Eucerin Dry Skin Therapy Plus Alpha Hydroxy Crme  Eucerin Dry Skin Therapy Plus Alpha Hydroxy Lotion  Eucerin Original Crme  Eucerin Original  Lotion  Eucerin Plus Crme Eucerin Plus Lotion  Eucerin TriLipid Replenishing Lotion  Keri Anti-Bacterial Hand Lotion  Keri Deep Conditioning Original Lotion Dry Skin Formula Softly Scented  Keri Deep Conditioning Original Lotion, Fragrance Free Sensitive Skin Formula  Keri Lotion Fast Absorbing Fragrance Free Sensitive Skin Formula  Keri Lotion Fast Absorbing Softly Scented Dry Skin Formula  Keri Original Lotion  Keri Skin Renewal Lotion Keri Silky Smooth Lotion  Keri Silky Smooth Sensitive Skin Lotion  Nivea Body Creamy Conditioning Oil  Nivea Body Extra Enriched Lotion  Nivea Body Original Lotion  Nivea Body Sheer Moisturizing Lotion Nivea Crme  Nivea Skin Firming Lotion  NutraDerm 30 Skin Lotion  NutraDerm Skin Lotion  NutraDerm Therapeutic Skin Cream  NutraDerm Therapeutic Skin Lotion  ProShield Protective Hand Cream  Provon moisturizing lotion  Please read over the following fact sheets that you were given.

## 2022-11-03 ENCOUNTER — Other Ambulatory Visit: Payer: Self-pay

## 2022-11-03 ENCOUNTER — Encounter (HOSPITAL_COMMUNITY)
Admission: RE | Admit: 2022-11-03 | Discharge: 2022-11-03 | Disposition: A | Discharge status: Home / Self Care | Payer: Medicare HMO | Source: Ambulatory Visit | Attending: Orthopaedic Surgery

## 2022-11-03 ENCOUNTER — Encounter (HOSPITAL_COMMUNITY): Payer: Self-pay

## 2022-11-03 VITALS — BP 123/78 | HR 81 | Temp 98.0°F | Resp 17 | Ht 63.0 in | Wt 176.1 lb

## 2022-11-03 DIAGNOSIS — Z01812 Encounter for preprocedural laboratory examination: Secondary | ICD-10-CM | POA: Diagnosis not present

## 2022-11-03 DIAGNOSIS — M1711 Unilateral primary osteoarthritis, right knee: Secondary | ICD-10-CM | POA: Diagnosis not present

## 2022-11-03 DIAGNOSIS — Z01818 Encounter for other preprocedural examination: Secondary | ICD-10-CM

## 2022-11-03 HISTORY — DX: Fibromyalgia: M79.7

## 2022-11-03 HISTORY — DX: Anxiety disorder, unspecified: F41.9

## 2022-11-03 HISTORY — DX: Anemia, unspecified: D64.9

## 2022-11-03 HISTORY — DX: Cardiac murmur, unspecified: R01.1

## 2022-11-03 HISTORY — DX: Unilateral primary osteoarthritis, right knee: M17.11

## 2022-11-03 LAB — COMPREHENSIVE METABOLIC PANEL
ALT: 27 U/L (ref 0–44)
AST: 26 U/L (ref 15–41)
Albumin: 3.5 g/dL (ref 3.5–5.0)
Alkaline Phosphatase: 91 U/L (ref 38–126)
Anion gap: 9 (ref 5–15)
BUN: 14 mg/dL (ref 8–23)
CO2: 27 mmol/L (ref 22–32)
Calcium: 9.2 mg/dL (ref 8.9–10.3)
Chloride: 102 mmol/L (ref 98–111)
Creatinine, Ser: 0.66 mg/dL (ref 0.44–1.00)
GFR, Estimated: >60 mL/min (ref >60)
Glucose, Bld: 88 mg/dL (ref 70–99)
Potassium: 3.7 mmol/L (ref 3.5–5.1)
Sodium: 138 mmol/L (ref 135–145)
Total Bilirubin: 0.4 mg/dL (ref 0.3–1.2)
Total Protein: 6.8 g/dL (ref 6.5–8.1)

## 2022-11-03 LAB — SURGICAL PCR SCREEN
MRSA, PCR: NEGATIVE
Staphylococcus aureus: POSITIVE — AB

## 2022-11-03 LAB — CBC
HCT: 43.9 % (ref 36.0–46.0)
Hemoglobin: 14.6 g/dL (ref 12.0–15.0)
MCH: 30 pg (ref 26.0–34.0)
MCHC: 33.3 g/dL (ref 30.0–36.0)
MCV: 90.1 fL (ref 80.0–100.0)
Platelets: 285 10*3/uL (ref 150–400)
RBC: 4.87 MIL/uL (ref 3.87–5.11)
RDW: 13.1 % (ref 11.5–15.5)
WBC: 12.4 10*3/uL — ABNORMAL HIGH (ref 4.0–10.5)
nRBC: 0 % (ref 0.0–0.2)

## 2022-11-03 NOTE — Progress Notes (Signed)
Anesthesia APP Evaluatoin:  Case: [id number] Date/Time: 11/11/22 0715   Procedure: RIGHT TOTAL KNEE ARTHROPLASTY (Right: Knee)   Anesthesia type: Spinal   Pre-op diagnosis: osteoarthritis right knee   [facility]: [facility] ROOM 07 / [facility] OR   Surgeons: Kathryne Hitch, MD       DISCUSSION: Patient is a 67 year old female scheduled for the above procedure. She was evaluated during her 11/03/22 PAT visit.   History includes never smoker, HTN, murmur, CAD (LIMA-LAD 11/19/98), iron deficiency anemia (GI bleed, gastric ulcer 01/08/17), fibromyalgia, osteoarthritis, gastric bypass (06/25/22), hidradenitis (excision, left buttocks 06/11/10). Right knee arthroscopy, meniscectomy 07/22/19.   Her primary cardiologist was Dr. Verdis Prime until his recent retirement, he had planned on 6-12 month follow-up (~ 6 month for [facility] and ~ 12 months for cardiology provider). She was to get established with Laurance Flatten, MD but she relocated. She is due for follow-up ~ January 2025, but is waiting to find out which cardiologist she will be assigned to. Last encounters have been with the [facility] in February and March 2024. She is now on Rypatha as she was have muscle pain was with pharmacist for Rypatha.   She denied chest pain or SOB. She had syncope evaluation in 2006, but denied any recent syncope.  She gets a vaginal right knee swelling but otherwise no edema.  Prior to 3 years ago she was walking daily usually 90 minutes which included steps and inclines.  She is still able to walk but not as far.  She lives in a second floor apartment so does go up a flight of stairs on a regular basis without CV symptoms.  She also rides a stationary bike and occasionally rides on an outside bike on the [address] for 30 to 40 minutes at a time.  When she lived in [place], she saw cardiologist Dr. Drue Second. She reported unremarkable cardiac testing done there in 2021. Records requested.   She reported being  told she could continue baby ASA.  Last semaglutide 11/03/22 and know to hold until after surgery.  If 2021 records received then will update my note; However, she reports METS > 4  and no CV or HF symptoms. If no acute changes then I anticipate that she can proceed as planned. I discussed with anesthesiologist Leslye Peer, MD.    VS: BP 123/78   Pulse 81   Temp 36.7 C   Resp 17   Ht 5\' 3"  (1.6 m)   Wt 79.9 kg   SpO2 100%   BMI 31.19 kg/m  Heart regular rate and rhythm. No murmur noted.  Lungs clear.  No carotid bruits noted.  No ankle edema.   PROVIDERS: Gweneth Dimitri, MD is PCP  Verdis Prime, MD is cardiologist, but now retired.  She is waiting for [facility] to assign her new cardiologist   LABS: Labs reviewed: Acceptable for surgery. (all labs ordered are listed, but only abnormal results are displayed)  Labs Reviewed  SURGICAL PCR SCREEN - Abnormal; Notable for the following components:      Result Value   Staphylococcus aureus POSITIVE (*)    All other components within normal limits  CBC - Abnormal; Notable for the following components:   WBC 12.4 (*)    All other components within normal limits  COMPREHENSIVE METABOLIC PANEL    IMAGES: Xray right knee 05/20/22: Radiographs.  Right knee reviewed today.  She has advanced  tricompartmental arthritis with periarticular osteophytes and sclerotic  changes.  No evidence of an acute fracture   CT Abd/pelvis 01/15/22: IMPRESSION: 1. No acute abdominal/pelvic findings, mass lesions or adenopathy. 2. Surgical changes from gastric bypass surgery but no complicating features are identified. 3. Status post cholecystectomy. No biliary dilatation.  CTA Chest 02/27/21: IMPRESSION: 1. No evidence of thoracic aortic aneurysm. 2. Aortic and native coronary artery atherosclerotic calcifications. Evidence of prior CABG. 3. Small pulmonary nodules measuring 2 and 4 mm in the periphery of the right upper and lower lobes. No  follow-up needed if patient is low-risk (and has no known or suspected primary neoplasm). Non-contrast chest CT can be considered in 12 months if patient is high-risk. This recommendation follows the consensus statement: Guidelines for Management of Incidental Pulmonary Nodules Detected on CT Images:From the Fleischner Society 2017; published online before print (10.1148/radiol.[id number]). 4. Surgical changes of gastric bypass procedure. - Per Dr. Verdis Prime, "Let the patient know there is no aneurysm. Nodules noted on CT in chest. Discuss with PCP and whether f/u needed. I think no f/u needed but will yield to PCP judgement."   EKG: EKG 02/14/22: NSR. Negative anterior T wave V1, V2 (old).    CV: Echo (Dr. Mardelle Matte, [place], [place]): Requested. Stress Test (Dr. Mardelle Matte, [place], [place]): Requested.  Per 03/29/04 cardiology notation by Dr. Armanda Magic:  "This is a very pleasant 67 year old white female with a history of chest pain dating back to 2000.  This ultimately led to a cardiac catheterization which revealed an ostial LAD lesion by intravascular ultrasound of less than 70%.  Because of its location, it ws felt not to be a lesion that could be easily treated by PCI, and therefore the patient underwent coronary artery bypass grafting with a LIMA to the LAD...approximately two months after her surgery had another catheterization done which showed diminished flow through the LIMA with a more widely patent LAD, with good runoff of the LAD.  There appeared to be less than 50% LAD stenosis. Subsequently, she has been managed medically for the possibility of coronary artery vasospasm." She had a tilt table test per Dr. Mayford Knife on 03/29/04 which was negative for syncope.   Past Medical History:  Diagnosis Date   Abnormal Pap smear 06/19/2005   ASCUS    Anemia    iron deficiency   Anxiety    Coronary artery disease    Fibromyalgia    GI (gastrointestinal bleed) 01/08/2017    Headache(784.0)    Heart murmur    since birth   HGSIL (high grade squamous intraepithelial dysplasia) 2005   Osteoarthritis of right knee    Systemic hypertension 10/11/2013   PATIENT DENIES    Past Surgical History:  Procedure Laterality Date   BLADDER SURGERY  02/10/2002   Ask patient to clarify. Not on history form.   CARDIAC SURGERY  11/19/1998   CABG: LIMA-LAD   CHOLECYSTECTOMY  1981   CYSTECTOMY  06/11/2010   REMOVED ON BUTTOCKS    ESOPHAGOGASTRODUODENOSCOPY (EGD) WITH PROPOFOL Left 01/08/2017   Procedure: ESOPHAGOGASTRODUODENOSCOPY (EGD) WITH PROPOFOL;  Surgeon: Kerin Salen, MD;  Location: [facility] ENDOSCOPY;  Service: Gastroenterology;  Laterality: Left;   GASTRIC BYPASS  06/24/2012   INGUINAL HIDRADENITIS EXCISION  02/11/2011   NOVASURE ABLATION  07/18/2005   REDUCTION MAMMAPLASTY Bilateral    TUBAL LIGATION      MEDICATIONS:  ALPRAZolam (XANAX) 0.5 MG tablet   aspirin 81 MG tablet   Biotin 1 MG CAPS   cetirizine (ZYRTEC) 10 MG tablet   diclofenac Sodium (VOLTAREN ARTHRITIS PAIN)  1 % GEL   Evolocumab (REPATHA SURECLICK) 140 MG/ML SOAJ   ezetimibe (ZETIA) 10 MG tablet   hydrochlorothiazide (HYDRODIURIL) 25 MG tablet   Multiple Vitamins-Minerals (MULTIVITAMIN WITH MINERALS) tablet   Naphazoline-Pheniramine (EYE ALLERGY RELIEF OP)   pantoprazole (PROTONIX) 40 MG tablet   Semaglutide, 1 MG/DOSE, (OZEMPIC, 1 MG/DOSE,) 2 MG/1.5ML SOPN   traMADol (ULTRAM) 50 MG tablet   traZODone (DESYREL) 50 MG tablet   venlafaxine XR (EFFEXOR-XR) 150 MG 24 hr capsule   No current facility-administered medications for this encounter.    Shonna Chock, PA-C Surgical Short Stay/Anesthesiology [facility] [place] [facility] Phone [phone] [facility] Phone [phone] 11/04/2022 5:16 PM

## 2022-11-03 NOTE — Progress Notes (Addendum)
PCP - Dr. Gweneth Dimitri Cardiologist - Dr. Verdis Prime III   PPM/ICD - denies   Chest x-ray - 01/07/17 EKG - 02/14/22 Stress Test - 2021 per pt, in [place], [place] ECHO - 2021 per pt, in [place], [place] Cardiac Cath - 2000  Sleep Study - denies   DM- denies Ozempic, for weight loss, last dose 9/23.  Blood Thinner Instructions: n/a Aspirin Instructions: f/u with surgeon  ERAS Protcol - yes PRE-SURGERY Ensure given at PAT  COVID TEST- n/a   Anesthesia review: yes, records requested. Revonda Standard saw pt in PAT  Patient denies shortness of breath, fever, cough and chest pain at PAT appointment   All instructions explained to the patient, with a verbal understanding of the material. Patient agrees to go over the instructions while at home for a better understanding.  The opportunity to ask questions was provided.

## 2022-11-04 NOTE — Anesthesia Preprocedure Evaluation (Addendum)
Anesthesia Evaluation  Patient identified by MRN, date of birth, ID band Patient awake    Reviewed: Allergy & Precautions, NPO status , Patient's Chart, lab work & pertinent test results  History of Anesthesia Complications Negative for: history of anesthetic complications  Airway Mallampati: II  TM Distance: >3 FB Neck ROM: Full    Dental  (+) Dental Advisory Given   Pulmonary neg pulmonary ROS   Pulmonary exam normal        Cardiovascular hypertension, Pt. on medications + CAD  Normal cardiovascular exam     Neuro/Psych  Headaches PSYCHIATRIC DISORDERS Anxiety        GI/Hepatic negative GI ROS, Neg liver ROS,,,  Endo/Other   Obesity   Renal/GU negative Renal ROS     Musculoskeletal  (+) Arthritis ,  Fibromyalgia -  Abdominal   Peds  Hematology negative hematology ROS (+)  Plt 285k    Anesthesia Other Findings On GLP-1a   Reproductive/Obstetrics                             Anesthesia Physical Anesthesia Plan  ASA: 3  Anesthesia Plan: Spinal   Post-op Pain Management: Tylenol PO (pre-op)* and Regional block*   Induction:   PONV Risk Score and Plan: 2 and Treatment may vary due to age or medical condition and Propofol infusion  Airway Management Planned: Natural Airway and Simple Face Mask  Additional Equipment: None  Intra-op Plan:   Post-operative Plan:   Informed Consent: I have reviewed the patients History and Physical, chart, labs and discussed the procedure including the risks, benefits and alternatives for the proposed anesthesia with the patient or authorized representative who has indicated his/her understanding and acceptance.       Plan Discussed with: CRNA and Anesthesiologist  Anesthesia Plan Comments: (  )       Anesthesia Quick Evaluation

## 2022-11-07 ENCOUNTER — Telehealth: Payer: Self-pay | Admitting: *Deleted

## 2022-11-07 NOTE — Telephone Encounter (Signed)
Ortho bundle pre-op call completed. 

## 2022-11-07 NOTE — Care Plan (Signed)
[facility] RNCM call to patient to discuss her upcoming Right total knee arthroplasty with Dr. Magnus Ivan. She is an Ortho bundle patient and is agreeable to case management. She plans to return home with assistance from her husband. She has a RW and cane already. Don't anticipate any other DME. Anticipate HHPT will be needed after a short hospital stay. Referral made to [facility] after choice provided. Reviewed all post op care instructions. Will continue to follow for needs.

## 2022-11-07 NOTE — Telephone Encounter (Signed)
Attempted pre-op call to patient; no answer and left VM requesting call back.

## 2022-11-10 NOTE — H&P (Signed)
TOTAL KNEE ADMISSION H&P  Patient is being admitted for right total knee arthroplasty.  Subjective:  Chief Complaint:right knee pain.  HPI: Sierra Lucas, 67 y.o. female, has a history of pain and functional disability in the right knee due to arthritis and has failed non-surgical conservative treatments for greater than 12 weeks to includeNSAID's and/or analgesics, corticosteriod injections, viscosupplementation injections, flexibility and strengthening excercises, use of assistive devices, weight reduction as appropriate, and activity modification.  Onset of symptoms was gradual, starting 4 years ago with gradually worsening course since that time. The patient noted no past surgery on the right knee(s).  Patient currently rates pain in the right knee(s) at 10 out of 10 with activity. Patient has night pain, worsening of pain with activity and weight bearing, pain that interferes with activities of daily living, and pain with passive range of motion.  Patient has evidence of subchondral sclerosis, periarticular osteophytes, and joint space narrowing by imaging studies. There is no active infection.  Patient Active Problem List   Diagnosis Date Noted   Unilateral primary osteoarthritis, right knee 09/10/2021   Pain in left ankle and joints of left foot 07/05/2021   Knee pain 08/10/2017   Gastrointestinal hemorrhage with melena 01/07/2017   Acute blood loss anemia 01/07/2017   CAD (coronary artery disease) of artery bypass graft 10/11/2013   Systemic hypertension 10/11/2013   Hyperlipidemia 10/11/2013   Hidradenitis suppurativa 10/06/2011   Menopause 07/30/2010   Wears glasses 07/30/2010   Family history of breast cancer 07/30/2010   Past Medical History:  Diagnosis Date   Abnormal Pap smear 06/19/2005   ASCUS    Anemia    iron deficiency   Anxiety    Coronary artery disease    Fibromyalgia    GI (gastrointestinal bleed) 01/08/2017   Headache(784.0)    Heart murmur    since  birth   HGSIL (high grade squamous intraepithelial dysplasia) 2005   Osteoarthritis of right knee    Systemic hypertension 10/11/2013   PATIENT DENIES    Past Surgical History:  Procedure Laterality Date   ABDOMINOPLASTY     after breast reduction   BLADDER SURGERY  02/10/2002   bladder sling   CARDIAC SURGERY  11/19/1998   CABG: LIMA-LAD   CHOLECYSTECTOMY  1981   CYSTECTOMY  06/11/2010   REMOVED ON BUTTOCKS    ESOPHAGOGASTRODUODENOSCOPY (EGD) WITH PROPOFOL Left 01/08/2017   Procedure: ESOPHAGOGASTRODUODENOSCOPY (EGD) WITH PROPOFOL;  Surgeon: Kerin Salen, MD;  Location: [facility] ENDOSCOPY;  Service: Gastroenterology;  Laterality: Left;   GASTRIC BYPASS  06/24/2012   INGUINAL HIDRADENITIS EXCISION  02/11/2011   NOVASURE ABLATION  07/18/2005   REDUCTION MAMMAPLASTY Bilateral    TUBAL LIGATION      No current facility-administered medications for this encounter.   Current Outpatient Medications  Medication Sig Dispense Refill Last Dose   ALPRAZolam (XANAX) 0.5 MG tablet Take 0.25 mg by mouth daily as needed for anxiety (flying).      aspirin 81 MG tablet Take 81 mg by mouth daily.       Biotin 1 MG CAPS Take 1 mg by mouth daily.       cetirizine (ZYRTEC) 10 MG tablet Take 10 mg by mouth daily.      diclofenac Sodium (VOLTAREN ARTHRITIS PAIN) 1 % GEL Apply 2 g topically daily as needed (Knee pain).      Evolocumab (REPATHA SURECLICK) 140 MG/ML SOAJ Inject 140 mg into the skin every 14 (fourteen) days. 6 mL 3    ezetimibe (  ZETIA) 10 MG tablet Take 1 tablet (10 mg total) by mouth daily. 90 tablet 3    hydrochlorothiazide (HYDRODIURIL) 25 MG tablet Take 25 mg by mouth daily.      Multiple Vitamins-Minerals (MULTIVITAMIN WITH MINERALS) tablet Take 1 tablet by mouth daily. Woman 50+      Naphazoline-Pheniramine (EYE ALLERGY RELIEF OP) Place 1 drop into both eyes daily as needed (Itiching and burning).      pantoprazole (PROTONIX) 40 MG tablet Take 40 mg by mouth daily.      Semaglutide, 1  MG/DOSE, (OZEMPIC, 1 MG/DOSE,) 2 MG/1.5ML SOPN Inject 1.5 mg into the skin once a week.      traZODone (DESYREL) 50 MG tablet Take 50 mg by mouth at bedtime.      venlafaxine XR (EFFEXOR-XR) 150 MG 24 hr capsule Take 150 mg by mouth daily.      traMADol (ULTRAM) 50 MG tablet Take 1 tablet (50 mg total) by mouth every 6 (six) hours as needed. (Patient not taking: Reported on 10/29/2022) 20 tablet 0 Not Taking   Allergies  Allergen Reactions   Iohexol Itching     pt had prior contrast yrs ago W/ itching.   50 mg benadryl po 15 mins before scan.  she still had itching of mouth, lips, throat and breast. needs po benadryl 1 hr prior per dr. Denny Levy, Onset Date: [id number]    Latex    Morphine Other (See Comments)    hallucinations   Rosuvastatin     Muscle pain on 10mg  3x/week and daily dosing   Simvastatin     Muscle pain   Sulfa Antibiotics     Social History   Tobacco Use   Smoking status: Never   Smokeless tobacco: Never  Substance Use Topics   Alcohol use: Yes    Comment: two drinks per month.    Family History  Problem Relation Age of Onset   Heart disease Father    Breast cancer Maternal Grandmother      Review of Systems  Objective:  Physical Exam Vitals reviewed.  Constitutional:      Appearance: Normal appearance.  HENT:     Head: Normocephalic and atraumatic.  Eyes:     Extraocular Movements: Extraocular movements intact.     Pupils: Pupils are equal, round, and reactive to light.  Cardiovascular:     Rate and Rhythm: Normal rate.     Pulses: Normal pulses.  Pulmonary:     Effort: Pulmonary effort is normal.     Breath sounds: Normal breath sounds.  Abdominal:     Palpations: Abdomen is soft.  Musculoskeletal:     Cervical back: Normal range of motion and neck supple.     Right knee: Effusion, bony tenderness and crepitus present. Decreased range of motion. Tenderness present over the medial joint line and lateral joint line. Abnormal alignment.   Neurological:     Mental Status: She is alert and oriented to person, place, and time.  Psychiatric:        Behavior: Behavior normal.     Vital signs in last 24 hours:    Labs:   Estimated body mass index is 31.19 kg/m as calculated from the following:   Height as of 11/03/22: 5\' 3"  (1.6 m).   Weight as of 11/03/22: 79.9 kg.   Imaging Review Plain radiographs demonstrate severe degenerative joint disease of the right knee(s). The overall alignment ismild valgus. The bone quality appears to be good for age and reported activity  level.      Assessment/Plan:  End stage arthritis, right knee   The patient history, physical examination, clinical judgment of the provider and imaging studies are consistent with end stage degenerative joint disease of the right knee(s) and total knee arthroplasty is deemed medically necessary. The treatment options including medical management, injection therapy arthroscopy and arthroplasty were discussed at length. The risks and benefits of total knee arthroplasty were presented and reviewed. The risks due to aseptic loosening, infection, stiffness, patella tracking problems, thromboembolic complications and other imponderables were discussed. The patient acknowledged the explanation, agreed to proceed with the plan and consent was signed. Patient is being admitted for inpatient treatment for surgery, pain control, PT, OT, prophylactic antibiotics, VTE prophylaxis, progressive ambulation and ADL's and discharge planning. The patient is planning to be discharged home with home health services

## 2022-11-11 ENCOUNTER — Observation Stay (HOSPITAL_COMMUNITY)
Admission: RE | Admit: 2022-11-11 | Discharge: 2022-11-12 | Disposition: A | Discharge status: Home / Self Care | Payer: Medicare HMO | Attending: Orthopaedic Surgery | Admitting: Orthopaedic Surgery

## 2022-11-11 ENCOUNTER — Other Ambulatory Visit: Payer: Self-pay

## 2022-11-11 ENCOUNTER — Encounter (HOSPITAL_COMMUNITY)
Admission: RE | Disposition: A | Discharge status: Home / Self Care | Payer: Self-pay | Source: Home / Self Care | Attending: Orthopaedic Surgery

## 2022-11-11 ENCOUNTER — Encounter (HOSPITAL_COMMUNITY): Payer: Self-pay | Admitting: Orthopaedic Surgery

## 2022-11-11 ENCOUNTER — Ambulatory Visit (HOSPITAL_COMMUNITY): Payer: Medicare HMO | Admitting: Vascular Surgery

## 2022-11-11 ENCOUNTER — Ambulatory Visit (HOSPITAL_COMMUNITY): Payer: Self-pay | Admitting: Anesthesiology

## 2022-11-11 ENCOUNTER — Observation Stay (HOSPITAL_COMMUNITY): Payer: Medicare HMO

## 2022-11-11 DIAGNOSIS — Z79899 Other long term (current) drug therapy: Secondary | ICD-10-CM | POA: Diagnosis not present

## 2022-11-11 DIAGNOSIS — I10 Essential (primary) hypertension: Secondary | ICD-10-CM | POA: Diagnosis not present

## 2022-11-11 DIAGNOSIS — M179 Osteoarthritis of knee, unspecified: Secondary | ICD-10-CM | POA: Diagnosis present

## 2022-11-11 DIAGNOSIS — Z91040 Latex allergy status: Secondary | ICD-10-CM | POA: Insufficient documentation

## 2022-11-11 DIAGNOSIS — M1711 Unilateral primary osteoarthritis, right knee: Secondary | ICD-10-CM

## 2022-11-11 DIAGNOSIS — Z96651 Presence of right artificial knee joint: Secondary | ICD-10-CM

## 2022-11-11 DIAGNOSIS — Z7982 Long term (current) use of aspirin: Secondary | ICD-10-CM | POA: Insufficient documentation

## 2022-11-11 DIAGNOSIS — I2510 Atherosclerotic heart disease of native coronary artery without angina pectoris: Secondary | ICD-10-CM | POA: Insufficient documentation

## 2022-11-11 DIAGNOSIS — G8918 Other acute postprocedural pain: Secondary | ICD-10-CM | POA: Diagnosis not present

## 2022-11-11 DIAGNOSIS — R609 Edema, unspecified: Secondary | ICD-10-CM | POA: Diagnosis not present

## 2022-11-11 HISTORY — PX: TOTAL KNEE ARTHROPLASTY: SHX125

## 2022-11-11 SURGERY — ARTHROPLASTY, KNEE, TOTAL
Anesthesia: Spinal | Site: Knee | Laterality: Right

## 2022-11-11 MED ORDER — OXYCODONE HCL 5 MG PO TABS: 5.0000 mg | ORAL_TABLET | Freq: Once | ORAL | Status: DC | PRN

## 2022-11-11 MED ORDER — FENTANYL CITRATE (PF) 100 MCG/2ML IJ SOLN
INTRAMUSCULAR | Status: AC
  Filled 2022-11-11: qty 2

## 2022-11-11 MED ORDER — PANTOPRAZOLE SODIUM 40 MG PO TBEC
40.0000 mg | DELAYED_RELEASE_TABLET | Freq: Every day | ORAL | Status: DC
  Administered 2022-11-12: 40 mg via ORAL
  Filled 2022-11-11: qty 1

## 2022-11-11 MED ORDER — LACTATED RINGERS IV SOLN: INTRAVENOUS | Status: DC

## 2022-11-11 MED ORDER — SODIUM CHLORIDE 0.9 % IV SOLN: INTRAVENOUS | Status: DC

## 2022-11-11 MED ORDER — ORAL CARE MOUTH RINSE: 15.0000 mL | Freq: Once | OROMUCOSAL | Status: AC

## 2022-11-11 MED ORDER — METOCLOPRAMIDE HCL 5 MG PO TABS: 5.0000 mg | ORAL_TABLET | Freq: Three times a day (TID) | ORAL | Status: DC | PRN

## 2022-11-11 MED ORDER — POLYETHYLENE GLYCOL 3350 17 G PO PACK: 17.0000 g | PACK | Freq: Every day | ORAL | Status: DC | PRN

## 2022-11-11 MED ORDER — DIPHENHYDRAMINE HCL 12.5 MG/5ML PO ELIX: 12.5000 mg | ORAL_SOLUTION | ORAL | Status: DC | PRN

## 2022-11-11 MED ORDER — FENTANYL CITRATE (PF) 250 MCG/5ML IJ SOLN
INTRAMUSCULAR | Status: AC
  Filled 2022-11-11: qty 5

## 2022-11-11 MED ORDER — SODIUM CHLORIDE 0.9 % IR SOLN
Status: DC | PRN
  Administered 2022-11-11: 1000 mL

## 2022-11-11 MED ORDER — ALPRAZOLAM 0.25 MG PO TABS
0.2500 mg | ORAL_TABLET | Freq: Every day | ORAL | Status: DC | PRN
  Administered 2022-11-11: 0.25 mg via ORAL
  Filled 2022-11-11: qty 1

## 2022-11-11 MED ORDER — KETOROLAC TROMETHAMINE 15 MG/ML IJ SOLN
7.5000 mg | Freq: Four times a day (QID) | INTRAMUSCULAR | Status: AC
  Administered 2022-11-11 (×3): 7.5 mg via INTRAVENOUS
  Filled 2022-11-11 (×3): qty 1

## 2022-11-11 MED ORDER — CEFAZOLIN SODIUM-DEXTROSE 1-4 GM/50ML-% IV SOLN
1.0000 g | Freq: Four times a day (QID) | INTRAVENOUS | Status: AC
  Administered 2022-11-11 (×2): 1 g via INTRAVENOUS
  Filled 2022-11-11 (×2): qty 50

## 2022-11-11 MED ORDER — PHENOL 1.4 % MT LIQD: 1.0000 | OROMUCOSAL | Status: DC | PRN

## 2022-11-11 MED ORDER — ALUM & MAG HYDROXIDE-SIMETH 200-200-20 MG/5ML PO SUSP: 30.0000 mL | ORAL | Status: DC | PRN

## 2022-11-11 MED ORDER — ONDANSETRON HCL 4 MG/2ML IJ SOLN
4.0000 mg | Freq: Four times a day (QID) | INTRAMUSCULAR | Status: DC | PRN
  Administered 2022-11-11: 4 mg via INTRAVENOUS
  Filled 2022-11-11: qty 2

## 2022-11-11 MED ORDER — HYDROMORPHONE HCL 1 MG/ML IJ SOLN
0.5000 mg | INTRAMUSCULAR | Status: DC | PRN
  Administered 2022-11-11 (×2): 1 mg via INTRAVENOUS
  Filled 2022-11-11 (×2): qty 1

## 2022-11-11 MED ORDER — DEXAMETHASONE SODIUM PHOSPHATE 10 MG/ML IJ SOLN
INTRAMUSCULAR | Status: AC
  Filled 2022-11-11: qty 1

## 2022-11-11 MED ORDER — BUPIVACAINE-EPINEPHRINE (PF) 0.25% -1:200000 IJ SOLN
INTRAMUSCULAR | Status: AC
  Filled 2022-11-11: qty 30

## 2022-11-11 MED ORDER — POVIDONE-IODINE 10 % EX SWAB: 2.0000 | Freq: Once | CUTANEOUS | Status: DC

## 2022-11-11 MED ORDER — PROPOFOL 500 MG/50ML IV EMUL
INTRAVENOUS | Status: DC | PRN
  Administered 2022-11-11: 100 ug/kg/min via INTRAVENOUS

## 2022-11-11 MED ORDER — FENTANYL CITRATE (PF) 100 MCG/2ML IJ SOLN
25.0000 ug | INTRAMUSCULAR | Status: DC | PRN
  Administered 2022-11-11: 50 ug via INTRAVENOUS

## 2022-11-11 MED ORDER — CEFAZOLIN SODIUM-DEXTROSE 2-4 GM/100ML-% IV SOLN
2.0000 g | INTRAVENOUS | Status: AC
  Administered 2022-11-11: 2 g via INTRAVENOUS
  Filled 2022-11-11: qty 100

## 2022-11-11 MED ORDER — METHOCARBAMOL 500 MG PO TABS
500.0000 mg | ORAL_TABLET | Freq: Four times a day (QID) | ORAL | Status: DC | PRN
  Administered 2022-11-11 – 2022-11-12 (×4): 500 mg via ORAL
  Filled 2022-11-11 (×4): qty 1

## 2022-11-11 MED ORDER — ONDANSETRON HCL 4 MG/2ML IJ SOLN
INTRAMUSCULAR | Status: DC | PRN
  Administered 2022-11-11: 4 mg via INTRAVENOUS

## 2022-11-11 MED ORDER — BUPIVACAINE-EPINEPHRINE (PF) 0.25% -1:200000 IJ SOLN
INTRAMUSCULAR | Status: DC | PRN
Start: 2022-11-11 — End: 2022-11-11
  Administered 2022-11-11: 30 mL via PERINEURAL

## 2022-11-11 MED ORDER — TRAZODONE HCL 50 MG PO TABS
50.0000 mg | ORAL_TABLET | Freq: Every day | ORAL | Status: DC
  Administered 2022-11-11: 50 mg via ORAL
  Filled 2022-11-11: qty 1

## 2022-11-11 MED ORDER — DOCUSATE SODIUM 100 MG PO CAPS
100.0000 mg | ORAL_CAPSULE | Freq: Two times a day (BID) | ORAL | Status: DC
  Administered 2022-11-11 – 2022-11-12 (×3): 100 mg via ORAL
  Filled 2022-11-11 (×3): qty 1

## 2022-11-11 MED ORDER — GABAPENTIN 100 MG PO CAPS
100.0000 mg | ORAL_CAPSULE | Freq: Three times a day (TID) | ORAL | Status: DC
  Administered 2022-11-11 – 2022-11-12 (×4): 100 mg via ORAL
  Filled 2022-11-11 (×4): qty 1

## 2022-11-11 MED ORDER — MIDAZOLAM HCL 2 MG/2ML IJ SOLN
INTRAMUSCULAR | Status: DC | PRN
  Administered 2022-11-11: 2 mg via INTRAVENOUS

## 2022-11-11 MED ORDER — TRANEXAMIC ACID-NACL 1000-0.7 MG/100ML-% IV SOLN
1000.0000 mg | INTRAVENOUS | Status: AC
  Administered 2022-11-11: 1000 mg via INTRAVENOUS
  Filled 2022-11-11: qty 100

## 2022-11-11 MED ORDER — BUPIVACAINE IN DEXTROSE 0.75-8.25 % IT SOLN
INTRATHECAL | Status: DC | PRN
  Administered 2022-11-11: 1.6 mL via INTRATHECAL

## 2022-11-11 MED ORDER — PHENYLEPHRINE HCL-NACL 20-0.9 MG/250ML-% IV SOLN
INTRAVENOUS | Status: DC | PRN
  Administered 2022-11-11: 20 ug/min via INTRAVENOUS

## 2022-11-11 MED ORDER — LIDOCAINE 2% (20 MG/ML) 5 ML SYRINGE
INTRAMUSCULAR | Status: DC | PRN
  Administered 2022-11-11: 60 mg via INTRAVENOUS

## 2022-11-11 MED ORDER — ONDANSETRON HCL 4 MG PO TABS
4.0000 mg | ORAL_TABLET | Freq: Four times a day (QID) | ORAL | Status: DC | PRN
  Administered 2022-11-12: 4 mg via ORAL
  Filled 2022-11-11: qty 1

## 2022-11-11 MED ORDER — CHLORHEXIDINE GLUCONATE 0.12 % MT SOLN: 15.0000 mL | Freq: Once | OROMUCOSAL | Status: AC

## 2022-11-11 MED ORDER — MIDAZOLAM HCL 2 MG/2ML IJ SOLN
INTRAMUSCULAR | Status: AC
  Filled 2022-11-11: qty 2

## 2022-11-11 MED ORDER — ONDANSETRON HCL 4 MG/2ML IJ SOLN: 4.0000 mg | Freq: Once | INTRAMUSCULAR | Status: DC | PRN

## 2022-11-11 MED ORDER — PROPOFOL 1000 MG/100ML IV EMUL
INTRAVENOUS | Status: AC
  Filled 2022-11-11: qty 100

## 2022-11-11 MED ORDER — 0.9 % SODIUM CHLORIDE (POUR BTL) OPTIME
TOPICAL | Status: DC | PRN
  Administered 2022-11-11: 1000 mL

## 2022-11-11 MED ORDER — DEXAMETHASONE SODIUM PHOSPHATE 10 MG/ML IJ SOLN
INTRAMUSCULAR | Status: DC | PRN
  Administered 2022-11-11: 4 mg via INTRAVENOUS

## 2022-11-11 MED ORDER — OXYCODONE HCL 5 MG PO TABS
10.0000 mg | ORAL_TABLET | ORAL | Status: DC | PRN
  Filled 2022-11-11 (×2): qty 2

## 2022-11-11 MED ORDER — METHOCARBAMOL 1000 MG/10ML IJ SOLN: 500.0000 mg | Freq: Four times a day (QID) | INTRAVENOUS | Status: DC | PRN

## 2022-11-11 MED ORDER — HYDROCHLOROTHIAZIDE 25 MG PO TABS
12.5000 mg | ORAL_TABLET | Freq: Every day | ORAL | Status: DC
  Administered 2022-11-11 – 2022-11-12 (×2): 12.5 mg via ORAL
  Filled 2022-11-11 (×2): qty 1

## 2022-11-11 MED ORDER — OXYCODONE HCL 5 MG PO TABS
5.0000 mg | ORAL_TABLET | ORAL | Status: DC | PRN
  Administered 2022-11-11: 5 mg via ORAL
  Administered 2022-11-11 – 2022-11-12 (×4): 10 mg via ORAL
  Filled 2022-11-11: qty 1
  Filled 2022-11-11 (×2): qty 2

## 2022-11-11 MED ORDER — ACETAMINOPHEN 325 MG PO TABS
325.0000 mg | ORAL_TABLET | Freq: Four times a day (QID) | ORAL | Status: DC | PRN
  Administered 2022-11-11 – 2022-11-12 (×2): 650 mg via ORAL
  Filled 2022-11-11 (×2): qty 2

## 2022-11-11 MED ORDER — MENTHOL 3 MG MT LOZG: 1.0000 | LOZENGE | OROMUCOSAL | Status: DC | PRN

## 2022-11-11 MED ORDER — ONDANSETRON HCL 4 MG/2ML IJ SOLN
INTRAMUSCULAR | Status: AC
  Filled 2022-11-11: qty 2

## 2022-11-11 MED ORDER — VENLAFAXINE HCL ER 75 MG PO CP24
225.0000 mg | ORAL_CAPSULE | Freq: Every day | ORAL | Status: DC
  Administered 2022-11-12: 225 mg via ORAL
  Filled 2022-11-11: qty 3

## 2022-11-11 MED ORDER — MUPIROCIN 2 % EX OINT
TOPICAL_OINTMENT | CUTANEOUS | Status: AC
  Filled 2022-11-11: qty 22

## 2022-11-11 MED ORDER — OXYCODONE HCL 5 MG/5ML PO SOLN: 5.0000 mg | Freq: Once | ORAL | Status: DC | PRN

## 2022-11-11 MED ORDER — ROPIVACAINE HCL 7.5 MG/ML IJ SOLN
INTRAMUSCULAR | Status: DC | PRN
Start: 2022-11-11 — End: 2022-11-11
  Administered 2022-11-11: 20 mL via PERINEURAL

## 2022-11-11 MED ORDER — MUPIROCIN 2 % EX OINT
1.0000 | TOPICAL_OINTMENT | Freq: Two times a day (BID) | CUTANEOUS | Status: DC
  Administered 2022-11-11: 1 via TOPICAL

## 2022-11-11 MED ORDER — ASPIRIN 81 MG PO CHEW
81.0000 mg | CHEWABLE_TABLET | Freq: Two times a day (BID) | ORAL | Status: DC
  Administered 2022-11-11 – 2022-11-12 (×2): 81 mg via ORAL
  Filled 2022-11-11 (×2): qty 1

## 2022-11-11 MED ORDER — ACETAMINOPHEN 500 MG PO TABS
1000.0000 mg | ORAL_TABLET | Freq: Once | ORAL | Status: AC
  Administered 2022-11-11: 1000 mg via ORAL
  Filled 2022-11-11: qty 2

## 2022-11-11 MED ORDER — CHLORHEXIDINE GLUCONATE 0.12 % MT SOLN
OROMUCOSAL | Status: AC
  Administered 2022-11-11: 15 mL via OROMUCOSAL
  Filled 2022-11-11: qty 15

## 2022-11-11 MED ORDER — METOCLOPRAMIDE HCL 5 MG/ML IJ SOLN: 5.0000 mg | Freq: Three times a day (TID) | INTRAMUSCULAR | Status: DC | PRN

## 2022-11-11 SURGICAL SUPPLY — 79 items
BAG COUNTER SPONGE SURGICOUNT (BAG) ×1 IMPLANT
BAG SPNG CNTER NS LX DISP (BAG) ×1
BANDAGE ESMARK 6X9 LF (GAUZE/BANDAGES/DRESSINGS) ×1 IMPLANT
BLADE SAG 18X100X1.27 (BLADE) ×1 IMPLANT
BLADE SAW SGTL 11.0X1.19X90.0M (BLADE) IMPLANT
BNDG CMPR 6 X 5 YARDS HK CLSR (GAUZE/BANDAGES/DRESSINGS) ×1
BNDG CMPR 9X6 STRL LF SNTH (GAUZE/BANDAGES/DRESSINGS) ×1
BNDG ELASTIC 6INX 5YD STR LF (GAUZE/BANDAGES/DRESSINGS) IMPLANT
BNDG ELASTIC 6X5.8 VLCR STR LF (GAUZE/BANDAGES/DRESSINGS) ×2 IMPLANT
BNDG ESMARK 6X9 LF (GAUZE/BANDAGES/DRESSINGS) ×1
BOWL SMART MIX CTS (DISPOSABLE) IMPLANT
COMP FEM PERSONA SZ6 RT (Joint) ×1 IMPLANT
COMP PATELLA 3 PEG 29X9 (Joint) ×1 IMPLANT
COMP TIB PS KNEE 0D E RT (Joint) ×1 IMPLANT
COMPONENT FEM PERSONA SZ6 RT (Joint) IMPLANT
COMPONENT PATELLA 3 PEG 29X9 (Joint) IMPLANT
COMPONET TIB PS KNEE 0D E RT (Joint) IMPLANT
COOLER ICEMAN CLASSIC (MISCELLANEOUS) ×1 IMPLANT
COVER SURGICAL LIGHT HANDLE (MISCELLANEOUS) ×1 IMPLANT
CUFF TOURN SGL QUICK 34 (TOURNIQUET CUFF) ×1
CUFF TOURN SGL QUICK 42 (TOURNIQUET CUFF) IMPLANT
CUFF TRNQT CYL 34X4.125X (TOURNIQUET CUFF) ×1 IMPLANT
DRAPE EXTREMITY T 121X128X90 (DISPOSABLE) ×1 IMPLANT
DRAPE HALF SHEET 40X57 (DRAPES) ×1 IMPLANT
DRAPE U-SHAPE 47X51 STRL (DRAPES) ×1 IMPLANT
DRSG XEROFORM 1X8 (GAUZE/BANDAGES/DRESSINGS) IMPLANT
DURAPREP 26ML APPLICATOR (WOUND CARE) ×1 IMPLANT
ELECT CAUTERY BLADE 6.4 (BLADE) ×1 IMPLANT
ELECT REM PT RETURN 9FT ADLT (ELECTROSURGICAL) ×1
ELECTRODE REM PT RTRN 9FT ADLT (ELECTROSURGICAL) ×1 IMPLANT
FACESHIELD WRAPAROUND (MASK) ×3 IMPLANT
FACESHIELD WRAPAROUND OR TEAM (MASK) ×2 IMPLANT
GAUZE PAD ABD 8X10 STRL (GAUZE/BANDAGES/DRESSINGS) ×1 IMPLANT
GAUZE SPONGE 4X4 12PLY STRL (GAUZE/BANDAGES/DRESSINGS) ×1 IMPLANT
GAUZE XEROFORM 1X8 LF (GAUZE/BANDAGES/DRESSINGS) ×1 IMPLANT
GLOVE BIOGEL PI IND STRL 8 (GLOVE) ×2 IMPLANT
GLOVE ORTHO TXT STRL SZ7.5 (GLOVE) ×1 IMPLANT
GLOVE SURG ORTHO 8.0 STRL STRW (GLOVE) ×1 IMPLANT
GOWN STRL REUS W/ TWL LRG LVL3 (GOWN DISPOSABLE) IMPLANT
GOWN STRL REUS W/ TWL XL LVL3 (GOWN DISPOSABLE) ×2 IMPLANT
GOWN STRL REUS W/TWL LRG LVL3 (GOWN DISPOSABLE)
GOWN STRL REUS W/TWL XL LVL3 (GOWN DISPOSABLE) ×2
HANDPIECE INTERPULSE COAX TIP (DISPOSABLE) ×1
IMMOBILIZER KNEE 22 UNIV (SOFTGOODS) ×1 IMPLANT
INSERT TIB ARTISURF SZ6-7 (Insert) IMPLANT
IV NS 1000ML (IV SOLUTION) ×1
IV NS 1000ML BAXH (IV SOLUTION) ×1 IMPLANT
KIT BASIN OR (CUSTOM PROCEDURE TRAY) ×1 IMPLANT
KIT TURNOVER KIT B (KITS) ×1 IMPLANT
MANIFOLD NEPTUNE II (INSTRUMENTS) ×1 IMPLANT
NDL 18GX1X1/2 (RX/OR ONLY) (NEEDLE) IMPLANT
NDL HYPO 22X1.5 SAFETY MO (MISCELLANEOUS) IMPLANT
NEEDLE 18GX1X1/2 (RX/OR ONLY) (NEEDLE) IMPLANT
NEEDLE HYPO 22X1.5 SAFETY MO (MISCELLANEOUS) ×1 IMPLANT
NS IRRIG 1000ML POUR BTL (IV SOLUTION) ×1 IMPLANT
PACK TOTAL JOINT (CUSTOM PROCEDURE TRAY) ×1 IMPLANT
PAD ABD 8X10 STRL (GAUZE/BANDAGES/DRESSINGS) IMPLANT
PAD ARMBOARD 7.5X6 YLW CONV (MISCELLANEOUS) ×1 IMPLANT
PAD COLD SHLDR WRAP-ON (PAD) ×1 IMPLANT
PADDING CAST COTTON 6X4 STRL (CAST SUPPLIES) ×1 IMPLANT
PIN DRILL HDLS TROCAR 75 4PK (PIN) IMPLANT
SCREW FEMALE HEX FIX 25X2.5 (ORTHOPEDIC DISPOSABLE SUPPLIES) IMPLANT
SET HNDPC FAN SPRY TIP SCT (DISPOSABLE) ×1 IMPLANT
SET PAD KNEE POSITIONER (MISCELLANEOUS) ×1 IMPLANT
STAPLER VISISTAT 35W (STAPLE) ×1 IMPLANT
SUCTION TUBE FRAZIER 10FR DISP (SUCTIONS) ×1 IMPLANT
SUT VIC AB 0 CT1 27 (SUTURE) ×2
SUT VIC AB 0 CT1 27XBRD ANBCTR (SUTURE) ×1 IMPLANT
SUT VIC AB 1 CT1 27 (SUTURE) ×3
SUT VIC AB 1 CT1 27XBRD ANBCTR (SUTURE) ×2 IMPLANT
SUT VIC AB 2-0 CT1 27 (SUTURE) ×4
SUT VIC AB 2-0 CT1 TAPERPNT 27 (SUTURE) ×2 IMPLANT
SYR 50ML LL SCALE MARK (SYRINGE) IMPLANT
SYR CONTROL 10ML LL (SYRINGE) IMPLANT
TOWEL GREEN STERILE (TOWEL DISPOSABLE) ×1 IMPLANT
TOWEL GREEN STERILE FF (TOWEL DISPOSABLE) ×1 IMPLANT
TRAY CATH INTERMITTENT SS 16FR (CATHETERS) IMPLANT
TRAY FOL W/BAG SLVR 16FR STRL (SET/KITS/TRAYS/PACK) IMPLANT
TRAY FOLEY W/BAG SLVR 16FR LF (SET/KITS/TRAYS/PACK) ×1

## 2022-11-11 NOTE — Interval H&P Note (Signed)
History and Physical Interval Note: The patient understands that she is here today for a right total knee replacement to treat her severe right knee arthritis.  There has been no acute or interval change in her status.  The risks and benefits of surgery been discussed in detail and informed consent has been obtained.  The right operative knee has been marked.  11/11/2022 7:04 AM  Sierra Lucas  has presented today for surgery, with the diagnosis of osteoarthritis right knee.  The various methods of treatment have been discussed with the patient and family. After consideration of risks, benefits and other options for treatment, the patient has consented to  Procedure(s): RIGHT TOTAL KNEE ARTHROPLASTY (Right) as a surgical intervention.  The patient's history has been reviewed, patient examined, no change in status, stable for surgery.  I have reviewed the patient's chart and labs.  Questions were answered to the patient's satisfaction.     Kathryne Hitch

## 2022-11-11 NOTE — Op Note (Signed)
Operative Note  Date of operation: 11/11/2022 Preoperative diagnosis: Right knee primary osteoarthritis Postoperative diagnosis: Same  Procedure: Right press-fit total knee arthroplasty  Implants: Biomet/Zimmer persona press-fit knee system Implant Name Type Inv. Item Serial No. Manufacturer Lot No. LRB No. Used Action  COMP PATELLA 3 PEG 29X9 - [id number] Joint COMP PATELLA 3 PEG 29X9  ZIMMER [employer]ON(ORTH,TRAU,BIO,SG) [id number] Right 1 Implanted  INSERT TIB ARTISURF SZ6-7 - [id number] Insert INSERT TIB ARTISURF SZ6-7  ZIMMER [employer]ON(ORTH,TRAU,BIO,SG) [id number] Right 1 Implanted  COMP FEM PERSONA SZ6 RT - [id number] Joint COMP FEM PERSONA SZ6 RT  ZIMMER RECON(ORTH,TRAU,BIO,SG) [id number] Right 1 Implanted  COMP TIB PS KNEE 0D E RT - [id number] Joint COMP TIB PS KNEE 0D E RT  ZIMMER RECON(ORTH,TRAU,BIO,SG) [id number] Right 1 Implanted   Surgeon: Vanita Panda. Magnus Ivan, MD Assistant: Rexene Edison, PA-C  Anesthesia: #1 right lower extremity adductor canal block, #2 spinal, #3 local Tourniquet time: Under 1 hour EBL: Less than 100 cc Antibiotics: IV Ancef Complications: None  Indications: The patient is a 67 year old female with debilitating arthritis involving her right knee.  Her x-rays show severe arthritis with valgus malalignment.  She has tried and failed conservative treatment for over a year now.  At this point her right knee pain is daily and it is definitely affecting her mobility, her quality of life and her actives daily living to the point she does wish to proceed with a knee replacement and we have recommended this as well.  We did describe the risks of acute blood loss anemia, nerve vessel injury, fracture, infection, DVT and implant failure.  She understands that our goals are hopefully decrease pain, improve mobility, and improve quality of life.  Procedure description: After informed consent was obtained and the appropriate right knee was marked, anesthesia obtained a right lower  extremity adductor canal block in the holding room and then the patient was brought to the operating room and set up on the operating table where spinal anesthesia was obtained.  She was laid in a supine position on the operating table and a Foley catheter was placed.  A tourniquet was then placed on her upper right thigh and her right thigh, knee, leg and ankle as well as foot were prepped and draped with DuraPrep and sterile drapes including a sterile stockinette.  A timeout was called and she was identified as the correct patient and the correct right knee.  An Esmarch was then used to wrap out the leg and the tourniquet was inflated to 300 mm of pressure.  With the knee extended a direct midline incision was made over the patella and carried proximally distally.  Dissection was carried down to the knee joint and a medial parapatellar arthrotomy was made finding a large joint effusion.  We did find osteophytes in all 3 compartments that are removed.  We removed remnants of the ACL as well as medial lateral meniscus.  With the knee in a flexed position we placed an extramedullary cutting guide for proximal tibia cut correction for varus and valgus and a 7 degree slope.  We made this cut to take 2 mm off the low side and we did back down to more millimeters.  We then used a intramedullary cutting guide for distal femur cut doing this for right knee 5 degrees externally rotated and a 10 mm distal femoral cut.  We made the cut without difficulty and brought the knee back down to full extension and achieved full extension with a 10 mm extension block.  Attention was then turned back to the femur.  The femoral sizing guide was placed off the epicondylar axis.  Based off of this we chose a size 6 femur.  A 4-in-1 cutting block was placed for a size 6 femur and we made our anterior and posterior cuts followed our chamfer cuts.  We then backed the tibia and chose a size E right tibial tray for coverage over the tibial  plateau setting the rotation of the tibial tubercle and femur.  We did our drill punch and keel hole off of this we found good quality bone for press-fit implants.  She is 89 and is not osteoporotic and does not take osteoporosis medications.  We then trialed our size E right tibia followed by our size 6 right standard CR femur.  We placed a 10 mm medial congruent right polythene insert and went up to a 12 mm insert we are pleased with range of motion and stability without insert.  We then made a patella cut and drilled 3 holes for press-fit size 29 patella button.  With all trial instrumentation the knee reportedly several cycles of motion and we are pleased with range of motion and stability.  We then removed all trial instrumentation the knee and irrigated knee with normal saline solution.  We then placed Marcaine with epinephrine around the arthrotomy.  Next with the knee in a flexed position we placed our Biomet/Zimmer persona press-fit tibial tray for right knee size E followed by press fitting our size 6 right CR standard femur.  We placed our 12 mm thickness medial congruent right polythene insert and press-fit our size 29 patella button.  Again we are pleased with range of motion and stability with his implants.  The tourniquet was let down hemostasis obtained with electrocautery.  The arthrotomy was closed with interrupted #1 Vicryl suture followed by 0 Vicryl for the deep tissue and 2-0 Vicryl to close the subcutaneous tissue.  The skin was closed with staples.  Well-padded sterile dressings applied.  The patient was taken recovery room in stable condition.  Rexene Edison, PA-C did assist during the entire case and beginning the end his assistance was crucial medically necessary for soft tissue management and retraction, helping guide implant placement and a layered closure of the wound.

## 2022-11-11 NOTE — Evaluation (Signed)
Physical Therapy Evaluation Patient Details Name: Sierra Lucas MRN: [id number] DOB: 1955-08-01 Today's Date: 11/11/2022  History of Present Illness  67 yo female admitted 10/1 for Rt TKA. PMhx: CAD, HLD  Clinical Impression  Pt pleasant and eager to get OOB. Pt lives with spouse, is normally independent and enjoys walking but has been limited for 4 years due to Rt knee pain. Pt with good strength able to perform SLR and knee flexion well. Pt educated for transfers, gait, HEP with handout provided. Will continue to follow acutely to maximize ROM , strength and gait prior to D/c.         If plan is discharge home, recommend the following: A lot of help with bathing/dressing/bathroom;Assist for transportation   Can travel by private vehicle        Equipment Recommendations None recommended by PT  Recommendations for Other Services       Functional Status Assessment Patient has had a recent decline in their functional status and demonstrates the ability to make significant improvements in function in a reasonable and predictable amount of time.     Precautions / Restrictions Precautions Precautions: Knee Restrictions Weight Bearing Restrictions: Yes RLE Weight Bearing: Weight bearing as tolerated      Mobility  Bed Mobility Overal bed mobility: Modified Independent             General bed mobility comments: pt able to transition supine<>sit with assist only for lines, no physical assist required, HOB 20 degrees    Transfers Overall transfer level: Needs assistance   Transfers: Sit to/from Stand, Bed to chair/wheelchair/BSC Sit to Stand: Supervision Stand pivot transfers: Supervision         General transfer comment: supervision with cues for hand and Rt foot placement to rise from bed and recliner, pivot recliner to bed    Ambulation/Gait Ambulation/Gait assistance: Supervision Gait Distance (Feet): 200 Feet Assistive device: Rolling walker (2  wheels) Gait Pattern/deviations: Step-through pattern, Decreased stride length, Decreased stance time - right   Gait velocity interpretation: 1.31 - 2.62 ft/sec, indicative of limited community ambulator   General Gait Details: decreased rt knee flexion and swing thru with cues for heel strike, limited circumduction at times with cues for RW use and progression. no buckling with gait  Stairs            Wheelchair Mobility     Tilt Bed    Modified Rankin (Stroke Patients Only)       Balance Overall balance assessment: No apparent balance deficits (not formally assessed)                                           Pertinent Vitals/Pain Pain Assessment Pain Assessment: 0-10 Pain Score: 7  Pain Location: Rt knee Pain Descriptors / Indicators: Aching, Guarding Pain Intervention(s): Limited activity within patient's tolerance, Repositioned, Monitored during session, Premedicated before session, RN gave pain meds during session, Ice applied    Home Living Family/patient expects to be discharged to:: Private residence Living Arrangements: Spouse/significant other Available Help at Discharge: Family;Available 24 hours/day Type of Home: House Home Access: Level entry       Home Layout: Two level;Able to live on main level with bedroom/bathroom Home Equipment: Rolling Walker (2 wheels);Cane - single point      Prior Function Prior Level of Function : Independent/Modified Independent  Extremity/Trunk Assessment   Upper Extremity Assessment Upper Extremity Assessment: Overall WFL for tasks assessed    Lower Extremity Assessment Lower Extremity Assessment: RLE deficits/detail RLE Deficits / Details: decreased ROM and strenght post op    Cervical / Trunk Assessment Cervical / Trunk Assessment: Normal  Communication   Communication Communication: No apparent difficulties  Cognition Arousal: Alert Behavior During Therapy:  WFL for tasks assessed/performed Overall Cognitive Status: Within Functional Limits for tasks assessed                                          General Comments      Exercises Total Joint Exercises Heel Slides: AAROM, Right, Supine, 5 reps Hip ABduction/ADduction: AROM, Right, Supine, 5 reps Straight Leg Raises: AROM, Right, Supine, 5 reps Long Arc Quad: AROM, Right, Seated, 5 reps   Assessment/Plan    PT Assessment Patient needs continued PT services  PT Problem List Decreased range of motion;Decreased activity tolerance;Decreased balance;Pain;Decreased mobility       PT Treatment Interventions DME instruction;Gait training;Stair training;Functional mobility training;Therapeutic activities;Patient/family education;Therapeutic exercise    PT Goals (Current goals can be found in the Care Plan section)  Acute Rehab PT Goals Patient Stated Goal: return to walking for exercise PT Goal Formulation: With patient Time For Goal Achievement: 11/18/22 Potential to Achieve Goals: Good    Frequency 7X/week     Co-evaluation               AM-PAC PT "6 Clicks" Mobility  Outcome Measure Help needed turning from your back to your side while in a flat bed without using bedrails?: None Help needed moving from lying on your back to sitting on the side of a flat bed without using bedrails?: None Help needed moving to and from a bed to a chair (including a wheelchair)?: A Little Help needed standing up from a chair using your arms (e.g., wheelchair or bedside chair)?: A Little Help needed to walk in hospital room?: A Little Help needed climbing 3-5 steps with a railing? : A Little 6 Click Score: 20    End of Session Equipment Utilized During Treatment: Gait belt Activity Tolerance: Patient tolerated treatment well Patient left: in bed;with call bell/phone within reach;with nursing/sitter in room Nurse Communication: Mobility status;Precautions;Weight bearing  status PT Visit Diagnosis: Other abnormalities of gait and mobility (R26.89)    Time: 4098-1191 PT Time Calculation (min) (ACUTE ONLY): 27 min   Charges:   PT Evaluation $PT Eval Moderate Complexity: 1 Mod PT Treatments $Gait Training: 8-22 mins PT General Charges $$ ACUTE PT VISIT: 1 Visit         Merryl Hacker, PT Acute Rehabilitation Services Office: [phone]   Enedina Finner Ronrico Dupin 11/11/2022, 1:54 PM

## 2022-11-11 NOTE — Anesthesia Procedure Notes (Signed)
Spinal  Patient location during procedure: OR Start time: 11/11/2022 7:34 AM End time: 11/11/2022 7:37 AM Reason for block: surgical anesthesia Staffing Performed: anesthesiologist  Anesthesiologist: Beryle Lathe, MD Performed by: Beryle Lathe, MD Authorized by: Beryle Lathe, MD   Preanesthetic Checklist Completed: patient identified, IV checked, risks and benefits discussed, surgical consent, monitors and equipment checked, pre-op evaluation and timeout performed Spinal Block Patient position: sitting Prep: DuraPrep Patient monitoring: heart rate, cardiac monitor, continuous pulse ox and blood pressure Approach: midline Location: L2-3 Injection technique: single-shot Needle Needle type: Pencan  Needle gauge: 24 G Additional Notes Consent was obtained prior to the procedure with all questions answered and concerns addressed. Risks including, but not limited to, bleeding, infection, nerve damage, paralysis, failed block, inadequate analgesia, allergic reaction, high spinal, itching, and headache were discussed and the patient wished to proceed. Functioning IV was confirmed and monitors were applied. Sterile prep and drape, including hand hygiene, mask, and sterile gloves were used. The patient was positioned and the spine was prepped. The skin was anesthetized with lidocaine. Free flow of clear CSF was obtained prior to injecting local anesthetic into the CSF. The spinal needle aspirated freely following injection. The needle was carefully withdrawn. The patient tolerated the procedure well.   Leslye Peer, MD

## 2022-11-11 NOTE — Plan of Care (Signed)
  Problem: Skin Integrity: Goal: Risk for impaired skin integrity will decrease Outcome: Completed/Met   Problem: Education: Goal: Knowledge of the prescribed therapeutic regimen will improve Outcome: Completed/Met Goal: Individualized Educational Video(s) Outcome: Completed/Met   Problem: Activity: Goal: Ability to avoid complications of mobility impairment will improve Outcome: Completed/Met Goal: Range of joint motion will improve Outcome: Completed/Met   Problem: Clinical Measurements: Goal: Postoperative complications will be avoided or minimized Outcome: Completed/Met   Problem: Pain Management: Goal: Pain level will decrease with appropriate interventions Outcome: Completed/Met   Problem: Skin Integrity: Goal: Will show signs of wound healing Outcome: Completed/Met

## 2022-11-11 NOTE — Anesthesia Postprocedure Evaluation (Signed)
Anesthesia Post Note  Patient: Sierra Lucas  Procedure(s) Performed: RIGHT TOTAL KNEE ARTHROPLASTY (Right: Knee)     Patient location during evaluation: PACU Anesthesia Type: Spinal Level of consciousness: awake and alert Pain management: pain level controlled Vital Signs Assessment: post-procedure vital signs reviewed and stable Respiratory status: spontaneous breathing and respiratory function stable Cardiovascular status: blood pressure returned to baseline and stable Postop Assessment: spinal receding and no apparent nausea or vomiting Anesthetic complications: no   No notable events documented.  Last Vitals:  Vitals:   11/11/22 1025 11/11/22 1025  BP: 127/77 127/77  Pulse: 72 71  Resp: 18 18  Temp: 36.6 C 36.6 C  SpO2: 99% 98%    Last Pain:  Vitals:   11/11/22 1110  TempSrc:   PainSc: 6                  Beryle Lathe

## 2022-11-11 NOTE — Transfer of Care (Signed)
Immediate Anesthesia Transfer of Care Note  Patient: Sierra Lucas  Procedure(s) Performed: RIGHT TOTAL KNEE ARTHROPLASTY (Right: Knee)  Patient Location: PACU  Anesthesia Type:Spinal  Level of Consciousness: awake, alert , and oriented  Airway & Oxygen Therapy: Patient Spontanous Breathing  Post-op Assessment: Report given to RN and Post -op Vital signs reviewed and stable  Post vital signs: Reviewed and stable  Last Vitals:  Vitals Value Taken Time  BP 127/77 11/11/22 1025  Temp 36.6 C 11/11/22 1025  Pulse 71 11/11/22 1025  Resp 18 11/11/22 1025  SpO2 98 % 11/11/22 1025    Last Pain:  Vitals:   11/11/22 1000  TempSrc:   PainSc: 0-No pain         Complications: No notable events documented.

## 2022-11-11 NOTE — Anesthesia Procedure Notes (Signed)
Anesthesia Regional Block: Adductor canal block   Pre-Anesthetic Checklist: , timeout performed,  Correct Patient, Correct Site, Correct Laterality,  Correct Procedure, Correct Position, site marked,  Risks and benefits discussed,  Surgical consent,  Pre-op evaluation,  At surgeon's request and post-op pain management  Laterality: Right  Prep: chloraprep       Needles:  Injection technique: Single-shot  Needle Type: Echogenic Needle     Needle Length: 10cm  Needle Gauge: 21     Additional Needles:   Narrative:  Start time: 11/11/2022 7:18 AM End time: 11/11/2022 7:21 AM Injection made incrementally with aspirations every 5 mL.  Performed by: Personally  Anesthesiologist: Beryle Lathe, MD  Additional Notes: No pain on injection. No increased resistance to injection. Injection made in 5cc increments. Good needle visualization. Patient tolerated the procedure well.

## 2022-11-12 ENCOUNTER — Encounter (HOSPITAL_COMMUNITY): Payer: Self-pay | Admitting: Orthopaedic Surgery

## 2022-11-12 ENCOUNTER — Other Ambulatory Visit: Payer: Self-pay | Admitting: *Deleted

## 2022-11-12 DIAGNOSIS — Z7982 Long term (current) use of aspirin: Secondary | ICD-10-CM | POA: Diagnosis not present

## 2022-11-12 DIAGNOSIS — I2510 Atherosclerotic heart disease of native coronary artery without angina pectoris: Secondary | ICD-10-CM | POA: Diagnosis not present

## 2022-11-12 DIAGNOSIS — M1711 Unilateral primary osteoarthritis, right knee: Secondary | ICD-10-CM

## 2022-11-12 DIAGNOSIS — Z91040 Latex allergy status: Secondary | ICD-10-CM | POA: Diagnosis not present

## 2022-11-12 DIAGNOSIS — Z96651 Presence of right artificial knee joint: Secondary | ICD-10-CM

## 2022-11-12 DIAGNOSIS — Z79899 Other long term (current) drug therapy: Secondary | ICD-10-CM | POA: Diagnosis not present

## 2022-11-12 LAB — BASIC METABOLIC PANEL
Anion gap: 11 (ref 5–15)
BUN: 13 mg/dL (ref 8–23)
CO2: 26 mmol/L (ref 22–32)
Calcium: 8.3 mg/dL — ABNORMAL LOW (ref 8.9–10.3)
Chloride: 92 mmol/L — ABNORMAL LOW (ref 98–111)
Creatinine, Ser: 0.7 mg/dL (ref 0.44–1.00)
GFR, Estimated: >60 mL/min (ref >60)
Glucose, Bld: 142 mg/dL — ABNORMAL HIGH (ref 70–99)
Potassium: 3.2 mmol/L — ABNORMAL LOW (ref 3.5–5.1)
Sodium: 129 mmol/L — ABNORMAL LOW (ref 135–145)

## 2022-11-12 LAB — CBC
HCT: 35.8 % — ABNORMAL LOW (ref 36.0–46.0)
Hemoglobin: 12 g/dL (ref 12.0–15.0)
MCH: 28.9 pg (ref 26.0–34.0)
MCHC: 33.5 g/dL (ref 30.0–36.0)
MCV: 86.3 fL (ref 80.0–100.0)
Platelets: 225 10*3/uL (ref 150–400)
RBC: 4.15 MIL/uL (ref 3.87–5.11)
RDW: 13.2 % (ref 11.5–15.5)
WBC: 13.3 10*3/uL — ABNORMAL HIGH (ref 4.0–10.5)
nRBC: 0 % (ref 0.0–0.2)

## 2022-11-12 MED ORDER — GABAPENTIN 100 MG PO CAPS: 100.0000 mg | ORAL_CAPSULE | Freq: Three times a day (TID) | ORAL | 0 refills | Status: AC | PRN

## 2022-11-12 MED ORDER — ASPIRIN 81 MG PO CHEW: 81.0000 mg | CHEWABLE_TABLET | Freq: Two times a day (BID) | ORAL | 0 refills | Status: DC

## 2022-11-12 MED ORDER — TIZANIDINE HCL 4 MG PO TABS: 4.0000 mg | ORAL_TABLET | Freq: Four times a day (QID) | ORAL | 0 refills | Status: DC | PRN

## 2022-11-12 MED ORDER — OXYCODONE HCL 5 MG PO TABS: 5.0000 mg | ORAL_TABLET | Freq: Four times a day (QID) | ORAL | 0 refills | Status: DC | PRN

## 2022-11-12 NOTE — Care Plan (Signed)
Ortho Bundle Case Management Note  Patient Details  Name: Sierra Lucas MRN: [id number] Date of Birth: 06-13-1955    [facility] RNCM call to patient to discuss her upcoming Right total knee arthroplasty with Dr. Magnus Ivan. She is an Ortho bundle patient and is agreeable to case management. She plans to return home with assistance from her husband. She has a RW and cane already. Don't anticipate any other DME. Anticipate HHPT will be needed after a short hospital stay. Referral made to [facility] after choice provided. Reviewed all post op care instructions. Will continue to follow for needs.                        DME Arranged:   (Patient already has a RW in her home.) DME Agency:     HH Arranged:  PT HH Agency:  [facility]  Additional Comments: Please contact me with any questions of if this plan should need to change.  Ralph Dowdy, RN, BSN, [employer]  [phone] 11/12/2022, 8:58 AM

## 2022-11-12 NOTE — Discharge Instructions (Signed)

## 2022-11-12 NOTE — Care Management Obs Status (Signed)
MEDICARE OBSERVATION STATUS NOTIFICATION   Patient Details  Name: Sierra Lucas MRN: [id number] Date of Birth: 1955-10-07   Medicare Observation Status Notification Given:  Yes    Lawerance Sabal, RN 11/12/2022, 7:35 AM

## 2022-11-12 NOTE — Evaluation (Signed)
Occupational Therapy Evaluation Patient Details Name: Sierra Lucas MRN: [id number] DOB: 29-May-1955 Today's Date: 11/12/2022   History of Present Illness 67 yo female admitted 10/1 for Rt TKA. PMhx: CAD, HLD   Clinical Impression   PTA patient independent and driving. Admitted for above and presents with problem list below, including R knee pain and decreased activity tolerance.  She requires up to min assist for LB Adls, supervision for transfers and mobility using RW in room.  Discussed compensatory techniques, recommendations, DME, safety and activity tolerance, as well as fall prevention.  Based on performance today, no further OT needs recommended and pt with no further question or concerns.  OT will sign off. Thank you for this referral!       If plan is discharge home, recommend the following: A little help with walking and/or transfers;A little help with bathing/dressing/bathroom;Assistance with cooking/housework;Assist for transportation;Help with stairs or ramp for entrance    Functional Status Assessment  Patient has had a recent decline in their functional status and demonstrates the ability to make significant improvements in function in a reasonable and predictable amount of time.  Equipment Recommendations  None recommended by OT    Recommendations for Other Services       Precautions / Restrictions Precautions Precautions: Knee Precaution Booklet Issued: Yes (comment) Precaution Comments: reviewed precautions Restrictions Weight Bearing Restrictions: Yes RLE Weight Bearing: Weight bearing as tolerated      Mobility Bed Mobility Overal bed mobility: Modified Independent                  Transfers Overall transfer level: Needs assistance Equipment used: Rolling walker (2 wheels) Transfers: Sit to/from Stand Sit to Stand: Supervision           General transfer comment: safety due to pain      Balance Overall balance assessment: Needs  assistance Sitting-balance support: No upper extremity supported Sitting balance-Leahy Scale: Good     Standing balance support: Single extremity supported Standing balance-Leahy Scale: Poor Standing balance comment: relies on RW, able to stand without UE support briefly during ADLs                           ADL either performed or assessed with clinical judgement   ADL Overall ADL's : Needs assistance/impaired     Grooming: Supervision/safety;Standing           Upper Body Dressing : Supervision/safety;Standing   Lower Body Dressing: Minimal assistance;Sit to/from stand;Cueing for compensatory techniques Lower Body Dressing Details (indicate cue type and reason): pt needing assist minimally for clothing on R LE, but educated on compensatory techniques and recommendations Toilet Transfer: Supervision/safety;Rolling walker (2 wheels)         Tub/Shower Transfer Details (indicate cue type and reason): simulated, discussed several techniques but pt will have support of spouse at dc Functional mobility during ADLs: Supervision/safety;Rolling walker (2 wheels)       Vision   Vision Assessment?: No apparent visual deficits     Perception         Praxis         Pertinent Vitals/Pain Pain Assessment Pain Assessment: Faces Faces Pain Scale: Hurts even more Pain Location: R knee Pain Descriptors / Indicators: Sore Pain Intervention(s): Limited activity within patient's tolerance, Monitored during session, Premedicated before session, Repositioned     Extremity/Trunk Assessment Upper Extremity Assessment Upper Extremity Assessment: Overall WFL for tasks assessed   Lower Extremity Assessment Lower Extremity Assessment:  Defer to PT evaluation       Communication Communication Communication: No apparent difficulties   Cognition Arousal: Alert Behavior During Therapy: WFL for tasks assessed/performed Overall Cognitive Status: Within Functional Limits  for tasks assessed                                       General Comments  VSS on RA    Exercises     Shoulder Instructions      Home Living Family/patient expects to be discharged to:: Private residence Living Arrangements: Spouse/significant other Available Help at Discharge: Family;Available 24 hours/day Type of Home: House Home Access: Level entry     Home Layout: Two level;Able to live on main level with bedroom/bathroom     Bathroom Shower/Tub: Tub/shower unit   Bathroom Toilet: Handicapped height     Home Equipment: Agricultural consultant (2 wheels);Cane - single point          Prior Functioning/Environment Prior Level of Function : Independent/Modified Independent                        OT Problem List: Decreased activity tolerance;Pain      OT Treatment/Interventions:      OT Goals(Current goals can be found in the care plan section) Acute Rehab OT Goals Patient Stated Goal: home OT Goal Formulation: With patient  OT Frequency:      Co-evaluation              AM-PAC OT "6 Clicks" Daily Activity     Outcome Measure Help from another person eating meals?: None Help from another person taking care of personal grooming?: None Help from another person toileting, which includes using toliet, bedpan, or urinal?: A Little Help from another person bathing (including washing, rinsing, drying)?: A Little Help from another person to put on and taking off regular upper body clothing?: None Help from another person to put on and taking off regular lower body clothing?: A Little 6 Click Score: 21   End of Session Equipment Utilized During Treatment: Rolling walker (2 wheels) Nurse Communication: Mobility status  Activity Tolerance: Patient tolerated treatment well Patient left: in bed;with call bell/phone within reach  OT Visit Diagnosis: Other abnormalities of gait and mobility (R26.89);Pain Pain - Right/Left: Right Pain - part of  body: Knee                Time: 8119-1478 OT Time Calculation (min): 22 min Charges:  OT General Charges $OT Visit: 1 Visit OT Evaluation $OT Eval Low Complexity: 1 Low  Barry Brunner, OT Acute Rehabilitation Services Office [phone]   Chancy Milroy 11/12/2022, 10:09 AM

## 2022-11-12 NOTE — Progress Notes (Signed)
Physical Therapy Treatment Patient Details Name: Sierra Lucas MRN: [id number] DOB: 23-Jan-1956 Today's Date: 11/12/2022   History of Present Illness 67 yo female admitted 10/1 for Rt TKA. PMhx: CAD, HLD    PT Comments  Pt tolerates treatment well, ambulating for household distances with support of RW. Pt denies concerns about HEP and has been performing exercises since initial evaluation yesterday. PT continues to encourage frequent mobilization at the time of discharge, along with reinforcement of the need for knee extension when resting. PT anticipates the pt will continue to progress well.    If plan is discharge home, recommend the following: A little help with bathing/dressing/bathroom;Assist for transportation   Can travel by private vehicle        Equipment Recommendations  None recommended by PT    Recommendations for Other Services       Precautions / Restrictions Precautions Precautions: Knee Precaution Booklet Issued: Yes (comment) Restrictions Weight Bearing Restrictions: Yes RLE Weight Bearing: Weight bearing as tolerated     Mobility  Bed Mobility Overal bed mobility: Modified Independent                  Transfers Overall transfer level: Modified independent Equipment used: Rolling walker (2 wheels) Transfers: Sit to/from Stand Sit to Stand: Modified independent (Device/Increase time)                Ambulation/Gait Ambulation/Gait assistance: Supervision Gait Distance (Feet): 150 Feet Assistive device: Rolling walker (2 wheels) Gait Pattern/deviations: Step-through pattern Gait velocity: reduced Gait velocity interpretation: <1.8 ft/sec, indicate of risk for recurrent falls   General Gait Details: slowed step-through gait, reduced stance time on RLE   Stairs             Wheelchair Mobility     Tilt Bed    Modified Rankin (Stroke Patients Only)       Balance Overall balance assessment: Needs  assistance Sitting-balance support: No upper extremity supported Sitting balance-Leahy Scale: Good     Standing balance support: Single extremity supported Standing balance-Leahy Scale: Poor                              Cognition Arousal: Alert Behavior During Therapy: WFL for tasks assessed/performed Overall Cognitive Status: Within Functional Limits for tasks assessed                                          Exercises Total Joint Exercises Goniometric ROM: -4 knee extension, 75 degrees knee flexion Other Exercises Other Exercises: pt denies concerns about HEP at this time    General Comments General comments (skin integrity, edema, etc.): VSS on RA      Pertinent Vitals/Pain Pain Assessment Pain Assessment: 0-10 Pain Score: 8  Pain Location: R knee Pain Descriptors / Indicators: Sore Pain Intervention(s): RN gave pain meds during session    Home Living                          Prior Function            PT Goals (current goals can now be found in the care plan section) Acute Rehab PT Goals Patient Stated Goal: return to walking for exercise Progress towards PT goals: Progressing toward goals    Frequency    Min 1X/week  PT Plan      Co-evaluation              AM-PAC PT "6 Clicks" Mobility   Outcome Measure  Help needed turning from your back to your side while in a flat bed without using bedrails?: None Help needed moving from lying on your back to sitting on the side of a flat bed without using bedrails?: None Help needed moving to and from a bed to a chair (including a wheelchair)?: None Help needed standing up from a chair using your arms (e.g., wheelchair or bedside chair)?: None Help needed to walk in hospital room?: A Little Help needed climbing 3-5 steps with a railing? : A Little 6 Click Score: 22    End of Session   Activity Tolerance: Patient tolerated treatment well Patient left: in  bed;with call bell/phone within reach Nurse Communication: Mobility status PT Visit Diagnosis: Other abnormalities of gait and mobility (R26.89)     Time: 1610-9604 PT Time Calculation (min) (ACUTE ONLY): 14 min  Charges:    $Gait Training: 8-22 mins PT General Charges $$ ACUTE PT VISIT: 1 Visit                     Arlyss Gandy, PT, DPT Acute Rehabilitation Office [phone]    Arlyss Gandy 11/12/2022, 9:12 AM

## 2022-11-12 NOTE — Progress Notes (Signed)
Patient alert and oriented, voiding adequately, skin clean, dry and intact without evidence of skin break down, or symptoms of complications - no redness or edema noted, only slight tenderness at site.  Patient states pain is manageable at time of discharge. Patient has an appointment with MD in 2 weeks 

## 2022-11-12 NOTE — Progress Notes (Signed)
Subjective: 1 Day Post-Op Procedure(s) (LRB): RIGHT TOTAL KNEE ARTHROPLASTY (Right) Patient reports pain as moderate.    Objective: Vital signs in last 24 hours: Temp:  [97.3 F (36.3 C)-98.5 F (36.9 C)] 98.1 F (36.7 C) (10/02 0337) Pulse Rate:  [71-98] 87 (10/02 0337) Resp:  [15-20] 19 (10/02 0337) BP: (105-151)/(65-87) 118/75 (10/02 0337) SpO2:  [97 %-100 %] 99 % (10/02 0337)  Intake/Output from previous day: 10/01 0701 - 10/02 0700 In: 1680 [P.O.:1080; I.V.:600] Out: 1445 [Urine:1370; Blood:75] Intake/Output this shift: No intake/output data recorded.  No results for input(s): "HGB" in the last 72 hours. No results for input(s): "WBC", "RBC", "HCT", "PLT" in the last 72 hours. No results for input(s): "NA", "K", "CL", "CO2", "BUN", "CREATININE", "GLUCOSE", "CALCIUM" in the last 72 hours. No results for input(s): "LABPT", "INR" in the last 72 hours.  Sensation intact distally Intact pulses distally Dorsiflexion/Plantar flexion intact Incision: dressing C/D/I Compartment soft   Assessment/Plan: 1 Day Post-Op Procedure(s) (LRB): RIGHT TOTAL KNEE ARTHROPLASTY (Right) Up with therapy Discharge home with home health      Kathryne Hitch 11/12/2022, 7:59 AM

## 2022-11-12 NOTE — Discharge Summary (Signed)
Patient ID: Sierra Lucas MRN: [id number] DOB/AGE: 1955/12/30 67 y.o.  Admit date: 11/11/2022 Discharge date: 11/12/2022  Admission Diagnoses:  Principal Problem:   Unilateral primary osteoarthritis, right knee Active Problems:   OA (osteoarthritis) of knee   Status post total right knee replacement   Discharge Diagnoses:  Same  Past Medical History:  Diagnosis Date   Abnormal Pap smear 06/19/2005   ASCUS    Anemia    iron deficiency   Anxiety    Coronary artery disease    Fibromyalgia    GI (gastrointestinal bleed) 01/08/2017   Headache(784.0)    Heart murmur    since birth   HGSIL (high grade squamous intraepithelial dysplasia) 2005   Osteoarthritis of right knee    Systemic hypertension 10/11/2013   PATIENT DENIES    Surgeries: Procedure(s): RIGHT TOTAL KNEE ARTHROPLASTY on 11/11/2022   Consultants:   Discharged Condition: Improved  Hospital Course: Sierra Lucas is an 67 y.o. female who was admitted 11/11/2022 for operative treatment ofUnilateral primary osteoarthritis, right knee. Patient has severe unremitting pain that affects sleep, daily activities, and work/hobbies. After pre-op clearance the patient was taken to the operating room on 11/11/2022 and underwent  Procedure(s): RIGHT TOTAL KNEE ARTHROPLASTY.    Patient was given perioperative antibiotics:  Anti-infectives (From admission, onward)    Start     Dose/Rate Route Frequency Ordered Stop   11/11/22 1400  ceFAZolin (ANCEF) IVPB 1 g/50 mL premix        1 g 100 mL/hr over 30 Minutes Intravenous Every 6 hours 11/11/22 1001 11/12/22 1111   11/11/22 0600  ceFAZolin (ANCEF) IVPB 2g/100 mL premix        2 g 200 mL/hr over 30 Minutes Intravenous On call to O.R. 11/11/22 8469 11/11/22 0743        Patient was given sequential compression devices, early ambulation, and chemoprophylaxis to prevent DVT.  Patient benefited maximally from hospital stay and there were no complications.    Recent vital  signs: Patient Vitals for the past 24 hrs:  BP Temp Temp src Pulse Resp SpO2  11/12/22 0810 (!) 141/74 -- -- 90 16 100 %  11/12/22 0337 118/75 98.1 F (36.7 C) Oral 87 19 99 %  11/11/22 2225 138/70 98.1 F (36.7 C) Oral 93 20 97 %  11/11/22 1926 128/80 98.5 F (36.9 C) Oral 98 20 98 %  11/11/22 1548 (!) 151/87 (!) 97.5 F (36.4 C) -- 92 16 100 %     Recent laboratory studies:  Recent Labs    11/12/22 0730  WBC 13.3*  HGB 12.0  HCT 35.8*  PLT 225  NA 129*  K 3.2*  CL 92*  CO2 26  BUN 13  CREATININE 0.70  GLUCOSE 142*  CALCIUM 8.3*     Discharge Medications:   Allergies as of 11/12/2022       Reactions   Iohexol Itching    pt had prior contrast yrs ago W/ itching.   50 mg benadryl po 15 mins before scan.  she still had itching of mouth, lips, throat and breast. needs po benadryl 1 hr prior per dr. Denny Levy, Onset Date: [id number]   Latex    Morphine Other (See Comments)   hallucinations   Rosuvastatin    Muscle pain on 10mg  3x/week and daily dosing   Simvastatin    Muscle pain   Sulfa Antibiotics         Medication List     STOP taking these medications  aspirin 81 MG tablet Replaced by: aspirin 81 MG chewable tablet       TAKE these medications    ALPRAZolam 0.5 MG tablet Commonly known as: XANAX Take 0.25 mg by mouth daily as needed for anxiety (flying).   aspirin 81 MG chewable tablet Chew 1 tablet (81 mg total) by mouth 2 (two) times daily. Replaces: aspirin 81 MG tablet   Biotin 1 MG Caps Take 1 mg by mouth daily.   cetirizine 10 MG tablet Commonly known as: ZYRTEC Take 10 mg by mouth daily.   EYE ALLERGY RELIEF OP Place 1 drop into both eyes daily as needed (Itiching and burning).   ezetimibe 10 MG tablet Commonly known as: ZETIA Take 1 tablet (10 mg total) by mouth daily.   gabapentin 100 MG capsule Commonly known as: Neurontin Take 1 capsule (100 mg total) by mouth 3 (three) times daily as needed (burning pain).    hydrochlorothiazide 25 MG tablet Commonly known as: HYDRODIURIL Take 25 mg by mouth daily.   multivitamin with minerals tablet Take 1 tablet by mouth daily. Woman 50+   oxyCODONE 5 MG immediate release tablet Commonly known as: Oxy IR/ROXICODONE Take 1-2 tablets (5-10 mg total) by mouth every 6 (six) hours as needed for moderate pain (pain score 4-6).   Ozempic (1 MG/DOSE) 2 MG/1.5ML Sopn Generic drug: Semaglutide (1 MG/DOSE) Inject 1.5 mg into the skin once a week.   pantoprazole 40 MG tablet Commonly known as: PROTONIX Take 40 mg by mouth daily.   Repatha SureClick 140 MG/ML Soaj Generic drug: Evolocumab Inject 140 mg into the skin every 14 (fourteen) days.   tiZANidine 4 MG tablet Commonly known as: Zanaflex Take 1 tablet (4 mg total) by mouth every 6 (six) hours as needed for muscle spasms.   traZODone 50 MG tablet Commonly known as: DESYREL Take 50 mg by mouth at bedtime.   venlafaxine XR 75 MG 24 hr capsule Commonly known as: EFFEXOR-XR Take 75 mg by mouth daily with breakfast. With 150mg  for total 225mg    venlafaxine XR 150 MG 24 hr capsule Commonly known as: EFFEXOR-XR Take 150 mg by mouth daily.   Voltaren Arthritis Pain 1 % Gel Generic drug: diclofenac Sodium Apply 2 g topically daily as needed (Knee pain).               Durable Medical Equipment  (From admission, onward)           Start     Ordered   11/11/22 1002  DME 3 n 1  Once        11/11/22 1001   11/11/22 1002  DME Walker rolling  Once       Question Answer Comment  Walker: With 5 Inch Wheels   Patient needs a walker to treat with the following condition Status post total right knee replacement      11/11/22 1001            Diagnostic Studies: DG Knee Right Port  Result Date: 11/11/2022 CLINICAL DATA:  Postop right knee replacement. EXAM: PORTABLE RIGHT KNEE - 1-2 VIEW COMPARISON:  None Available. FINDINGS: Right knee arthroplasty in expected alignment. No periprosthetic  lucency or fracture. Recent postsurgical change includes air and edema in the soft tissues and joint space. Anterior skin staples. IMPRESSION: Right knee arthroplasty without immediate postoperative complication. Electronically Signed   By: Narda Rutherford M.D.   On: 11/11/2022 10:47    Disposition: Discharge disposition: 01-Home or Self Care  Follow-up Information     Kathryne Hitch, MD. Go on 11/24/2022.   Specialty: Orthopedic Surgery Why: at 1:30 pm for your first office visit with Dr. Magnus Ivan after surgery. Contact information: [address] [place] [address] [phone]         [facility] Follow up.   Specialty: Home Health Services Why: Someone from the home health agency will be in contact with you to arrange your first in home physical therapy appointment after discharge. Contact information: [address] [place] [address] [phone]         [facility]. Go on 11/25/2022.   Specialty: Rehabilitation Why: at 11:00 am for your first Outpatient Physical Therapy appointment Contact information: [address] [place] [address] [phone]                 Signed: Kathryne Hitch 11/12/2022, 12:50 PM

## 2022-11-13 ENCOUNTER — Telehealth: Payer: Self-pay | Admitting: *Deleted

## 2022-11-13 DIAGNOSIS — M797 Fibromyalgia: Secondary | ICD-10-CM | POA: Diagnosis not present

## 2022-11-13 DIAGNOSIS — I2510 Atherosclerotic heart disease of native coronary artery without angina pectoris: Secondary | ICD-10-CM | POA: Diagnosis not present

## 2022-11-13 DIAGNOSIS — I10 Essential (primary) hypertension: Secondary | ICD-10-CM | POA: Diagnosis not present

## 2022-11-13 DIAGNOSIS — Z96651 Presence of right artificial knee joint: Secondary | ICD-10-CM | POA: Diagnosis not present

## 2022-11-13 DIAGNOSIS — Z471 Aftercare following joint replacement surgery: Secondary | ICD-10-CM | POA: Diagnosis not present

## 2022-11-13 DIAGNOSIS — Z7985 Long-term (current) use of injectable non-insulin antidiabetic drugs: Secondary | ICD-10-CM | POA: Diagnosis not present

## 2022-11-13 DIAGNOSIS — F419 Anxiety disorder, unspecified: Secondary | ICD-10-CM | POA: Diagnosis not present

## 2022-11-13 DIAGNOSIS — K5900 Constipation, unspecified: Secondary | ICD-10-CM | POA: Diagnosis not present

## 2022-11-13 DIAGNOSIS — D509 Iron deficiency anemia, unspecified: Secondary | ICD-10-CM | POA: Diagnosis not present

## 2022-11-13 NOTE — Telephone Encounter (Signed)
Spoke with patient this morning and she states she has an extremely sore throat and sounds very hoarse. She states that food seems to be getting stuck if it's not soft and having a hard time swallowing. She already takes Protonix and I explained per anesthesia, she had a block and not general. She is drinking fluids, but states her mouth is very dry. I explained that could be from the opioids. We also discussed her pain medication and she was taking 1 every 6 hours, which was not helping her pain. She is aware that Rx says 1-2 every 6 hours, so will try this. Any recommendations for the swallowing issues or sore throat?

## 2022-11-15 DIAGNOSIS — Z471 Aftercare following joint replacement surgery: Secondary | ICD-10-CM | POA: Diagnosis not present

## 2022-11-15 DIAGNOSIS — M797 Fibromyalgia: Secondary | ICD-10-CM | POA: Diagnosis not present

## 2022-11-15 DIAGNOSIS — I10 Essential (primary) hypertension: Secondary | ICD-10-CM | POA: Diagnosis not present

## 2022-11-15 DIAGNOSIS — K5900 Constipation, unspecified: Secondary | ICD-10-CM | POA: Diagnosis not present

## 2022-11-15 DIAGNOSIS — F419 Anxiety disorder, unspecified: Secondary | ICD-10-CM | POA: Diagnosis not present

## 2022-11-15 DIAGNOSIS — I2510 Atherosclerotic heart disease of native coronary artery without angina pectoris: Secondary | ICD-10-CM | POA: Diagnosis not present

## 2022-11-15 DIAGNOSIS — Z7985 Long-term (current) use of injectable non-insulin antidiabetic drugs: Secondary | ICD-10-CM | POA: Diagnosis not present

## 2022-11-15 DIAGNOSIS — Z96651 Presence of right artificial knee joint: Secondary | ICD-10-CM | POA: Diagnosis not present

## 2022-11-15 DIAGNOSIS — D509 Iron deficiency anemia, unspecified: Secondary | ICD-10-CM | POA: Diagnosis not present

## 2022-11-17 ENCOUNTER — Telehealth: Payer: Self-pay | Admitting: *Deleted

## 2022-11-17 ENCOUNTER — Other Ambulatory Visit: Payer: Self-pay | Admitting: Orthopaedic Surgery

## 2022-11-17 DIAGNOSIS — Z7985 Long-term (current) use of injectable non-insulin antidiabetic drugs: Secondary | ICD-10-CM | POA: Diagnosis not present

## 2022-11-17 DIAGNOSIS — F419 Anxiety disorder, unspecified: Secondary | ICD-10-CM | POA: Diagnosis not present

## 2022-11-17 DIAGNOSIS — I2510 Atherosclerotic heart disease of native coronary artery without angina pectoris: Secondary | ICD-10-CM | POA: Diagnosis not present

## 2022-11-17 DIAGNOSIS — Z471 Aftercare following joint replacement surgery: Secondary | ICD-10-CM | POA: Diagnosis not present

## 2022-11-17 DIAGNOSIS — Z96651 Presence of right artificial knee joint: Secondary | ICD-10-CM | POA: Diagnosis not present

## 2022-11-17 DIAGNOSIS — I10 Essential (primary) hypertension: Secondary | ICD-10-CM | POA: Diagnosis not present

## 2022-11-17 DIAGNOSIS — K5900 Constipation, unspecified: Secondary | ICD-10-CM | POA: Diagnosis not present

## 2022-11-17 DIAGNOSIS — M797 Fibromyalgia: Secondary | ICD-10-CM | POA: Diagnosis not present

## 2022-11-17 DIAGNOSIS — D509 Iron deficiency anemia, unspecified: Secondary | ICD-10-CM | POA: Diagnosis not present

## 2022-11-17 MED ORDER — OXYCODONE HCL 5 MG PO TABS: 5.0000 mg | ORAL_TABLET | Freq: Four times a day (QID) | ORAL | 0 refills | Status: DC | PRN

## 2022-11-17 MED ORDER — TIZANIDINE HCL 4 MG PO TABS: 4.0000 mg | ORAL_TABLET | Freq: Four times a day (QID) | ORAL | 0 refills | Status: DC | PRN

## 2022-11-17 NOTE — Telephone Encounter (Signed)
Patient called and discussed her pain. She states she feels she can't get ahead of the pain. Taking 2 Oxycodone pretty regularly as well as muscle relaxer and Gabapentin. Would need refill of Pain medication and muscle relaxer today. Has plenty of Gabapentin currently. We talked about ice and elevating. Anything different you recommend? Thank you.

## 2022-11-19 ENCOUNTER — Other Ambulatory Visit: Payer: Self-pay | Admitting: Orthopaedic Surgery

## 2022-11-19 ENCOUNTER — Telehealth: Payer: Self-pay | Admitting: *Deleted

## 2022-11-19 DIAGNOSIS — I10 Essential (primary) hypertension: Secondary | ICD-10-CM | POA: Diagnosis not present

## 2022-11-19 DIAGNOSIS — D509 Iron deficiency anemia, unspecified: Secondary | ICD-10-CM | POA: Diagnosis not present

## 2022-11-19 DIAGNOSIS — Z7985 Long-term (current) use of injectable non-insulin antidiabetic drugs: Secondary | ICD-10-CM | POA: Diagnosis not present

## 2022-11-19 DIAGNOSIS — I2510 Atherosclerotic heart disease of native coronary artery without angina pectoris: Secondary | ICD-10-CM | POA: Diagnosis not present

## 2022-11-19 DIAGNOSIS — F419 Anxiety disorder, unspecified: Secondary | ICD-10-CM | POA: Diagnosis not present

## 2022-11-19 DIAGNOSIS — Z96651 Presence of right artificial knee joint: Secondary | ICD-10-CM | POA: Diagnosis not present

## 2022-11-19 DIAGNOSIS — K5900 Constipation, unspecified: Secondary | ICD-10-CM | POA: Diagnosis not present

## 2022-11-19 DIAGNOSIS — Z471 Aftercare following joint replacement surgery: Secondary | ICD-10-CM | POA: Diagnosis not present

## 2022-11-19 DIAGNOSIS — M797 Fibromyalgia: Secondary | ICD-10-CM | POA: Diagnosis not present

## 2022-11-19 NOTE — Telephone Encounter (Signed)
Patient called back later in the day and is doing about the same she states; her rehab with therapy is going well, but when she does exercises she swells and pain increases. Encouragement given. reminded to ice and elevate. Will need refill over the weekend. Message to MD

## 2022-11-21 ENCOUNTER — Telehealth: Payer: Self-pay | Admitting: *Deleted

## 2022-11-21 ENCOUNTER — Other Ambulatory Visit: Payer: Self-pay | Admitting: Orthopaedic Surgery

## 2022-11-21 ENCOUNTER — Ambulatory Visit (HOSPITAL_COMMUNITY)
Admission: RE | Admit: 2022-11-21 | Discharge: 2022-11-21 | Disposition: A | Discharge status: Home / Self Care | Payer: Medicare HMO | Source: Ambulatory Visit | Attending: Cardiology | Admitting: Cardiology

## 2022-11-21 ENCOUNTER — Telehealth: Payer: Self-pay

## 2022-11-21 ENCOUNTER — Other Ambulatory Visit: Payer: Self-pay

## 2022-11-21 DIAGNOSIS — M79661 Pain in right lower leg: Secondary | ICD-10-CM

## 2022-11-21 DIAGNOSIS — Z96651 Presence of right artificial knee joint: Secondary | ICD-10-CM

## 2022-11-21 DIAGNOSIS — I10 Essential (primary) hypertension: Secondary | ICD-10-CM | POA: Diagnosis not present

## 2022-11-21 DIAGNOSIS — I2510 Atherosclerotic heart disease of native coronary artery without angina pectoris: Secondary | ICD-10-CM | POA: Diagnosis not present

## 2022-11-21 DIAGNOSIS — Z7985 Long-term (current) use of injectable non-insulin antidiabetic drugs: Secondary | ICD-10-CM | POA: Diagnosis not present

## 2022-11-21 DIAGNOSIS — Z471 Aftercare following joint replacement surgery: Secondary | ICD-10-CM | POA: Diagnosis not present

## 2022-11-21 DIAGNOSIS — K5900 Constipation, unspecified: Secondary | ICD-10-CM | POA: Diagnosis not present

## 2022-11-21 DIAGNOSIS — D509 Iron deficiency anemia, unspecified: Secondary | ICD-10-CM | POA: Diagnosis not present

## 2022-11-21 DIAGNOSIS — M797 Fibromyalgia: Secondary | ICD-10-CM | POA: Diagnosis not present

## 2022-11-21 DIAGNOSIS — F419 Anxiety disorder, unspecified: Secondary | ICD-10-CM | POA: Diagnosis not present

## 2022-11-21 MED ORDER — OXYCODONE HCL 5 MG PO TABS: 5.0000 mg | ORAL_TABLET | Freq: Four times a day (QID) | ORAL | 0 refills | Status: DC | PRN

## 2022-11-21 MED ORDER — TIZANIDINE HCL 4 MG PO TABS: 4.0000 mg | ORAL_TABLET | Freq: Four times a day (QID) | ORAL | 0 refills | Status: DC | PRN

## 2022-11-21 NOTE — Telephone Encounter (Signed)
Patient called this morning concerning her dressing. States it has always had blood on aquacel, but now has increased slightly with greenish tinge that you can see from outside of dressing. Picture sent to me and forwarded to you via cell phone. She has had some low grade fevers with chills occasionally since surgery, but all sounds normal. No malaise and nothing constant. Is it ok if I have her come in and change her aquacel to look at her incision today in the office? She also needs a refill of pain medication and muscle relaxer before the weekend. Thank you.

## 2022-11-21 NOTE — Telephone Encounter (Signed)
Pt was sch for U/S to R/O DVT s/p total knee and [place] [facility] called to advise that pt is negative and that she has been advised and sent home.

## 2022-11-21 NOTE — Telephone Encounter (Signed)
Patient seen in office today for dressing change as discussed with Dr. Magnus Ivan. Aquacel dressing removed in office with normal drainage on bandage. No active bleeding noted. Staples intact. Small area noted at bottom of incision line just medially that was swollen and painful to touch per patient. Dr. Eliberto Ivory PA was in office, so had him come observe and noted it may be a small seroma. Recommended a little heat on it may help it absorb. When CM was changing dressing, noted that patient startled when touched calf and definitely has a + Homan's sign. Doppler u/s ordered by PA to rule out Right LE DVT. Sent [facility] to [facility] for testing. She has been taking her aspirin twice daily as prescribed. Dr. Magnus Ivan is aware.

## 2022-11-24 ENCOUNTER — Ambulatory Visit (INDEPENDENT_AMBULATORY_CARE_PROVIDER_SITE_OTHER): Payer: Medicare HMO | Admitting: Orthopaedic Surgery

## 2022-11-24 ENCOUNTER — Encounter: Payer: Self-pay | Admitting: Orthopaedic Surgery

## 2022-11-24 DIAGNOSIS — Z96651 Presence of right artificial knee joint: Secondary | ICD-10-CM

## 2022-11-24 NOTE — Progress Notes (Signed)
The patient is here for first postoperative visit status post a right total knee replacement.  She is doing well overall.  We were able to answer a lot of questions that she had.  Her calf is soft.  Her extension lacks full extension by about 3 to 5 degrees but it looks pretty good overall.  The incision looks good to remove the staples and place Steri-Strips.  Her flexion is to just past 90 degrees.  Calf is soft.  She was on aspirin once a day prior to surgery so she can go back to just once a day aspirin.  By the end of the week we will refill her pain medications as needed as well as muscle relaxants.  She would like to drive in 2 weeks setting this reasonable if she is doing well and off narcotics.  I did give her a note for return to work sitting on the stool starting the 28th for 4 to 6 hours/day for the first 4 weeks.  We will see her back in 4 weeks from now to make sure she is doing well but no x-rays are needed.  She will transition to outpatient therapy.

## 2022-11-24 NOTE — Therapy (Signed)
OUTPATIENT PHYSICAL THERAPY EVALUATION   Patient Name: SYNIAH DIENES MRN: [id number] DOB:Nov 01, 1955, 67 y.o., female Today's Date: 11/25/2022  END OF SESSION:  PT End of Session - 11/25/22 1052     Visit Number 1    Number of Visits 24    Date for PT Re-Evaluation 02/17/23    Authorization Type [employer] $25 copay    Progress Note Due on Visit 10    PT Start Time 1102    PT Stop Time 1139    PT Time Calculation (min) 37 min    Activity Tolerance Patient limited by pain    Behavior During Therapy [facility] for tasks assessed/performed             Past Medical History:  Diagnosis Date   Abnormal Pap smear 06/19/2005   ASCUS    Anemia    iron deficiency   Anxiety    Coronary artery disease    Fibromyalgia    GI (gastrointestinal bleed) 01/08/2017   Headache(784.0)    Heart murmur    since birth   HGSIL (high grade squamous intraepithelial dysplasia) 2005   Osteoarthritis of right knee    Systemic hypertension 10/11/2013   PATIENT DENIES   Past Surgical History:  Procedure Laterality Date   ABDOMINOPLASTY     after breast reduction   BLADDER SURGERY  02/10/2002   bladder sling   CARDIAC SURGERY  11/19/1998   CABG: LIMA-LAD   CHOLECYSTECTOMY  1981   CYSTECTOMY  06/11/2010   REMOVED ON BUTTOCKS    ESOPHAGOGASTRODUODENOSCOPY (EGD) WITH PROPOFOL Left 01/08/2017   Procedure: ESOPHAGOGASTRODUODENOSCOPY (EGD) WITH PROPOFOL;  Surgeon: Kerin Salen, MD;  Location: [facility] ENDOSCOPY;  Service: Gastroenterology;  Laterality: Left;   GASTRIC BYPASS  06/24/2012   INGUINAL HIDRADENITIS EXCISION  02/11/2011   NOVASURE ABLATION  07/18/2005   REDUCTION MAMMAPLASTY Bilateral    TOTAL KNEE ARTHROPLASTY Right 11/11/2022   Procedure: RIGHT TOTAL KNEE ARTHROPLASTY;  Surgeon: Kathryne Hitch, MD;  Location: [facility] OR;  Service: Orthopedics;  Laterality: Right;   TUBAL LIGATION     Patient Active Problem List   Diagnosis Date Noted   OA (osteoarthritis) of knee 11/11/2022   Status  post total right knee replacement 11/11/2022   Pain in left ankle and joints of left foot 07/05/2021   Knee pain 08/10/2017   Gastrointestinal hemorrhage with melena 01/07/2017   Acute blood loss anemia 01/07/2017   CAD (coronary artery disease) of artery bypass graft 10/11/2013   Systemic hypertension 10/11/2013   Hyperlipidemia 10/11/2013   Hidradenitis suppurativa 10/06/2011   Menopause 07/30/2010   Wears glasses 07/30/2010   Family history of breast cancer 07/30/2010    PCP: Gweneth Dimitri, MD  REFERRING PROVIDER: Kathryne Hitch, MD  REFERRING DIAG: [phone] (ICD-10-CM) - Status post total right knee replacement M17.11 (ICD-10-CM) - Primary osteoarthritis of right knee  THERAPY DIAG:  Chronic pain of right knee  Muscle weakness (generalized)  Stiffness of right knee, not elsewhere classified  Difficulty in walking, not elsewhere classified  Localized edema  Rationale for Evaluation and Treatment: Rehabilitation  ONSET DATE: 11/11/2022 Rt TKA surgery  SUBJECTIVE:   SUBJECTIVE STATEMENT: Pt came to clinic s/p recent Rt TKA on 11/11/2022.   Pt indicated progressively getting better with pain over last week or so.  Pt indicated initially having 9/10 complaints at worst.  Today symptoms are 4/10.   Waking every 3 hours or so in bed.    Pt indicated doing some walking in house without devices but  using furniture. Pt indicated having some complaints of lightheaded and dizziness at times since surgery. Pt indicated having complaints in Rt hip  [place].   PERTINENT HISTORY: CAD, fibromyalgia, HTN  PAIN:  NPRS scale: Current 4/10, 9/10 Pain location: Rt knee Pain description: tightness, ache  Aggravating factors: end ranges, WB activity, constant symptoms.  Relieving factors: ice/elevation  PRECAUTIONS: None  WEIGHT BEARING RESTRICTIONS: No  FALLS:  Has patient fallen in last 6 months? Yes 2 with broken  knee cap Rt knee Pt reported a fear of falling since surgery.   LIVING ENVIRONMENT: Lives with: lives with their spouse Lives in: House/apartment Stairs: bedroom on first, no steps outside.  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  Place has stairs to enter with 3 steps with railing.   PLOF: Independent, outdoor biking, walking.   PATIENT GOALS: Reduce pain, get back to walking  OBJECTIVE:   PATIENT SURVEYS:  11/25/2022 FOTO intake: 64   predicted:  74  COGNITION: 11/25/2022 Overall cognitive status: WFL    SENSATION: 11/25/2022 Not tested  EDEMA:  11/25/2022 Localized edema in Rt knee/Lower leg   MUSCLE LENGTH: 11/25/2022 No specific testing.   POSTURE:  11/25/2022 No Significant postural limitations  PALPATION: 11/25/2022 General tenderness around incision.   LOWER EXTREMITY ROM:   ROM Right 11/25/2022 Left 11/25/2022  Hip flexion    Hip extension    Hip abduction    Hip adduction    Hip internal rotation    Hip external rotation    Knee flexion 95 AROM in supine heel slide     Knee extension -8 AROM in seated LAQ    Ankle dorsiflexion    Ankle plantarflexion    Ankle inversion    Ankle eversion     (Blank rows = not tested)  LOWER EXTREMITY MMT:  MMT Right 11/25/2022 Left 11/25/2022  Hip flexion 4/5 5/5  Hip extension    Hip abduction    Hip adduction    Hip internal rotation    Hip external rotation    Knee flexion 4/5 5/5  Knee extension 4/5 5/5  Ankle dorsiflexion 5/5 5/5  Ankle plantarflexion    Ankle inversion    Ankle eversion     (Blank rows = not tested)  LOWER EXTREMITY SPECIAL TESTS:  11/25/2022 No specific testing.   FUNCTIONAL TESTS:  11/25/2022 18 inch chair transfer: able without hands but with high deviation to Lt leg Lt SLS:  < 3 seconds Rt SLS: unable   TUG c FWW: 12.7 seconds TUG c SPC in Lt UE 14.2 seconds  GAIT: 11/25/2022 Ambulation c FWW  within clinic, observed household distances.   Arrived c FWW.  Can perform SPC in Lt UE ambulation.  TODAY'S TREATMENT                                                                          DATE: 11/25/2022 Therex:    HEP instruction/performance c cues for techniques, handout provided.  Trial set performed of each for comprehension and symptom assessment.  See below for exercise list  Vasopneumatic Rt knee 34 degrees medium compression in elevation 10 mins  PATIENT EDUCATION:  11/25/2022 Education details: HEP, POC Person educated: Patient Education method: Programmer, multimedia, Demonstration, Verbal cues, and Handouts Education comprehension: verbalized understanding, returned demonstration, and verbal cues required  HOME EXERCISE PROGRAM: Access Code: [facility] URL: [email or web address] Date: 11/25/2022 Prepared by: Chyrel Masson  Exercises - Supine Heel Slide  - 3-5 x daily - 7 x weekly - 1 sets - 10 reps - 5 hold - Supine Quadricep Sets  - 3-5 x daily - 7 x weekly - 1 sets - 10 reps - 5 hold - Supine Knee Extension Mobilization with Weight  - 4-5 x daily - 7 x weekly - 1 sets - 1 reps - to tolerance up to 15 mins hold - Seated Long Arc Quad  - 3-5 x daily - 7 x weekly - 1 sets - 5-10 reps - 2 hold - Seated Quad Set  - 3-5 x daily - 7 x weekly - 1 sets - 10 reps - 5 hold  ASSESSMENT:  CLINICAL IMPRESSION: Patient is a 67 y.o. who comes to clinic with complaints of Rt knee pain s/p recent Rt TKA on 11/12/2022 with mobility, strength and movement coordination deficits that impair their ability to perform usual daily and recreational functional activities without increase difficulty/symptoms at this time.  Patient to benefit from skilled PT services to address impairments and limitations to improve to previous  level of function without restriction secondary to condition.   OBJECTIVE IMPAIRMENTS: Abnormal gait, decreased activity tolerance, decreased balance, decreased coordination, decreased endurance, decreased mobility, difficulty walking, decreased ROM, decreased strength, hypomobility, increased edema, increased fascial restrictions, impaired perceived functional ability, impaired flexibility, improper body mechanics, and pain.   ACTIVITY LIMITATIONS: carrying, lifting, bending, sitting, standing, squatting, sleeping, stairs, transfers, bed mobility, bathing, dressing, and locomotion level  PARTICIPATION LIMITATIONS: meal prep, cleaning, laundry, interpersonal relationship, driving, shopping, and community activity  PERSONAL FACTORS: CAD, fibromyalgia, HTN are also affecting patient's functional outcome.   REHAB POTENTIAL: Good  CLINICAL DECISION MAKING: Stable/uncomplicated  EVALUATION COMPLEXITY: Low   GOALS: Goals reviewed with patient? Yes  SHORT TERM GOALS: (target date for Short term goals are 3 weeks 12/16/2022)   1.  Patient will demonstrate independent use of home exercise program to maintain progress from in clinic treatments.  Goal status: New  LONG TERM GOALS: (target dates for all long term goals are 12 weeks  02/17/2023 )   1. Patient will demonstrate/report pain at worst less than or equal to 2/10 to facilitate minimal limitation in daily activity secondary to pain symptoms.  Goal status: New   2. Patient will demonstrate independent use of home exercise program to facilitate ability to maintain/progress functional gains from skilled physical therapy services.  Goal status: New   3. Patient will demonstrate FOTO outcome > or = 74 % to indicate reduced disability due  to condition.  Goal status: New   4.  Patient will demonstrate Rt LE MMT 5/5 throughout to faciltiate usual transfers, stairs, squatting at [facility] for daily life.   Goal status: New   5.  Patient will  demonstrate Rt knee AROM 0-110 deg to facilitate usual transfers, ambulation, and other daily movement at PLOF.  Goal status: New   6.  Patient will demonstrate independent ambulation community distances > 300 ft for community integration.  Goal status: New   7.  Patient will demonstrate ascending/descending stairs reciprocally s UE assist for community integration.   Goal Status: New   PLAN:  PT FREQUENCY: 1-2x/week  PT DURATION: 12 weeks  PLANNED INTERVENTIONS: Therapeutic exercises, Therapeutic activity, Neuro Muscular re-education, Balance training, Gait training, Patient/Family education, Joint mobilization, Stair training, DME instructions, Dry Needling, Electrical stimulation, Traction, Cryotherapy, vasopneumatic deviceMoist heat, Taping, Ultrasound, Ionotophoresis 4mg /ml Dexamethasone, and aquatic therapy, Manual therapy.  All included unless contraindicated  PLAN FOR NEXT SESSION: Review HEP knowledge/results.  ROM gains   Chyrel Masson, PT, DPT, OCS, ATC 11/25/22  12:03 PM   Referring diagnosis? V37.106 (ICD-10-CM) - Status post total right knee replacement M17.11 (ICD-10-CM) - Primary osteoarthritis of right knee Treatment diagnosis? (if different than referring diagnosis) M25.561 What was this (referring dx) caused by? [x]  Surgery []  Fall []  Ongoing issue []  Arthritis []  Other: ____________  Laterality: [x]  Rt []  Lt []  Both  Check all possible CPT codes:  *CHOOSE 10 OR LESS*    []  97110 (Therapeutic Exercise)  []  92507 (SLP Treatment)  []  97112 (Neuro Re-ed)   []  92526 (Swallowing Treatment)   []  97116 (Gait Training)   []  [id number] (Cognitive Training, 1st 15 minutes) []  97140 (Manual Therapy)   []  97130 (Cognitive Training, each add'l 15 minutes)  []  97164 (Re-evaluation)                              []  Other, List CPT Code ____________  []  97530 (Therapeutic Activities)     []  97535 (Self Care)   [x]  All codes above (97110 - 97535)  []  97012 (Mechanical  Traction)  [x]  97014 (E-stim Unattended)  []  97032 (E-stim manual)  []  97033 (Ionto)  []  26948 (Ultrasound) [x]  97750 (Physical Performance Training) []  [id number] (Aquatic Therapy) [x]  97016 (Vasopneumatic Device) []  [id number] (Paraffin) []  97034 (Contrast Bath) []  97597 (Wound Care 1st 20 sq cm) []  97598 (Wound Care each add'l 20 sq cm) []  97760 (Orthotic Fabrication, Fitting, Training Initial) []  [id number] (Prosthetic Management and Training Initial) []  [id number] (Orthotic or Prosthetic Training/ Modification Subsequent)

## 2022-11-25 ENCOUNTER — Encounter: Payer: Self-pay | Admitting: Rehabilitative and Restorative Service Providers"

## 2022-11-25 ENCOUNTER — Other Ambulatory Visit: Payer: Self-pay

## 2022-11-25 ENCOUNTER — Ambulatory Visit: Payer: Medicare HMO | Admitting: Rehabilitative and Restorative Service Providers"

## 2022-11-25 DIAGNOSIS — G8929 Other chronic pain: Secondary | ICD-10-CM

## 2022-11-25 DIAGNOSIS — M25561 Pain in right knee: Secondary | ICD-10-CM

## 2022-11-25 DIAGNOSIS — R600 Localized edema: Secondary | ICD-10-CM | POA: Diagnosis not present

## 2022-11-25 DIAGNOSIS — M6281 Muscle weakness (generalized): Secondary | ICD-10-CM

## 2022-11-25 DIAGNOSIS — R262 Difficulty in walking, not elsewhere classified: Secondary | ICD-10-CM

## 2022-11-25 DIAGNOSIS — M25661 Stiffness of right knee, not elsewhere classified: Secondary | ICD-10-CM | POA: Diagnosis not present

## 2022-11-27 ENCOUNTER — Encounter: Payer: Medicare HMO | Admitting: Rehabilitative and Restorative Service Providers"

## 2022-11-28 ENCOUNTER — Telehealth: Payer: Self-pay

## 2022-11-28 ENCOUNTER — Other Ambulatory Visit: Payer: Self-pay | Admitting: Orthopaedic Surgery

## 2022-11-28 MED ORDER — OXYCODONE HCL 5 MG PO TABS: 5.0000 mg | ORAL_TABLET | Freq: Four times a day (QID) | ORAL | 0 refills | Status: DC | PRN

## 2022-11-28 NOTE — Telephone Encounter (Signed)
Patient asking for refill of pain medication  Right TKA 11/11/22

## 2022-12-01 ENCOUNTER — Telehealth: Payer: Self-pay | Admitting: *Deleted

## 2022-12-01 ENCOUNTER — Encounter: Payer: Self-pay | Admitting: Pharmacist

## 2022-12-01 ENCOUNTER — Other Ambulatory Visit (HOSPITAL_COMMUNITY): Payer: Self-pay

## 2022-12-01 ENCOUNTER — Telehealth: Payer: Self-pay | Admitting: Pharmacy Technician

## 2022-12-01 NOTE — Telephone Encounter (Signed)
Pharmacy Patient Advocate Encounter  Received notification from [facility] that Prior Authorization for repatha has been APPROVED from 02/10/22 to 02/10/24. Ran test claim, Copay is $135.00- 3 months. This test claim was processed through [facility]- copay amounts may vary at other pharmacies due to pharmacy/plan contracts, or as the patient moves through the different stages of their insurance plan.   PA #/Case ID/Reference #: [id number]

## 2022-12-01 NOTE — Telephone Encounter (Signed)
Pharmacy Patient Advocate Encounter   Received notification from  staff messages  that prior authorization for repatha is required/requested.   Insurance verification completed.   The patient is insured through [place] .   Per test claim: PA required; PA submitted to [facility] via CoverMyMeds Key/confirmation #/EOC [id number] Status is pending

## 2022-12-01 NOTE — Telephone Encounter (Signed)
Wanted to check with you, but just spoke with patient and she felt like she'd be able to RTW next week on 12/08/22, but is really having a hard time and wanted to see if we could push the work note until after she sees you at the 6 week appt which is 12/22/22. I told her we'd just write it for 12/23/22 Return date if you're agreeable. She's not sleeping and having a hard time with endurance right now. Thank you.

## 2022-12-01 NOTE — Telephone Encounter (Signed)
-----   Message from Jobos sent at 12/01/2022 11:16 AM EDT ----- Please submit continuation of Repatha prior Berkley Harvey - she has new insurance (Medicare now)

## 2022-12-01 NOTE — Telephone Encounter (Signed)
I wrote that note for you. You should be able to pull from MyChart, but if not I would be glad to print it off for you if you come here to get it?

## 2022-12-02 ENCOUNTER — Encounter: Payer: Self-pay | Admitting: Physical Therapy

## 2022-12-02 ENCOUNTER — Ambulatory Visit: Payer: Medicare HMO | Admitting: Physical Therapy

## 2022-12-02 DIAGNOSIS — M25561 Pain in right knee: Secondary | ICD-10-CM

## 2022-12-02 DIAGNOSIS — R600 Localized edema: Secondary | ICD-10-CM | POA: Diagnosis not present

## 2022-12-02 DIAGNOSIS — R262 Difficulty in walking, not elsewhere classified: Secondary | ICD-10-CM | POA: Diagnosis not present

## 2022-12-02 DIAGNOSIS — M6281 Muscle weakness (generalized): Secondary | ICD-10-CM

## 2022-12-02 DIAGNOSIS — G8929 Other chronic pain: Secondary | ICD-10-CM

## 2022-12-02 DIAGNOSIS — M25661 Stiffness of right knee, not elsewhere classified: Secondary | ICD-10-CM | POA: Diagnosis not present

## 2022-12-02 NOTE — Therapy (Addendum)
OUTPATIENT PHYSICAL THERAPY    Patient Name: Sierra Lucas MRN: [id number] DOB:1955/05/13, 67 y.o., female Today's Date: 12/02/2022  END OF SESSION:  PT End of Session - 12/02/22 1103     Visit Number 2    Number of Visits 24    Date for PT Re-Evaluation 02/17/23    Authorization Type [employer] $25 copay    Progress Note Due on Visit 10    PT Start Time 1005    PT Stop Time 1050    PT Time Calculation (min) 45 min    Activity Tolerance Patient limited by pain    Behavior During Therapy [facility] for tasks assessed/performed              Past Medical History:  Diagnosis Date   Abnormal Pap smear 06/19/2005   ASCUS    Anemia    iron deficiency   Anxiety    Coronary artery disease    Fibromyalgia    GI (gastrointestinal bleed) 01/08/2017   Headache(784.0)    Heart murmur    since birth   HGSIL (high grade squamous intraepithelial dysplasia) 2005   Osteoarthritis of right knee    Systemic hypertension 10/11/2013   PATIENT DENIES   Past Surgical History:  Procedure Laterality Date   ABDOMINOPLASTY     after breast reduction   BLADDER SURGERY  02/10/2002   bladder sling   CARDIAC SURGERY  11/19/1998   CABG: LIMA-LAD   CHOLECYSTECTOMY  1981   CYSTECTOMY  06/11/2010   REMOVED ON BUTTOCKS    ESOPHAGOGASTRODUODENOSCOPY (EGD) WITH PROPOFOL Left 01/08/2017   Procedure: ESOPHAGOGASTRODUODENOSCOPY (EGD) WITH PROPOFOL;  Surgeon: Kerin Salen, MD;  Location: [facility] ENDOSCOPY;  Service: Gastroenterology;  Laterality: Left;   GASTRIC BYPASS  06/24/2012   INGUINAL HIDRADENITIS EXCISION  02/11/2011   NOVASURE ABLATION  07/18/2005   REDUCTION MAMMAPLASTY Bilateral    TOTAL KNEE ARTHROPLASTY Right 11/11/2022   Procedure: RIGHT TOTAL KNEE ARTHROPLASTY;  Surgeon: Kathryne Hitch, MD;  Location: [facility] OR;  Service: Orthopedics;  Laterality: Right;   TUBAL LIGATION     Patient Active Problem List   Diagnosis Date Noted   OA (osteoarthritis) of knee 11/11/2022   Status post total  right knee replacement 11/11/2022   Pain in left ankle and joints of left foot 07/05/2021   Knee pain 08/10/2017   Gastrointestinal hemorrhage with melena 01/07/2017   Acute blood loss anemia 01/07/2017   CAD (coronary artery disease) of artery bypass graft 10/11/2013   Systemic hypertension 10/11/2013   Hyperlipidemia 10/11/2013   Hidradenitis suppurativa 10/06/2011   Menopause 07/30/2010   Wears glasses 07/30/2010   Family history of breast cancer 07/30/2010    PCP: Gweneth Dimitri, MD  REFERRING PROVIDER: Kathryne Hitch, MD  REFERRING DIAG: [id number] (ICD-10-CM) - Status post total right knee replacement M17.11 (ICD-10-CM) - Primary osteoarthritis of right knee  THERAPY DIAG:  Chronic pain of right knee  Muscle weakness (generalized)  Stiffness of right knee, not elsewhere classified  Difficulty in walking, not elsewhere classified  Localized edema  Rationale for Evaluation and Treatment: Rehabilitation  ONSET DATE: 11/11/2022 Rt TKA surgery  SUBJECTIVE:   SUBJECTIVE STATEMENT: 12/02/2022 Pt arriving upset about her increased pain and inability to perform her HEP. Pt reporting 2/10 in her Rt knee at rest. Pt reporting 10/10 at worse.    Eval:  Pt came to clinic s/p recent Rt TKA on 11/11/2022.   Pt indicated progressively getting better with pain over last week or so.  Pt indicated  initially having 9/10 complaints at worst.  Today symptoms are 4/10.   Waking every 3 hours or so in bed.    Pt indicated doing some walking in house without devices but using furniture. Pt indicated having some complaints of lightheaded and dizziness at times since surgery. Pt indicated having complaints in Rt hip  [place].   PERTINENT HISTORY: CAD, fibromyalgia, HTN  PAIN:  NPRS scale: Current 2/10, 9-10/10 Pain location: Rt knee Pain description: tightness, ache  Aggravating factors: end ranges, WB activity, constant  symptoms.  Relieving factors: ice/elevation  PRECAUTIONS: None  WEIGHT BEARING RESTRICTIONS: No  FALLS:  Has patient fallen in last 6 months? Yes 2 with broken knee cap Rt knee Pt reported a fear of falling since surgery.   LIVING ENVIRONMENT: Lives with: lives with their spouse Lives in: House/apartment Stairs: bedroom on first, no steps outside.  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  Place has stairs to enter with 3 steps with railing.   PLOF: Independent, outdoor biking, walking.   PATIENT GOALS: Reduce pain, get back to walking  OBJECTIVE:   PATIENT SURVEYS:  11/25/2022 FOTO intake: 64   predicted:  74  COGNITION: 11/25/2022 Overall cognitive status: WFL    SENSATION: 11/25/2022 Not tested  EDEMA:  11/25/2022 Localized edema in Rt knee/Lower leg   MUSCLE LENGTH: 11/25/2022 No specific testing.   POSTURE:  11/25/2022 No Significant postural limitations  PALPATION: 11/25/2022 General tenderness around incision.   LOWER EXTREMITY ROM:   ROM Right 11/25/2022 Left 11/25/2022 Rt 12/02/22  Hip flexion     Hip extension     Hip abduction     Hip adduction     Hip internal rotation     Hip external rotation     Knee flexion 95 AROM in supine heel slide    A: 110 P: 115  Knee extension -8 AROM in seated LAQ   A: -5 P: -2  Ankle dorsiflexion     Ankle plantarflexion     Ankle inversion     Ankle eversion      (Blank rows = not tested)  LOWER EXTREMITY MMT:  MMT Right 11/25/2022 Left 11/25/2022  Hip flexion 4/5 5/5  Hip extension    Hip abduction    Hip adduction    Hip internal rotation    Hip external rotation    Knee flexion 4/5 5/5  Knee extension 4/5 5/5  Ankle dorsiflexion 5/5 5/5  Ankle plantarflexion    Ankle inversion    Ankle eversion     (Blank rows = not tested)  LOWER EXTREMITY SPECIAL TESTS:  11/25/2022 No specific testing.   FUNCTIONAL TESTS:   11/25/2022 18 inch chair transfer: able without hands but with high deviation to Lt leg Lt SLS:  < 3 seconds Rt SLS: unable   TUG c FWW: 12.7 seconds TUG c SPC in Lt UE 14.2 seconds  GAIT: 11/25/2022 Ambulation c FWW within clinic, observed household distances.   Arrived c FWW.  Can perform SPC in Lt UE ambulation.  TODAY'S TREATMENT                                                                          DATE: 12/02/2022 Therex: Scifit: seat 7, full revolutions x 8 minutes Leg Press: bil 56# x 20, Rt LE only 50# 2 x 10  Seated LAQ: 2 # 2 x 10 Seated AAROM knee flexion Supine quad sets x 10 Supine SLR: x20 Modalities:  Vasopneumatic: medium compression, LE elevated, 34 deg Manual:  PROM knee flexion/extension     TODAY'S TREATMENT                                                                          DATE: 11/25/2022 Therex:    HEP instruction/performance c cues for techniques, handout provided.  Trial set performed of each for comprehension and symptom assessment.  See below for exercise list  Vasopneumatic Rt knee 34 degrees medium compression in elevation 10 mins  PATIENT EDUCATION:  11/25/2022 Education details: HEP, POC Person educated: Patient Education method: Programmer, multimedia, Demonstration, Verbal cues, and Handouts Education comprehension: verbalized understanding, returned demonstration, and verbal cues required  HOME EXERCISE PROGRAM: Access Code: [facility] URL: [email or web address] Date: 11/25/2022 Prepared by: Chyrel Masson  Exercises - Supine Heel Slide  - 3-5 x daily - 7 x weekly - 1 sets - 10 reps - 5 hold - Supine Quadricep Sets  - 3-5 x daily - 7 x weekly - 1 sets - 10 reps - 5 hold - Supine Knee Extension Mobilization with Weight  - 4-5 x daily - 7 x weekly - 1 sets -  1 reps - to tolerance up to 15 mins hold - Seated Long Arc Quad  - 3-5 x daily - 7 x weekly - 1 sets - 5-10 reps - 2 hold - Seated Quad Set  - 3-5 x daily - 7 x weekly - 1 sets - 10 reps - 5 hold  ASSESSMENT:  CLINICAL IMPRESSION: Pt arriving today with 2/10 pain in her Rt knee. Pt reporting while performing her HEP her pain increased to 9-10/10. Pt was reassured that she is on track. Pt's active ROM today was 5 to 110 degrees in Rt knee. Pt was instructed in lymph massage/drainage and self soft tissue mobs for tightness in quads and IT band. Pt encouraged to wear her thigh high compression stockings as needed for swelling.  Continue skilled PT interventions.   OBJECTIVE IMPAIRMENTS: Abnormal gait, decreased activity tolerance, decreased balance, decreased coordination, decreased endurance, decreased mobility, difficulty walking, decreased ROM, decreased strength, hypomobility, increased edema, increased fascial restrictions, impaired perceived functional ability, impaired flexibility, improper body mechanics, and pain.   ACTIVITY LIMITATIONS: carrying, lifting, bending, sitting, standing, squatting, sleeping, stairs, transfers, bed mobility, bathing, dressing, and locomotion level  PARTICIPATION LIMITATIONS: meal prep, cleaning, laundry, interpersonal relationship, driving, shopping, and community activity  PERSONAL FACTORS: CAD, fibromyalgia, HTN are also affecting patient's functional outcome.   REHAB POTENTIAL: Good  CLINICAL DECISION MAKING: Stable/uncomplicated  EVALUATION COMPLEXITY: Low   GOALS: Goals reviewed with patient? Yes  SHORT TERM GOALS: (target date for Short term goals are 3 weeks 12/16/2022)   1.  Patient will demonstrate independent use of home exercise program to maintain progress from in clinic treatments.  Goal status: New  LONG TERM GOALS: (target dates for all long term goals are 12 weeks  02/17/2023 )   1. Patient will demonstrate/report pain at worst less  than or equal to 2/10 to facilitate minimal limitation in daily activity secondary to pain symptoms.  Goal status: New   2. Patient will demonstrate independent use of home exercise program to facilitate ability to maintain/progress functional gains from skilled physical therapy services.  Goal status: New   3. Patient will demonstrate FOTO outcome > or = 74 % to indicate reduced disability due to condition.  Goal status: New   4.  Patient will demonstrate Rt LE MMT 5/5 throughout to faciltiate usual transfers, stairs, squatting at [facility] for daily life.   Goal status: New   5.  Patient will demonstrate Rt knee AROM 0-110 deg to facilitate usual transfers, ambulation, and other daily movement at PLOF.  Goal status: New   6.  Patient will demonstrate independent ambulation community distances > 300 ft for community integration.  Goal status: New   7.  Patient will demonstrate ascending/descending stairs reciprocally s UE assist for community integration.   Goal Status: New   PLAN:  PT FREQUENCY: 1-2x/week  PT DURATION: 12 weeks  PLANNED INTERVENTIONS: Therapeutic exercises, Therapeutic activity, Neuro Muscular re-education, Balance training, Gait training, Patient/Family education, Joint mobilization, Stair training, DME instructions, Dry Needling, Electrical stimulation, Traction, Cryotherapy, vasopneumatic deviceMoist heat, Taping, Ultrasound, Ionotophoresis 4mg /ml Dexamethasone, and aquatic therapy, Manual therapy.  All included unless contraindicated  PLAN FOR NEXT SESSION: update HEP as necessary. ROM gains, quad strengthening, start weaning away from straight cane   Narda Amber, PT, MPT 12/02/22 11:04 AM   12/02/22  11:04 AM   Referring diagnosis? I69.629 (ICD-10-CM) - Status post total right knee replacement M17.11 (ICD-10-CM) - Primary osteoarthritis of right knee Treatment diagnosis? (if different than referring diagnosis) M25.561 What was this (referring dx)  caused by? [x]  Surgery []  Fall []  Ongoing issue []  Arthritis []  Other: ____________  Laterality: [x]  Rt []  Lt []  Both  Check all possible CPT codes:  *CHOOSE 10 OR LESS*    []  97110 (Therapeutic Exercise)  []  92507 (SLP Treatment)  []  97112 (Neuro Re-ed)   []  92526 (Swallowing Treatment)   []  97116 (Gait Training)   []  [id number] (Cognitive Training, 1st 15 minutes) []  97140 (Manual Therapy)   []  97130 (Cognitive Training, each add'l 15 minutes)  []  97164 (Re-evaluation)                              []  Other, List CPT Code ____________  []  97530 (Therapeutic Activities)     []  97535 (Self Care)   [x]  All codes above (97110 - 97535)  []  97012 (Mechanical Traction)  [x]  52841 (E-stim Unattended)  []  97032 (E-stim manual)  []  97033 (Ionto)  []  97035 (Ultrasound) [x]  97750 (Physical Performance Training) []  [id number] (Aquatic Therapy) [x]  97016 (Vasopneumatic Device) []  [id number] (Paraffin) []  97034 (Contrast Bath) []  97597 (Wound Care 1st 20 sq cm) []  97598 (Wound Care each add'l 20 sq cm) []  97760 (Orthotic Fabrication, Fitting, Training Initial) []  [id number] (Prosthetic Management and Training Initial) []  [id number] (Orthotic or Prosthetic Training/ Modification  Subsequent)

## 2022-12-04 ENCOUNTER — Ambulatory Visit: Payer: Medicare HMO | Admitting: Physical Therapy

## 2022-12-04 ENCOUNTER — Encounter: Payer: Self-pay | Admitting: Physical Therapy

## 2022-12-04 DIAGNOSIS — G8929 Other chronic pain: Secondary | ICD-10-CM

## 2022-12-04 DIAGNOSIS — R600 Localized edema: Secondary | ICD-10-CM

## 2022-12-04 DIAGNOSIS — R262 Difficulty in walking, not elsewhere classified: Secondary | ICD-10-CM

## 2022-12-04 DIAGNOSIS — M6281 Muscle weakness (generalized): Secondary | ICD-10-CM

## 2022-12-04 DIAGNOSIS — M25661 Stiffness of right knee, not elsewhere classified: Secondary | ICD-10-CM | POA: Diagnosis not present

## 2022-12-04 DIAGNOSIS — M25561 Pain in right knee: Secondary | ICD-10-CM | POA: Diagnosis not present

## 2022-12-04 NOTE — Plan of Care (Signed)
[facility] Tonsillectomy/Adenoidectomy, Postoperative PEDS care plan entered in error.

## 2022-12-04 NOTE — Therapy (Addendum)
OUTPATIENT PHYSICAL THERAPY    Patient Name: Sierra Lucas MRN: [id number] DOB:[birth year], 67 y.o., female Today's Date: 12/04/2022  END OF SESSION:  PT End of Session - 12/04/22 1056     Visit Number 3    Number of Visits 24    Date for PT Re-Evaluation 02/17/23    Authorization Type [employer] $25 copay    Progress Note Due on Visit 10    PT Start Time 1055    PT Stop Time 1152    PT Time Calculation (min) 57 min    Activity Tolerance Patient limited by pain    Behavior During Therapy [facility] for tasks assessed/performed               Past Medical History:  Diagnosis Date   Abnormal Pap smear 06/19/2005   ASCUS    Anemia    iron deficiency   Anxiety    Coronary artery disease    Fibromyalgia    GI (gastrointestinal bleed) 01/08/2017   Headache(784.0)    Heart murmur    since birth   HGSIL (high grade squamous intraepithelial dysplasia) 2005   Osteoarthritis of right knee    Systemic hypertension 10/11/2013   PATIENT DENIES   Past Surgical History:  Procedure Laterality Date   ABDOMINOPLASTY     after breast reduction   BLADDER SURGERY  02/10/2002   bladder sling   CARDIAC SURGERY  11/19/1998   CABG: LIMA-LAD   CHOLECYSTECTOMY  1981   CYSTECTOMY  06/11/2010   REMOVED ON BUTTOCKS    ESOPHAGOGASTRODUODENOSCOPY (EGD) WITH PROPOFOL Left 01/08/2017   Procedure: ESOPHAGOGASTRODUODENOSCOPY (EGD) WITH PROPOFOL;  Surgeon: Kerin Salen, MD;  Location: [facility] ENDOSCOPY;  Service: Gastroenterology;  Laterality: Left;   GASTRIC BYPASS  06/24/2012   INGUINAL HIDRADENITIS EXCISION  02/11/2011   NOVASURE ABLATION  07/18/2005   REDUCTION MAMMAPLASTY Bilateral    TOTAL KNEE ARTHROPLASTY Right 11/11/2022   Procedure: RIGHT TOTAL KNEE ARTHROPLASTY;  Surgeon: Kathryne Hitch, MD;  Location: [facility] OR;  Service: Orthopedics;  Laterality: Right;   TUBAL LIGATION     Patient Active Problem List   Diagnosis Date Noted   OA (osteoarthritis) of knee 11/11/2022   Status post  total right knee replacement 11/11/2022   Pain in left ankle and joints of left foot 07/05/2021   Knee pain 08/10/2017   Gastrointestinal hemorrhage with melena 01/07/2017   Acute blood loss anemia 01/07/2017   CAD (coronary artery disease) of artery bypass graft 10/11/2013   Systemic hypertension 10/11/2013   Hyperlipidemia 10/11/2013   Hidradenitis suppurativa 10/06/2011   Menopause 07/30/2010   Wears glasses 07/30/2010   Family history of breast cancer 07/30/2010    PCP: Gweneth Dimitri, MD  REFERRING PROVIDER: Kathryne Hitch, MD  REFERRING DIAG: [phone] (ICD-10-CM) - Status post total right knee replacement M17.11 (ICD-10-CM) - Primary osteoarthritis of right knee  THERAPY DIAG:  Chronic pain of right knee  Muscle weakness (generalized)  Stiffness of right knee, not elsewhere classified  Difficulty in walking, not elsewhere classified  Localized edema  Rationale for Evaluation and Treatment: Rehabilitation  ONSET DATE: 11/11/2022 Rt TKA surgery  SUBJECTIVE:   SUBJECTIVE STATEMENT: 12/04/2022 patient reports significantly less discomfort after last PT session.  Her knee seems more swollen. She's elevating LE higher than heart as advised.   Eval:  Pt came to clinic s/p recent Rt TKA on 11/11/2022.   Pt indicated progressively getting better with pain over last week or so.  Pt indicated initially having 9/10 complaints  at worst.  Today symptoms are 4/10.   Waking every 3 hours or so in bed.    Pt indicated doing some walking in house without devices but using furniture. Pt indicated having some complaints of lightheaded and dizziness at times since surgery. Pt indicated having complaints in Rt hip  [place]   PERTINENT HISTORY: CAD, fibromyalgia, HTN  PAIN:  NPRS scale: Current  5/10, since last PT session 0/10 - 6/10 Pain location: Rt knee Pain description: tightness, ache  Aggravating factors: end  ranges, WB activity, constant symptoms.  Relieving factors: ice/elevation  PRECAUTIONS: None  WEIGHT BEARING RESTRICTIONS: No  FALLS:  Has patient fallen in last 6 months? Yes 2 with broken knee cap Rt knee Pt reported a fear of falling since surgery.   LIVING ENVIRONMENT: Lives with: lives with their spouse Lives in: House/apartment Stairs: bedroom on first, no steps outside.  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  Place has stairs to enter with 3 steps with railing.   PLOF: Independent, outdoor biking, walking.   PATIENT GOALS: Reduce pain, get back to walking  OBJECTIVE:   PATIENT SURVEYS:  11/25/2022 FOTO intake: 64   predicted:  74  COGNITION: 11/25/2022 Overall cognitive status: WFL    SENSATION: 11/25/2022 Not tested  EDEMA:  11/25/2022 Localized edema in Rt knee/Lower leg   MUSCLE LENGTH: 11/25/2022 No specific testing.   POSTURE:  11/25/2022 No Significant postural limitations  PALPATION: 11/25/2022 General tenderness around incision.   LOWER EXTREMITY ROM:   ROM Right 11/25/2022 Left 11/25/2022 Rt 12/02/22  Hip flexion     Hip extension     Hip abduction     Hip adduction     Hip internal rotation     Hip external rotation     Knee flexion 95 AROM in supine heel slide    A: 110 P: 115  Knee extension -8 AROM in seated LAQ   A: -5 P: -2  Ankle dorsiflexion     Ankle plantarflexion     Ankle inversion     Ankle eversion      (Blank rows = not tested)  LOWER EXTREMITY MMT:  MMT Right 11/25/2022 Left 11/25/2022  Hip flexion 4/5 5/5  Hip extension    Hip abduction    Hip adduction    Hip internal rotation    Hip external rotation    Knee flexion 4/5 5/5  Knee extension 4/5 5/5  Ankle dorsiflexion 5/5 5/5  Ankle plantarflexion    Ankle inversion    Ankle eversion     (Blank rows = not tested)  LOWER EXTREMITY SPECIAL TESTS:  11/25/2022 No specific testing.    FUNCTIONAL TESTS:  11/25/2022 18 inch chair transfer: able without hands but with high deviation to Lt leg Lt SLS:  < 3 seconds Rt SLS: unable   TUG c FWW: 12.7 seconds TUG c SPC in Lt UE 14.2 seconds  GAIT: 11/25/2022 Ambulation c FWW within clinic, observed household distances.   Arrived c FWW.  Can perform SPC in Lt UE ambulation.  TODAY'S TREATMENT                                                                          DATE: 12/04/2022 Therex: Scifit: seat 7, full revolutions x 8 minutes;  PT verbal cues on riding home bike 10 min 2 sets with timed 5 min rest bw sets, reducing rest as able until 20 min straight. Precor bike seat 4 for 2 min rocking for gentle stretch, then full revolutions for 1 min to understand difference in lever arm of typical recumbent bike. Pt verbalized understanding. Leg Press: bil 56# x 20, Rt LE only 50# 2 x 10 Tandem stance on foam beam 30 sec RLE in front and in back with intermittent touch //bars Seated LAQ: 2 # 2 x 10 Seated heel slide on pillow case ext / quad set 5 sec hold and AROM knee flexion with assist LLE & hamstring set 5 sec 10 reps PT demo & verbal cues on technique on sit <> stand. 10 reps without UE support.  Supine SLR RLE 10 reps Supine heel slide with strap & 18" ball 10 reps  Modalities:  Vasopneumatic: medium compression, LE elevated, 34 deg doing alphabet 2 sets   TREATMENT                                                                          DATE: 12/02/2022 Therex: Scifit: seat 7, full revolutions x 8 minutes Leg Press: bil 56# x 20, BLEs 81# 15 reps; Rt LE only 50# 2 x 10  Seated LAQ: 2 # 2 x 10 Seated AAROM knee flexion Supine quad sets x 10 Supine SLR: x20 Modalities:  Vasopneumatic: medium compression, LE elevated, 34 deg Manual:  PROM knee  flexion/extension     TODAY'S TREATMENT                                                                          DATE: 11/25/2022 Therex:    HEP instruction/performance c cues for techniques, handout provided.  Trial set performed of each for comprehension and symptom assessment.  See below for exercise list  Vasopneumatic Rt knee 34 degrees medium compression in elevation 10 mins  PATIENT EDUCATION:  11/25/2022 Education details: HEP, POC Person educated: Patient Education method: Programmer, multimedia, Demonstration, Verbal cues, and Handouts Education comprehension: verbalized understanding, returned demonstration, and verbal cues required  HOME EXERCISE PROGRAM: Access Code: [facility] URL: [email or web address] Date: 11/25/2022 Prepared by: Chyrel Masson  Exercises - Supine Heel Slide  - 3-5 x daily - 7 x weekly - 1 sets - 10 reps - 5 hold - Supine Quadricep Sets  - 3-5 x daily - 7 x weekly - 1 sets -  10 reps - 5 hold - Supine Knee Extension Mobilization with Weight  - 4-5 x daily - 7 x weekly - 1 sets - 1 reps - to tolerance up to 15 mins hold - Seated Long Arc Quad  - 3-5 x daily - 7 x weekly - 1 sets - 5-10 reps - 2 hold - Seated Quad Set  - 3-5 x daily - 7 x weekly - 1 sets - 10 reps - 5 hold  ASSESSMENT:  CLINICAL IMPRESSION: Patient tolerated slight increase in exercises today.  She improved tolerance to knee range with techniques to reduce resistance. Patient continues to benefit from skilled PT.   OBJECTIVE IMPAIRMENTS: Abnormal gait, decreased activity tolerance, decreased balance, decreased coordination, decreased endurance, decreased mobility, difficulty walking, decreased ROM, decreased strength, hypomobility, increased edema, increased fascial restrictions, impaired perceived functional ability, impaired flexibility, improper body mechanics, and pain.   ACTIVITY LIMITATIONS: carrying, lifting, bending, sitting, standing, squatting, sleeping, stairs,  transfers, bed mobility, bathing, dressing, and locomotion level  PARTICIPATION LIMITATIONS: meal prep, cleaning, laundry, interpersonal relationship, driving, shopping, and community activity  PERSONAL FACTORS: CAD, fibromyalgia, HTN are also affecting patient's functional outcome.   REHAB POTENTIAL: Good  CLINICAL DECISION MAKING: Stable/uncomplicated  EVALUATION COMPLEXITY: Low   GOALS: Goals reviewed with patient? Yes  SHORT TERM GOALS: (target date for Short term goals are 3 weeks 12/16/2022)   1.  Patient will demonstrate independent use of home exercise program to maintain progress from in clinic treatments.  Goal status: New  LONG TERM GOALS: (target dates for all long term goals are 12 weeks  02/17/2023 )   1. Patient will demonstrate/report pain at worst less than or equal to 2/10 to facilitate minimal limitation in daily activity secondary to pain symptoms.  Goal status: New   2. Patient will demonstrate independent use of home exercise program to facilitate ability to maintain/progress functional gains from skilled physical therapy services.  Goal status: New   3. Patient will demonstrate FOTO outcome > or = 74 % to indicate reduced disability due to condition.  Goal status: New   4.  Patient will demonstrate Rt LE MMT 5/5 throughout to faciltiate usual transfers, stairs, squatting at [facility] for daily life.   Goal status: New   5.  Patient will demonstrate Rt knee AROM 0-110 deg to facilitate usual transfers, ambulation, and other daily movement at PLOF.  Goal status: New   6.  Patient will demonstrate independent ambulation community distances > 300 ft for community integration.  Goal status: New   7.  Patient will demonstrate ascending/descending stairs reciprocally s UE assist for community integration.   Goal Status: New   PLAN:  PT FREQUENCY: 1-2x/week  PT DURATION: 12 weeks  PLANNED INTERVENTIONS: Therapeutic exercises, Therapeutic activity, Neuro  Muscular re-education, Balance training, Gait training, Patient/Family education, Joint mobilization, Stair training, DME instructions, Dry Needling, Electrical stimulation, Traction, Cryotherapy, vasopneumatic deviceMoist heat, Taping, Ultrasound, Ionotophoresis 4mg /ml Dexamethasone, and aquatic therapy, Manual therapy.  All included unless contraindicated  PLAN FOR NEXT SESSION:  continue with exercises for ROM gains & strengthening,    Vladimir Faster, PT, DPT 12/04/2022, 12:38 PM

## 2022-12-09 ENCOUNTER — Encounter: Payer: Self-pay | Admitting: Physical Therapy

## 2022-12-09 ENCOUNTER — Ambulatory Visit: Payer: Medicare HMO | Admitting: Physical Therapy

## 2022-12-09 DIAGNOSIS — R600 Localized edema: Secondary | ICD-10-CM

## 2022-12-09 DIAGNOSIS — R262 Difficulty in walking, not elsewhere classified: Secondary | ICD-10-CM

## 2022-12-09 DIAGNOSIS — M25561 Pain in right knee: Secondary | ICD-10-CM | POA: Diagnosis not present

## 2022-12-09 DIAGNOSIS — G8929 Other chronic pain: Secondary | ICD-10-CM | POA: Diagnosis not present

## 2022-12-09 DIAGNOSIS — M25661 Stiffness of right knee, not elsewhere classified: Secondary | ICD-10-CM | POA: Diagnosis not present

## 2022-12-09 DIAGNOSIS — M6281 Muscle weakness (generalized): Secondary | ICD-10-CM

## 2022-12-09 NOTE — Therapy (Signed)
OUTPATIENT PHYSICAL THERAPY    Patient Name: Sierra Lucas MRN: [id number] DOB:May 08, 1955, 67 y.o., female Today's Date: 12/09/2022  END OF SESSION:  PT End of Session - 12/09/22 1356     Visit Number 4    Number of Visits 24    Date for PT Re-Evaluation 02/17/23    Authorization Type [employer] $25 copay    Progress Note Due on Visit 10    PT Start Time 1350    PT Stop Time 1430    PT Time Calculation (min) 40 min    Activity Tolerance Patient limited by pain    Behavior During Therapy [facility] for tasks assessed/performed                Past Medical History:  Diagnosis Date   Abnormal Pap smear 06/19/2005   ASCUS    Anemia    iron deficiency   Anxiety    Coronary artery disease    Fibromyalgia    GI (gastrointestinal bleed) 01/08/2017   Headache(784.0)    Heart murmur    since birth   HGSIL (high grade squamous intraepithelial dysplasia) 2005   Osteoarthritis of right knee    Systemic hypertension 10/11/2013   PATIENT DENIES   Past Surgical History:  Procedure Laterality Date   ABDOMINOPLASTY     after breast reduction   BLADDER SURGERY  02/10/2002   bladder sling   CARDIAC SURGERY  11/19/1998   CABG: LIMA-LAD   CHOLECYSTECTOMY  1981   CYSTECTOMY  06/11/2010   REMOVED ON BUTTOCKS    ESOPHAGOGASTRODUODENOSCOPY (EGD) WITH PROPOFOL Left 01/08/2017   Procedure: ESOPHAGOGASTRODUODENOSCOPY (EGD) WITH PROPOFOL;  Surgeon: Kerin Salen, MD;  Location: [facility] ENDOSCOPY;  Service: Gastroenterology;  Laterality: Left;   GASTRIC BYPASS  06/24/2012   INGUINAL HIDRADENITIS EXCISION  02/11/2011   NOVASURE ABLATION  07/18/2005   REDUCTION MAMMAPLASTY Bilateral    TOTAL KNEE ARTHROPLASTY Right 11/11/2022   Procedure: RIGHT TOTAL KNEE ARTHROPLASTY;  Surgeon: Kathryne Hitch, MD;  Location: [facility] OR;  Service: Orthopedics;  Laterality: Right;   TUBAL LIGATION     Patient Active Problem List   Diagnosis Date Noted   OA (osteoarthritis) of knee 11/11/2022   Status post  total right knee replacement 11/11/2022   Pain in left ankle and joints of left foot 07/05/2021   Knee pain 08/10/2017   Gastrointestinal hemorrhage with melena 01/07/2017   Acute blood loss anemia 01/07/2017   CAD (coronary artery disease) of artery bypass graft 10/11/2013   Systemic hypertension 10/11/2013   Hyperlipidemia 10/11/2013   Hidradenitis suppurativa 10/06/2011   Menopause 07/30/2010   Wears glasses 07/30/2010   Family history of breast cancer 07/30/2010    PCP: Gweneth Dimitri, MD  REFERRING PROVIDER: Kathryne Hitch, MD  REFERRING DIAG: [phone] (ICD-10-CM) - Status post total right knee replacement M17.11 (ICD-10-CM) - Primary osteoarthritis of right knee  THERAPY DIAG:  Chronic pain of right knee  Muscle weakness (generalized)  Stiffness of right knee, not elsewhere classified  Difficulty in walking, not elsewhere classified  Localized edema  Rationale for Evaluation and Treatment: Rehabilitation  ONSET DATE: 11/11/2022 Rt TKA surgery  SUBJECTIVE:   SUBJECTIVE STATEMENT: 12/09/2022 Pt arriving today reporting 2/10 pain in her Rt knee.    Eval:  Pt came to clinic s/p recent Rt TKA on 11/11/2022.   Pt indicated progressively getting better with pain over last week or so.  Pt indicated initially having 9/10 complaints at worst.  Today symptoms are 4/10.   Waking every  3 hours or so in bed.    Pt indicated doing some walking in house without devices but using furniture. Pt indicated having some complaints of lightheaded and dizziness at times since surgery. Pt indicated having complaints in Rt hip  [place]   PERTINENT HISTORY: CAD, fibromyalgia, HTN  PAIN:  NPRS scale: 2/10 upon arrival Pain location: Rt knee Pain description: tightness, ache  Aggravating factors: end ranges, WB activity, constant symptoms.  Relieving factors: ice/elevation  PRECAUTIONS: None  WEIGHT BEARING RESTRICTIONS:  No  FALLS:  Has patient fallen in last 6 months? Yes 2 with broken knee cap Rt knee Pt reported a fear of falling since surgery.   LIVING ENVIRONMENT: Lives with: lives with their spouse Lives in: House/apartment Stairs: bedroom on first, no steps outside.  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   PLOF: Independent, outdoor biking, walking.   PATIENT GOALS: Reduce pain, get back to walking  OBJECTIVE:   PATIENT SURVEYS:  11/25/2022 FOTO intake: 64   predicted:  74  COGNITION: 11/25/2022 Overall cognitive status: WFL    SENSATION: 11/25/2022 Not tested  EDEMA:  11/25/2022 Localized edema in Rt knee/Lower leg   MUSCLE LENGTH: 11/25/2022 No specific testing.   POSTURE:  11/25/2022 No Significant postural limitations  PALPATION: 11/25/2022 General tenderness around incision.   LOWER EXTREMITY ROM:   ROM Right 11/25/2022 Left 11/25/2022 Rt 12/02/22 Rt 12/09/22  Hip flexion      Hip extension      Hip abduction      Hip adduction      Hip internal rotation      Hip external rotation      Knee flexion 95 AROM in supine heel slide    A: 110 P: 115 A: P:  Knee extension -8 AROM in seated LAQ   A: -5 P: -2 A: P:  Ankle dorsiflexion      Ankle plantarflexion      Ankle inversion      Ankle eversion       (Blank rows = not tested)  LOWER EXTREMITY MMT:  MMT Right 11/25/2022 Left 11/25/2022  Hip flexion 4/5 5/5  Hip extension    Hip abduction    Hip adduction    Hip internal rotation    Hip external rotation    Knee flexion 4/5 5/5  Knee extension 4/5 5/5  Ankle dorsiflexion 5/5 5/5  Ankle plantarflexion    Ankle inversion    Ankle eversion     (Blank rows = not tested)  LOWER EXTREMITY SPECIAL TESTS:  11/25/2022 No specific testing.   FUNCTIONAL TESTS:  11/25/2022 18 inch chair transfer: able without hands but with high  deviation to Lt leg Lt SLS:  < 3 seconds Rt SLS: unable   TUG c FWW: 12.7 seconds TUG c SPC in Lt UE 14.2 seconds  GAIT: 11/25/2022 Ambulation c FWW within clinic, observed household distances.   Arrived c FWW.  Can perform SPC in Lt UE ambulation.  TODAY'S TREATMENT                                                                          DATE: 12/09/2022 Therex: Recumbent bike: level 4 x 8 minutes Leg Press: Bil LEs 81# 15 reps; Rt LE only 50# 2 x 10  Seated LAQ: 3 # 2 x 10 Standing step ups on 6 inch step: x 15 c UE support Standing calf raises: x 20 c UE support Standing hamstring curls: x 10 bil LE Mini squats tapping bottom on top of 20 inch mat table x 10  Modalities:  Vasopneumatic: medium compression, LE elevated, 34 deg Manual:  PROM knee flexion/extension     TODAY'S TREATMENT                                                                          DATE: 12/04/2022 Therex: Scifit: seat 7, full revolutions x 8 minutes;  PT verbal cues on riding home bike 10 min 2 sets with timed 5 min rest bw sets, reducing rest as able until 20 min straight. Precor bike seat 4 for 2 min rocking for gentle stretch, then full revolutions for 1 min to understand difference in lever arm of typical recumbent bike. Pt verbalized understanding. Leg Press: bil 56# x 20, Rt LE only 50# 2 x 10 Tandem stance on foam beam 30 sec RLE in front and in back with intermittent touch //bars Seated LAQ: 2 # 2 x 10 Seated heel slide on pillow case ext / quad set 5 sec hold and AROM knee flexion with assist LLE & hamstring set 5 sec 10 reps PT demo & verbal cues on technique on sit <> stand. 10 reps without UE support.  Supine SLR RLE 10 reps Supine heel slide with strap & 18" ball 10 reps  Modalities:  Vasopneumatic: medium compression, LE  elevated, 34 deg doing alphabet 2 sets   TREATMENT                                                                          DATE: 12/02/2022 Therex: Scifit: seat 7, full revolutions x 8 minutes Leg Press: bil 56# x 20, BLEs 81# 15 reps; Rt LE only 50# 2 x 10  Seated LAQ: 2 # 2 x 10 Seated AAROM knee flexion Supine quad sets x 10 Supine SLR: x20 Modalities:  Vasopneumatic: medium compression, LE elevated, 34 deg Manual:  PROM knee flexion/extension     TODAY'S TREATMENT  DATE: 11/25/2022 Therex:    HEP instruction/performance c cues for techniques, handout provided.  Trial set performed of each for comprehension and symptom assessment.  See below for exercise list  Vasopneumatic Rt knee 34 degrees medium compression in elevation 10 mins  PATIENT EDUCATION:  11/25/2022 Education details: HEP, POC Person educated: Patient Education method: Programmer, multimedia, Demonstration, Verbal cues, and Handouts Education comprehension: verbalized understanding, returned demonstration, and verbal cues required  HOME EXERCISE PROGRAM: Access Code: [facility] URL: [email or web address] Date: 11/25/2022 Prepared by: Chyrel Masson  Exercises - Supine Heel Slide  - 3-5 x daily - 7 x weekly - 1 sets - 10 reps - 5 hold - Supine Quadricep Sets  - 3-5 x daily - 7 x weekly - 1 sets - 10 reps - 5 hold - Supine Knee Extension Mobilization with Weight  - 4-5 x daily - 7 x weekly - 1 sets - 1 reps - to tolerance up to 15 mins hold - Seated Long Arc Quad  - 3-5 x daily - 7 x weekly - 1 sets - 5-10 reps - 2 hold - Seated Quad Set  - 3-5 x daily - 7 x weekly - 1 sets - 10 reps - 5 hold  ASSESSMENT:  CLINICAL IMPRESSION: Pt arriving today reporting 2/10 pain in her Rt knee. Pt tolerating exercises well. Pt with 115 degrees of flexion today still lacking 5 degrees extension. Continue to progress to maximize pt's function.      OBJECTIVE IMPAIRMENTS: Abnormal gait, decreased activity tolerance, decreased balance, decreased coordination, decreased endurance, decreased mobility, difficulty walking, decreased ROM, decreased strength, hypomobility, increased edema, increased fascial restrictions, impaired perceived functional ability, impaired flexibility, improper body mechanics, and pain.   ACTIVITY LIMITATIONS: carrying, lifting, bending, sitting, standing, squatting, sleeping, stairs, transfers, bed mobility, bathing, dressing, and locomotion level  PARTICIPATION LIMITATIONS: meal prep, cleaning, laundry, interpersonal relationship, driving, shopping, and community activity  PERSONAL FACTORS: CAD, fibromyalgia, HTN are also affecting patient's functional outcome.   REHAB POTENTIAL: Good  CLINICAL DECISION MAKING: Stable/uncomplicated  EVALUATION COMPLEXITY: Low   GOALS: Goals reviewed with patient? Yes  SHORT TERM GOALS: (target date for Short term goals are 3 weeks 12/16/2022)   1.  Patient will demonstrate independent use of home exercise program to maintain progress from in clinic treatments.  Goal status: New  LONG TERM GOALS: (target dates for all long term goals are 12 weeks  02/17/2023 )   1. Patient will demonstrate/report pain at worst less than or equal to 2/10 to facilitate minimal limitation in daily activity secondary to pain symptoms.  Goal status: New   2. Patient will demonstrate independent use of home exercise program to facilitate ability to maintain/progress functional gains from skilled physical therapy services.  Goal status: New   3. Patient will demonstrate FOTO outcome > or = 74 % to indicate reduced disability due to condition.  Goal status: New   4.  Patient will demonstrate Rt LE MMT 5/5 throughout to faciltiate usual transfers, stairs, squatting at [facility] for daily life.   Goal status: New   5.  Patient will demonstrate Rt knee AROM 0-110 deg to facilitate usual  transfers, ambulation, and other daily movement at PLOF.  Goal status: New   6.  Patient will demonstrate independent ambulation community distances > 300 ft for community integration.  Goal status: New   7.  Patient will demonstrate ascending/descending stairs reciprocally s UE assist for community integration.   Goal Status: New   PLAN:  PT FREQUENCY: 1-2x/week  PT DURATION: 12 weeks  PLANNED INTERVENTIONS: Therapeutic exercises, Therapeutic activity, Neuro Muscular re-education, Balance training, Gait training, Patient/Family education, Joint mobilization, Stair training, DME instructions, Dry Needling, Electrical stimulation, Traction, Cryotherapy, vasopneumatic deviceMoist heat, Taping, Ultrasound, Ionotophoresis 4mg /ml Dexamethasone, and aquatic therapy, Manual therapy.  All included unless contraindicated  PLAN FOR NEXT SESSION:  continue with exercises for ROM gains & strengthening, step ups    Sharmon Leyden, PT, MPT 12/09/2022, 2:15 PM

## 2022-12-11 ENCOUNTER — Encounter: Payer: Medicare HMO | Admitting: Rehabilitative and Restorative Service Providers"

## 2022-12-12 ENCOUNTER — Encounter: Payer: Self-pay | Admitting: Physical Therapy

## 2022-12-12 ENCOUNTER — Ambulatory Visit: Payer: Medicare HMO | Admitting: Physical Therapy

## 2022-12-12 DIAGNOSIS — M25561 Pain in right knee: Secondary | ICD-10-CM | POA: Diagnosis not present

## 2022-12-12 DIAGNOSIS — G8929 Other chronic pain: Secondary | ICD-10-CM

## 2022-12-12 DIAGNOSIS — R600 Localized edema: Secondary | ICD-10-CM | POA: Diagnosis not present

## 2022-12-12 DIAGNOSIS — R262 Difficulty in walking, not elsewhere classified: Secondary | ICD-10-CM

## 2022-12-12 DIAGNOSIS — M25661 Stiffness of right knee, not elsewhere classified: Secondary | ICD-10-CM

## 2022-12-12 DIAGNOSIS — M6281 Muscle weakness (generalized): Secondary | ICD-10-CM | POA: Diagnosis not present

## 2022-12-12 NOTE — Therapy (Signed)
OUTPATIENT PHYSICAL THERAPY    Patient Name: Sierra Lucas MRN: [id number] DOB:1955-03-26, 67 y.o., female Today's Date: 12/12/2022  END OF SESSION:  PT End of Session - 12/12/22 1443     Visit Number 5    Number of Visits 24    Date for PT Re-Evaluation 02/17/23    Authorization Type [employer] $25 copay    Progress Note Due on Visit 10    PT Start Time 1433    PT Stop Time 1511    PT Time Calculation (min) 38 min    Activity Tolerance Patient tolerated treatment well    Behavior During Therapy [facility] for tasks assessed/performed                 Past Medical History:  Diagnosis Date   Abnormal Pap smear 06/19/2005   ASCUS    Anemia    iron deficiency   Anxiety    Coronary artery disease    Fibromyalgia    GI (gastrointestinal bleed) 01/08/2017   Headache(784.0)    Heart murmur    since birth   HGSIL (high grade squamous intraepithelial dysplasia) 2005   Osteoarthritis of right knee    Systemic hypertension 10/11/2013   PATIENT DENIES   Past Surgical History:  Procedure Laterality Date   ABDOMINOPLASTY     after breast reduction   BLADDER SURGERY  02/10/2002   bladder sling   CARDIAC SURGERY  11/19/1998   CABG: LIMA-LAD   CHOLECYSTECTOMY  1981   CYSTECTOMY  06/11/2010   REMOVED ON BUTTOCKS    ESOPHAGOGASTRODUODENOSCOPY (EGD) WITH PROPOFOL Left 01/08/2017   Procedure: ESOPHAGOGASTRODUODENOSCOPY (EGD) WITH PROPOFOL;  Surgeon: Kerin Salen, MD;  Location: [facility] ENDOSCOPY;  Service: Gastroenterology;  Laterality: Left;   GASTRIC BYPASS  06/24/2012   INGUINAL HIDRADENITIS EXCISION  02/11/2011   NOVASURE ABLATION  07/18/2005   REDUCTION MAMMAPLASTY Bilateral    TOTAL KNEE ARTHROPLASTY Right 11/11/2022   Procedure: RIGHT TOTAL KNEE ARTHROPLASTY;  Surgeon: Kathryne Hitch, MD;  Location: [facility] OR;  Service: Orthopedics;  Laterality: Right;   TUBAL LIGATION     Patient Active Problem List   Diagnosis Date Noted   OA (osteoarthritis) of knee 11/11/2022    Status post total right knee replacement 11/11/2022   Pain in left ankle and joints of left foot 07/05/2021   Knee pain 08/10/2017   Gastrointestinal hemorrhage with melena 01/07/2017   Acute blood loss anemia 01/07/2017   CAD (coronary artery disease) of artery bypass graft 10/11/2013   Systemic hypertension 10/11/2013   Hyperlipidemia 10/11/2013   Hidradenitis suppurativa 10/06/2011   Menopause 07/30/2010   Wears glasses 07/30/2010   Family history of breast cancer 07/30/2010    PCP: Gweneth Dimitri, MD  REFERRING PROVIDER: Kathryne Hitch, MD  REFERRING DIAG: [phone] (ICD-10-CM) - Status post total right knee replacement M17.11 (ICD-10-CM) - Primary osteoarthritis of right knee  THERAPY DIAG:  Chronic pain of right knee  Muscle weakness (generalized)  Stiffness of right knee, not elsewhere classified  Difficulty in walking, not elsewhere classified  Localized edema  Rationale for Evaluation and Treatment: Rehabilitation  ONSET DATE: 11/11/2022 Rt TKA surgery  SUBJECTIVE:   SUBJECTIVE STATEMENT:  I went back to work this week, a dog got in my way and I stumbled over her and turned a funny way. Its been hurting since and this was my first day with no pain until this happened. I've been having more issues with hip pain that's been going on, its keeping me awake at  night now. Will speak with the MD about this.    Eval:  Pt came to clinic s/p recent Rt TKA on 11/11/2022.   Pt indicated progressively getting better with pain over last week or so.  Pt indicated initially having 9/10 complaints at worst.  Today symptoms are 4/10.   Waking every 3 hours or so in bed.    Pt indicated doing some walking in house without devices but using furniture. Pt indicated having some complaints of lightheaded and dizziness at times since surgery. Pt indicated having complaints in Rt hip  [place].   PERTINENT HISTORY: CAD,  fibromyalgia, HTN  PAIN:  NPRS scale: 5/10 Pain location: Rt knee Pain description: aching  Aggravating factors: end ranges, WB activity, constant.  Relieving factors: ice/elevation  PRECAUTIONS: None  WEIGHT BEARING RESTRICTIONS: No  FALLS:  Has patient fallen in last 6 months? Yes 2 with broken knee cap Rt knee Pt reported a fear of falling since surgery.   LIVING ENVIRONMENT: Lives with: lives with their spouse Lives in: House/apartment Stairs: bedroom on first, no steps outside.  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  Place has stairs to enter with 3 steps with railing.   PLOF: Independent, outdoor biking, walking.   PATIENT GOALS: Reduce pain, get back to walking  OBJECTIVE:   PATIENT SURVEYS:  11/25/2022 FOTO intake: 64   predicted:  74  COGNITION: 11/25/2022 Overall cognitive status: WFL    SENSATION: 11/25/2022 Not tested  EDEMA:  11/25/2022 Localized edema in Rt knee/Lower leg   MUSCLE LENGTH: 11/25/2022 No specific testing.   POSTURE:  11/25/2022 No Significant postural limitations  PALPATION: 11/25/2022 General tenderness around incision.   LOWER EXTREMITY ROM:   ROM Right 11/25/2022 Left 11/25/2022 Rt 12/02/22 Rt 12/09/22 R 12/12/22  Hip flexion       Hip extension       Hip abduction       Hip adduction       Hip internal rotation       Hip external rotation       Knee flexion 95 AROM in supine heel slide    A: 110 P: 115 A: P: AROM 115*  Knee extension -8 AROM in seated LAQ   A: -5 P: -2 A: P: -5* AROM  Ankle dorsiflexion       Ankle plantarflexion       Ankle inversion       Ankle eversion        (Blank rows = not tested)  LOWER EXTREMITY MMT:  MMT Right 11/25/2022 Left 11/25/2022  Hip flexion 4/5 5/5  Hip extension    Hip abduction    Hip adduction    Hip internal rotation    Hip external rotation    Knee flexion 4/5 5/5  Knee extension 4/5 5/5   Ankle dorsiflexion 5/5 5/5  Ankle plantarflexion    Ankle inversion    Ankle eversion     (Blank rows = not tested)  LOWER EXTREMITY SPECIAL TESTS:  11/25/2022 No specific testing.   FUNCTIONAL TESTS:  11/25/2022 18 inch chair transfer: able without hands but with high deviation to Lt leg Lt SLS:  < 3 seconds Rt SLS: unable   TUG c FWW: 12.7 seconds TUG c SPC in Lt UE 14.2 seconds  GAIT: 11/25/2022 Ambulation c FWW within clinic, observed household distances.   Arrived c  FWW.  Can perform SPC in Lt UE ambulation.                                                                                                                                                                        TODAY'S TREATMENT                                                                          DATE:   12/12/22  TherEx  Precor bike Seat 4x10 minutes for warmup/ROM Forward step ups 6 inch box x15  Forward step downs 4 inch box x12 HS curls 3# x10 R LE Standing hip ABD R LE 3# x15 Shuttle LE press 81# 2x15, R LE 50# 2x10 HS stretches 3x30 seconds ROM check Encouraged ice routine at home, also use of compression stockings if edema continues with return to work       12/09/2022 Therex: Recumbent bike: level 4 x 8 minutes Leg Press: Bil LEs 81# 15 reps; Rt LE only 50# 2 x 10  Seated LAQ: 3 # 2 x 10 Standing step ups on 6 inch step: x 15 c UE support Standing calf raises: x 20 c UE support Standing hamstring curls: x 10 bil LE Mini squats tapping bottom on top of 20 inch mat table x 10  Modalities:  Vasopneumatic: medium compression, LE elevated, 34 deg Manual:  PROM knee flexion/extension     TODAY'S TREATMENT                                                                          DATE: 12/04/2022 Therex: Scifit: seat 7, full revolutions x 8 minutes;  PT verbal cues on riding home bike 10 min 2 sets with timed 5 min rest bw sets, reducing rest as able until 20 min straight. Precor  bike seat 4 for 2 min rocking for gentle stretch, then full revolutions for 1 min to understand difference in lever arm of typical recumbent bike. Pt verbalized understanding. Leg Press: bil 56# x 20, Rt LE only 50# 2 x 10 Tandem stance on foam beam 30 sec RLE in front and in back with intermittent touch //bars Seated LAQ: 2 # 2  x 10 Seated heel slide on pillow case ext / quad set 5 sec hold and AROM knee flexion with assist LLE & hamstring set 5 sec 10 reps PT demo & verbal cues on technique on sit <> stand. 10 reps without UE support.  Supine SLR RLE 10 reps Supine heel slide with strap & 18" ball 10 reps  Modalities:  Vasopneumatic: medium compression, LE elevated, 34 deg doing alphabet 2 sets   TREATMENT                                                                          DATE: 12/02/2022 Therex: Scifit: seat 7, full revolutions x 8 minutes Leg Press: bil 56# x 20, BLEs 81# 15 reps; Rt LE only 50# 2 x 10  Seated LAQ: 2 # 2 x 10 Seated AAROM knee flexion Supine quad sets x 10 Supine SLR: x20 Modalities:  Vasopneumatic: medium compression, LE elevated, 34 deg Manual:  PROM knee flexion/extension     TODAY'S TREATMENT                                                                          DATE: 11/25/2022 Therex:    HEP instruction/performance c cues for techniques, handout provided.  Trial set performed of each for comprehension and symptom assessment.  See below for exercise list  Vasopneumatic Rt knee 34 degrees medium compression in elevation 10 mins  PATIENT EDUCATION:  11/25/2022 Education details: HEP, POC Person educated: Patient Education method: Programmer, multimedia, Demonstration, Verbal cues, and Handouts Education comprehension: verbalized understanding, returned demonstration, and verbal cues required  HOME EXERCISE PROGRAM: Access Code: [facility] URL: [email or web address] Date: 11/25/2022 Prepared by: Chyrel Masson  Exercises - Supine Heel  Slide  - 3-5 x daily - 7 x weekly - 1 sets - 10 reps - 5 hold - Supine Quadricep Sets  - 3-5 x daily - 7 x weekly - 1 sets - 10 reps - 5 hold - Supine Knee Extension Mobilization with Weight  - 4-5 x daily - 7 x weekly - 1 sets - 1 reps - to tolerance up to 15 mins hold - Seated Long Arc Quad  - 3-5 x daily - 7 x weekly - 1 sets - 5-10 reps - 2 hold - Seated Quad Set  - 3-5 x daily - 7 x weekly - 1 sets - 10 reps - 5 hold  ASSESSMENT:  CLINICAL IMPRESSION:  Crystalyn arrives today doing OK, had an incident where she twisted her knee a little bit when she stumbled over a dog at work and pain was a little higher today. Continued work on ROM and strength focus as able today, doing well overall. Pain improved to 0/10 at EOS.     OBJECTIVE IMPAIRMENTS: Abnormal gait, decreased activity tolerance, decreased balance, decreased coordination, decreased endurance, decreased mobility, difficulty walking, decreased ROM, decreased strength, hypomobility, increased edema, increased fascial restrictions, impaired perceived functional ability, impaired flexibility, improper body mechanics, and pain.  ACTIVITY LIMITATIONS: carrying, lifting, bending, sitting, standing, squatting, sleeping, stairs, transfers, bed mobility, bathing, dressing, and locomotion level  PARTICIPATION LIMITATIONS: meal prep, cleaning, laundry, interpersonal relationship, driving, shopping, and community activity  PERSONAL FACTORS: CAD, fibromyalgia, HTN are also affecting patient's functional outcome.   REHAB POTENTIAL: Good  CLINICAL DECISION MAKING: Stable/uncomplicated  EVALUATION COMPLEXITY: Low   GOALS: Goals reviewed with patient? Yes  SHORT TERM GOALS: (target date for Short term goals are 3 weeks 12/16/2022)   1.  Patient will demonstrate independent use of home exercise program to maintain progress from in clinic treatments.  Goal status: New  LONG TERM GOALS: (target dates for all long term goals are 12 weeks   02/17/2023 )   1. Patient will demonstrate/report pain at worst less than or equal to 2/10 to facilitate minimal limitation in daily activity secondary to pain symptoms.  Goal status: New   2. Patient will demonstrate independent use of home exercise program to facilitate ability to maintain/progress functional gains from skilled physical therapy services.  Goal status: New   3. Patient will demonstrate FOTO outcome > or = 74 % to indicate reduced disability due to condition.  Goal status: New   4.  Patient will demonstrate Rt LE MMT 5/5 throughout to faciltiate usual transfers, stairs, squatting at [facility] for daily life.   Goal status: New   5.  Patient will demonstrate Rt knee AROM 0-110 deg to facilitate usual transfers, ambulation, and other daily movement at PLOF.  Goal status: New   6.  Patient will demonstrate independent ambulation community distances > 300 ft for community integration.  Goal status: New   7.  Patient will demonstrate ascending/descending stairs reciprocally s UE assist for community integration.   Goal Status: New   PLAN:  PT FREQUENCY: 1-2x/week  PT DURATION: 12 weeks  PLANNED INTERVENTIONS: Therapeutic exercises, Therapeutic activity, Neuro Muscular re-education, Balance training, Gait training, Patient/Family education, Joint mobilization, Stair training, DME instructions, Dry Needling, Electrical stimulation, Traction, Cryotherapy, vasopneumatic deviceMoist heat, Taping, Ultrasound, Ionotophoresis 4mg /ml Dexamethasone, and aquatic therapy, Manual therapy.  All included unless contraindicated  PLAN FOR NEXT SESSION:  continue with exercises for ROM gains & strengthening, step ups with progressions as tolerated    Nedra Hai, PT, DPT 12/12/22 3:13 PM

## 2022-12-16 ENCOUNTER — Encounter: Payer: Self-pay | Admitting: Physical Therapy

## 2022-12-16 ENCOUNTER — Ambulatory Visit: Payer: Medicare HMO | Admitting: Physical Therapy

## 2022-12-16 ENCOUNTER — Encounter: Payer: Medicare HMO | Admitting: Rehabilitative and Restorative Service Providers"

## 2022-12-16 DIAGNOSIS — M25561 Pain in right knee: Secondary | ICD-10-CM | POA: Diagnosis not present

## 2022-12-16 DIAGNOSIS — M6281 Muscle weakness (generalized): Secondary | ICD-10-CM | POA: Diagnosis not present

## 2022-12-16 DIAGNOSIS — M25661 Stiffness of right knee, not elsewhere classified: Secondary | ICD-10-CM | POA: Diagnosis not present

## 2022-12-16 DIAGNOSIS — R600 Localized edema: Secondary | ICD-10-CM | POA: Diagnosis not present

## 2022-12-16 DIAGNOSIS — R262 Difficulty in walking, not elsewhere classified: Secondary | ICD-10-CM

## 2022-12-16 DIAGNOSIS — G8929 Other chronic pain: Secondary | ICD-10-CM | POA: Diagnosis not present

## 2022-12-16 NOTE — Therapy (Signed)
OUTPATIENT PHYSICAL THERAPY    Patient Name: Sierra Lucas MRN: [id number] DOB:1955/04/01, 67 y.o., female Today's Date: 12/16/2022  END OF SESSION:  PT End of Session - 12/16/22 1529     Visit Number 6    Number of Visits 24    Date for PT Re-Evaluation 02/17/23    Authorization Type [employer] $25 copay    Progress Note Due on Visit 10    PT Start Time 1430    PT Stop Time 1515    PT Time Calculation (min) 45 min    Activity Tolerance Patient tolerated treatment well    Behavior During Therapy [facility] for tasks assessed/performed                  Past Medical History:  Diagnosis Date   Abnormal Pap smear 06/19/2005   ASCUS    Anemia    iron deficiency   Anxiety    Coronary artery disease    Fibromyalgia    GI (gastrointestinal bleed) 01/08/2017   Headache(784.0)    Heart murmur    since birth   HGSIL (high grade squamous intraepithelial dysplasia) 2005   Osteoarthritis of right knee    Systemic hypertension 10/11/2013   PATIENT DENIES   Past Surgical History:  Procedure Laterality Date   ABDOMINOPLASTY     after breast reduction   BLADDER SURGERY  02/10/2002   bladder sling   CARDIAC SURGERY  11/19/1998   CABG: LIMA-LAD   CHOLECYSTECTOMY  1981   CYSTECTOMY  06/11/2010   REMOVED ON BUTTOCKS    ESOPHAGOGASTRODUODENOSCOPY (EGD) WITH PROPOFOL Left 01/08/2017   Procedure: ESOPHAGOGASTRODUODENOSCOPY (EGD) WITH PROPOFOL;  Surgeon: Kerin Salen, MD;  Location: [facility] ENDOSCOPY;  Service: Gastroenterology;  Laterality: Left;   GASTRIC BYPASS  06/24/2012   INGUINAL HIDRADENITIS EXCISION  02/11/2011   NOVASURE ABLATION  07/18/2005   REDUCTION MAMMAPLASTY Bilateral    TOTAL KNEE ARTHROPLASTY Right 11/11/2022   Procedure: RIGHT TOTAL KNEE ARTHROPLASTY;  Surgeon: Kathryne Hitch, MD;  Location: [facility] OR;  Service: Orthopedics;  Laterality: Right;   TUBAL LIGATION     Patient Active Problem List   Diagnosis Date Noted   OA (osteoarthritis) of knee 11/11/2022    Status post total right knee replacement 11/11/2022   Pain in left ankle and joints of left foot 07/05/2021   Knee pain 08/10/2017   Gastrointestinal hemorrhage with melena 01/07/2017   Acute blood loss anemia 01/07/2017   CAD (coronary artery disease) of artery bypass graft 10/11/2013   Systemic hypertension 10/11/2013   Hyperlipidemia 10/11/2013   Hidradenitis suppurativa 10/06/2011   Menopause 07/30/2010   Wears glasses 07/30/2010   Family history of breast cancer 07/30/2010    PCP: Gweneth Dimitri, MD  REFERRING PROVIDER: Kathryne Hitch, MD  REFERRING DIAG: [phone] (ICD-10-CM) - Status post total right knee replacement M17.11 (ICD-10-CM) - Primary osteoarthritis of right knee  THERAPY DIAG:  Chronic pain of right knee  Muscle weakness (generalized)  Stiffness of right knee, not elsewhere classified  Difficulty in walking, not elsewhere classified  Localized edema  Rationale for Evaluation and Treatment: Rehabilitation  ONSET DATE: 11/11/2022 Rt TKA surgery  SUBJECTIVE:   SUBJECTIVE STATEMENT: Pt arriving today reporting today is a "bad day"     Eval:  Pt came to clinic s/p recent Rt TKA on 11/11/2022.   Pt indicated progressively getting better with pain over last week or so.  Pt indicated initially having 9/10 complaints at worst.  Today symptoms are 4/10.   Waking  every 3 hours or so in bed.    Pt indicated doing some walking in house without devices but using furniture. Pt indicated having some complaints of lightheaded and dizziness at times since surgery. Pt indicated having complaints in Rt hip  [place].   PERTINENT HISTORY: CAD, fibromyalgia, HTN  PAIN:  NPRS scale: 5/10 Pain location: Rt knee Pain description: aching  Aggravating factors: end ranges, WB activity, constant.  Relieving factors: ice/elevation  PRECAUTIONS: None  WEIGHT BEARING RESTRICTIONS: No  FALLS:  Has patient fallen  in last 6 months? Yes 2 with broken knee cap Rt knee Pt reported a fear of falling since surgery.   LIVING ENVIRONMENT: Lives with: lives with their spouse Lives in: House/apartment Stairs: bedroom on first, no steps outside.  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  Place has stairs to enter with 3 steps with railing.   PLOF: Independent, outdoor biking, walking.   PATIENT GOALS: Reduce pain, get back to walking  OBJECTIVE:   PATIENT SURVEYS:  11/25/2022 FOTO intake: 64   predicted:  74  COGNITION: 11/25/2022 Overall cognitive status: WFL    SENSATION: 11/25/2022 Not tested  EDEMA:  11/25/2022 Localized edema in Rt knee/Lower leg   MUSCLE LENGTH: 11/25/2022 No specific testing.   POSTURE:  11/25/2022 No Significant postural limitations  PALPATION: 11/25/2022 General tenderness around incision.   LOWER EXTREMITY ROM:   ROM Right 11/25/2022 Left 11/25/2022 Rt 12/02/22 Rt 12/09/22 R 12/12/22 Rt 12/16/22  Hip flexion        Hip extension        Hip abduction        Hip adduction        Hip internal rotation        Hip external rotation        Knee flexion 95 AROM in supine heel slide    A: 110 P: 115 A: P: AROM 115* A: 120  Knee extension -8 AROM in seated LAQ   A: -5 P: -2 A: P: -5* AROM A: -2 P: 0  Ankle dorsiflexion        Ankle plantarflexion        Ankle inversion        Ankle eversion         (Blank rows = not tested)  LOWER EXTREMITY MMT:  MMT Right 11/25/2022 Left 11/25/2022  Hip flexion 4/5 5/5  Hip extension    Hip abduction    Hip adduction    Hip internal rotation    Hip external rotation    Knee flexion 4/5 5/5  Knee extension 4/5 5/5  Ankle dorsiflexion 5/5 5/5  Ankle plantarflexion    Ankle inversion    Ankle eversion     (Blank rows = not tested)  LOWER EXTREMITY SPECIAL TESTS:  11/25/2022 No specific testing.   FUNCTIONAL TESTS:  11/25/2022 18 inch  chair transfer: able without hands but with high deviation to Lt leg Lt SLS:  < 3 seconds Rt SLS: unable   TUG c FWW: 12.7 seconds TUG c SPC in Lt UE 14.2 seconds  GAIT: 11/25/2022 Ambulation c FWW within clinic, observed household distances.   Arrived c FWW.  Can perform SPC in Lt UE ambulation.  TODAY'S TREATMENT                                                                          DATE:  12/16/22:  Therex: Recumbent bike: level 4 x 8 minutes Leg Press: Bil LEs 81# 15 reps; Rt LE only 50# 2 x 10  Seated LAQ: 4 # 2 x 10 Standing lateral step ups on 6 inch step: x 15 c UE support Standing calf stretch: x 2 holding 30 sec TRX squats 2 x 10  Prone: knee hangs x 30 sec NMR:  Tandem walking in parallel bars x 4  Tandem walking on airex beam: x 4 c intermittent UE support Tandem side stepping on airex beam x 4 Modalities:  Vasopneumatic: medium compression, LE elevated, 34 deg      12/12/22  TherEx  Precor bike Seat 4x10 minutes for warmup/ROM Forward step ups 6 inch box x15  Forward step downs 4 inch box x12 HS curls 3# x10 R LE Standing hip ABD R LE 3# x15 Shuttle LE press 81# 2x15, R LE 50# 2x10 HS stretches 3x30 seconds ROM check Encouraged ice routine at home, also use of compression stockings if edema continues with return to work       12/09/2022 Therex: Recumbent bike: level 4 x 8 minutes Leg Press: Bil LEs 81# 15 reps; Rt LE only 50# 2 x 10  Seated LAQ: 3 # 2 x 10 Standing step ups on 6 inch step: x 15 c UE support Standing calf raises: x 20 c UE support Standing hamstring curls: x 10 bil LE Mini squats tapping bottom on top of 20 inch mat table x 10  Modalities:  Vasopneumatic: medium compression, LE elevated, 34 deg Manual:  PROM knee flexion/extension     TODAY'S TREATMENT                                                                           DATE: 12/04/2022 Therex: Scifit: seat 7, full revolutions x 8 minutes;  PT verbal cues on riding home bike 10 min 2 sets with timed 5 min rest bw sets, reducing rest as able until 20 min straight. Precor bike seat 4 for 2 min rocking for gentle stretch, then full revolutions for 1 min to understand difference in lever arm of typical recumbent bike. Pt verbalized understanding. Leg Press: bil 56# x 20, Rt LE only 50# 2 x 10 Tandem stance on foam beam 30 sec RLE in front and in back with intermittent touch //bars Seated LAQ: 2 # 2 x 10 Seated heel slide on pillow case ext / quad set 5 sec hold and AROM knee flexion with assist LLE & hamstring set 5 sec 10 reps PT demo & verbal cues on technique on sit <> stand. 10 reps without UE support.  Supine SLR RLE 10 reps Supine heel slide with strap & 18" ball 10 reps  Modalities:  Vasopneumatic: medium compression, LE elevated,  34 deg doing alphabet 2 sets          PATIENT EDUCATION:  11/25/2022 Education details: HEP, POC Person educated: Patient Education method: Explanation, Demonstration, Verbal cues, and Handouts Education comprehension: verbalized understanding, returned demonstration, and verbal cues required  HOME EXERCISE PROGRAM: Access Code: [facility] URL: [email or web address] Date: 11/25/2022 Prepared by: Chyrel Masson  Exercises - Supine Heel Slide  - 3-5 x daily - 7 x weekly - 1 sets - 10 reps - 5 hold - Supine Quadricep Sets  - 3-5 x daily - 7 x weekly - 1 sets - 10 reps - 5 hold - Supine Knee Extension Mobilization with Weight  - 4-5 x daily - 7 x weekly - 1 sets - 1 reps - to tolerance up to 15 mins hold - Seated Long Arc Quad  - 3-5 x daily - 7 x weekly - 1 sets - 5-10 reps - 2 hold - Seated Quad Set  - 3-5 x daily - 7 x weekly - 1 sets - 10 reps - 5 hold  ASSESSMENT:  CLINICAL IMPRESSION: Pt arriving today with 5/10 pain in her Rt  knee and reporting pain in her left knee and Rt hip. Pt stating she is scheduled to see Dr. Magnus Ivan about her hip and knee next week. Pt's right knee active ROM is 120 degrees flexion.  Recommend continue skilled PT per pt's POC.      OBJECTIVE IMPAIRMENTS: Abnormal gait, decreased activity tolerance, decreased balance, decreased coordination, decreased endurance, decreased mobility, difficulty walking, decreased ROM, decreased strength, hypomobility, increased edema, increased fascial restrictions, impaired perceived functional ability, impaired flexibility, improper body mechanics, and pain.   ACTIVITY LIMITATIONS: carrying, lifting, bending, sitting, standing, squatting, sleeping, stairs, transfers, bed mobility, bathing, dressing, and locomotion level  PARTICIPATION LIMITATIONS: meal prep, cleaning, laundry, interpersonal relationship, driving, shopping, and community activity  PERSONAL FACTORS: CAD, fibromyalgia, HTN are also affecting patient's functional outcome.   REHAB POTENTIAL: Good  CLINICAL DECISION MAKING: Stable/uncomplicated  EVALUATION COMPLEXITY: Low   GOALS: Goals reviewed with patient? Yes  SHORT TERM GOALS: (target date for Short term goals are 3 weeks 12/16/2022)   1.  Patient will demonstrate independent use of home exercise program to maintain progress from in clinic treatments.  Goal status: MET 12/16/22  LONG TERM GOALS: (target dates for all long term goals are 12 weeks  02/17/2023 )   1. Patient will demonstrate/report pain at worst less than or equal to 2/10 to facilitate minimal limitation in daily activity secondary to pain symptoms.  Goal status: New   2. Patient will demonstrate independent use of home exercise program to facilitate ability to maintain/progress functional gains from skilled physical therapy services.  Goal status: New   3. Patient will demonstrate FOTO outcome > or = 74 % to indicate reduced disability due to condition.  Goal  status: New   4.  Patient will demonstrate Rt LE MMT 5/5 throughout to faciltiate usual transfers, stairs, squatting at [facility] for daily life.   Goal status: New   5.  Patient will demonstrate Rt knee AROM 0-110 deg to facilitate usual transfers, ambulation, and other daily movement at PLOF.  Goal status:  MET 12/16/22   6.  Patient will demonstrate independent ambulation community distances > 300 ft for community integration.  Goal status: New   7.  Patient will demonstrate ascending/descending stairs reciprocally s UE assist for community integration.   Goal Status: New   PLAN:  PT FREQUENCY: 1-2x/week  PT DURATION: 12 weeks  PLANNED INTERVENTIONS: Therapeutic exercises, Therapeutic activity, Neuro Muscular re-education, Balance training, Gait training, Patient/Family education, Joint mobilization, Stair training, DME instructions, Dry Needling, Electrical stimulation, Traction, Cryotherapy, vasopneumatic deviceMoist heat, Taping, Ultrasound, Ionotophoresis 4mg /ml Dexamethasone, and aquatic therapy, Manual therapy.  All included unless contraindicated  PLAN FOR NEXT SESSION:  continue with exercises for ROM gains & strengthening, step ups with progressions as tolerated    Narda Amber, PT, MPT 12/16/22 3:33 PM   12/16/22 3:33 PM

## 2022-12-16 NOTE — Therapy (Deleted)
OUTPATIENT PHYSICAL THERAPY    Patient Name: LINZIE CRISS MRN: [id number] DOB:Jun 04, 1955, 67 y.o., female Today's Date: 12/16/2022  END OF SESSION:        Past Medical History:  Diagnosis Date   Abnormal Pap smear 06/19/2005   ASCUS    Anemia    iron deficiency   Anxiety    Coronary artery disease    Fibromyalgia    GI (gastrointestinal bleed) 01/08/2017   Headache(784.0)    Heart murmur    since birth   HGSIL (high grade squamous intraepithelial dysplasia) 2005   Osteoarthritis of right knee    Systemic hypertension 10/11/2013   PATIENT DENIES   Past Surgical History:  Procedure Laterality Date   ABDOMINOPLASTY     after breast reduction   BLADDER SURGERY  02/10/2002   bladder sling   CARDIAC SURGERY  11/19/1998   CABG: LIMA-LAD   CHOLECYSTECTOMY  1981   CYSTECTOMY  06/11/2010   REMOVED ON BUTTOCKS    ESOPHAGOGASTRODUODENOSCOPY (EGD) WITH PROPOFOL Left 01/08/2017   Procedure: ESOPHAGOGASTRODUODENOSCOPY (EGD) WITH PROPOFOL;  Surgeon: Kerin Salen, MD;  Location: [facility] ENDOSCOPY;  Service: Gastroenterology;  Laterality: Left;   GASTRIC BYPASS  06/24/2012   INGUINAL HIDRADENITIS EXCISION  02/11/2011   NOVASURE ABLATION  07/18/2005   REDUCTION MAMMAPLASTY Bilateral    TOTAL KNEE ARTHROPLASTY Right 11/11/2022   Procedure: RIGHT TOTAL KNEE ARTHROPLASTY;  Surgeon: Kathryne Hitch, MD;  Location: [facility] OR;  Service: Orthopedics;  Laterality: Right;   TUBAL LIGATION     Patient Active Problem List   Diagnosis Date Noted   OA (osteoarthritis) of knee 11/11/2022   Status post total right knee replacement 11/11/2022   Pain in left ankle and joints of left foot 07/05/2021   Knee pain 08/10/2017   Gastrointestinal hemorrhage with melena 01/07/2017   Acute blood loss anemia 01/07/2017   CAD (coronary artery disease) of artery bypass graft 10/11/2013   Systemic hypertension 10/11/2013   Hyperlipidemia 10/11/2013   Hidradenitis suppurativa 10/06/2011   Menopause  07/30/2010   Wears glasses 07/30/2010   Family history of breast cancer 07/30/2010    PCP: Gweneth Dimitri, MD  REFERRING PROVIDER: Kathryne Hitch, MD  REFERRING DIAG: [phone] (ICD-10-CM) - Status post total right knee replacement M17.11 (ICD-10-CM) - Primary osteoarthritis of right knee  THERAPY DIAG:  No diagnosis found.  Rationale for Evaluation and Treatment: Rehabilitation  ONSET DATE: 11/11/2022 Rt TKA surgery  SUBJECTIVE:   SUBJECTIVE STATEMENT: ***   I went back to work this week, a dog got in my way and I stumbled over her and turned a funny way. Its been hurting since and this was my first day with no pain until this happened. I've been having more issues with hip pain that's been going on, its keeping me awake at night now. Will speak with the MD about this.    Eval:  Pt came to clinic s/p recent Rt TKA on 11/11/2022.   Pt indicated progressively getting better with pain over last week or so.  Pt indicated initially having 9/10 complaints at worst.  Today symptoms are 4/10.   Waking every 3 hours or so in bed.    Pt indicated doing some walking in house without devices but using furniture. Pt indicated having some complaints of lightheaded and dizziness at times since surgery. Pt indicated having complaints in Rt hip  [place]   PERTINENT HISTORY: CAD, fibromyalgia, HTN  PAIN:  NPRS  scale: ***   5/10 Pain location: Rt knee Pain description: aching  Aggravating factors: end ranges, WB activity, constant.  Relieving factors: ice/elevation  PRECAUTIONS: None  WEIGHT BEARING RESTRICTIONS: No  FALLS:  Has patient fallen in last 6 months? Yes 2 with broken knee cap Rt knee Pt reported a fear of falling since surgery.   LIVING ENVIRONMENT: Lives with: lives with their spouse Lives in: House/apartment Stairs: bedroom on first, no steps outside.  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  Place has stairs to enter with 3 steps with railing.   PLOF: Independent, outdoor biking, walking.   PATIENT GOALS: Reduce pain, get back to walking  OBJECTIVE:   PATIENT SURVEYS:  11/25/2022 FOTO intake: 64   predicted:  74  COGNITION: 11/25/2022 Overall cognitive status: WFL    SENSATION: 11/25/2022 Not tested  EDEMA:  11/25/2022 Localized edema in Rt knee/Lower leg   MUSCLE LENGTH: 11/25/2022 No specific testing.   POSTURE:  11/25/2022 No Significant postural limitations  PALPATION: 11/25/2022 General tenderness around incision.   LOWER EXTREMITY ROM:   ROM Right 11/25/2022 Left 11/25/2022 Rt 12/02/22 Rt 12/09/22 R 12/12/22  Hip flexion       Hip extension       Hip abduction       Hip adduction       Hip internal rotation       Hip external rotation       Knee flexion 95 AROM in supine heel slide    A: 110 P: 115 A: P: AROM 115*  Knee extension -8 AROM in seated LAQ   A: -5 P: -2 A: P: -5* AROM  Ankle dorsiflexion       Ankle plantarflexion       Ankle inversion       Ankle eversion        (Blank rows = not tested)  LOWER EXTREMITY MMT:  MMT Right 11/25/2022 Left 11/25/2022  Hip flexion 4/5 5/5  Hip extension    Hip abduction    Hip adduction    Hip internal rotation    Hip external rotation    Knee flexion 4/5 5/5  Knee extension 4/5 5/5  Ankle dorsiflexion 5/5 5/5  Ankle plantarflexion    Ankle inversion    Ankle eversion     (Blank rows = not tested)  LOWER EXTREMITY SPECIAL TESTS:  11/25/2022 No specific testing.   FUNCTIONAL TESTS:  11/25/2022 18 inch chair transfer: able without hands but with high deviation to Lt leg Lt SLS:  < 3 seconds Rt SLS: unable   TUG c FWW: 12.7 seconds TUG c SPC in Lt UE 14.2 seconds  GAIT: 11/25/2022 Ambulation c FWW within clinic, observed household distances.   Arrived c FWW.  Can perform SPC in Lt UE ambulation.  TODAY'S TREATMENT                                                                          DATE:  12/16/2022: Therapeutic Exercise: ***  12/12/22  TherEx  Precor bike Seat 4x10 minutes for warmup/ROM Forward step ups 6 inch box x15  Forward step downs 4 inch box x12 HS curls 3# x10 R LE Standing hip ABD R LE 3# x15 Shuttle LE press 81# 2x15, R LE 50# 2x10 HS stretches 3x30 seconds ROM check Encouraged ice routine at home, also use of compression stockings if edema continues with return to work       12/09/2022 Therex: Recumbent bike: level 4 x 8 minutes Leg Press: Bil LEs 81# 15 reps; Rt LE only 50# 2 x 10  Seated LAQ: 3 # 2 x 10 Standing step ups on 6 inch step: x 15 c UE support Standing calf raises: x 20 c UE support Standing hamstring curls: x 10 bil LE Mini squats tapping bottom on top of 20 inch mat table x 10  Modalities:  Vasopneumatic: medium compression, LE elevated, 34 deg Manual:  PROM knee flexion/extension     TREATMENT                                                                          DATE: 12/04/2022 Therex: Scifit: seat 7, full revolutions x 8 minutes;  PT verbal cues on riding home bike 10 min 2 sets with timed 5 min rest bw sets, reducing rest as able until 20 min straight. Precor bike seat 4 for 2 min rocking for gentle stretch, then full revolutions for 1 min to understand difference in lever arm of typical recumbent bike. Pt verbalized understanding. Leg Press: bil 56# x 20, Rt LE only 50# 2 x 10 Tandem stance on foam beam 30 sec RLE in front and in back with intermittent touch //bars Seated LAQ: 2 # 2 x 10 Seated heel slide on pillow case ext / quad set 5 sec hold and AROM knee flexion with assist LLE & hamstring set 5 sec 10 reps PT demo & verbal cues on technique on sit <> stand. 10 reps without UE  support.  Supine SLR RLE 10 reps Supine heel slide with strap & 18" ball 10 reps  Modalities:  Vasopneumatic: medium compression, LE elevated, 34 deg doing alphabet 2 sets    PATIENT EDUCATION:  11/25/2022 Education details: HEP, POC Person educated: Patient Education method: Programmer, multimedia, Demonstration, Verbal cues, and Handouts Education comprehension: verbalized understanding, returned demonstration, and verbal cues required  HOME EXERCISE PROGRAM: Access Code: [facility] URL: [email or web address] Date: 11/25/2022 Prepared by: Chyrel Masson  Exercises - Supine Heel Slide  - 3-5 x daily - 7 x weekly - 1 sets - 10 reps - 5 hold - Supine Quadricep Sets  - 3-5 x daily - 7 x weekly - 1 sets - 10 reps - 5 hold - Supine Knee Extension Mobilization  with Weight  - 4-5 x daily - 7 x weekly - 1 sets - 1 reps - to tolerance up to 15 mins hold - Seated Long Arc Quad  - 3-5 x daily - 7 x weekly - 1 sets - 5-10 reps - 2 hold - Seated Quad Set  - 3-5 x daily - 7 x weekly - 1 sets - 10 reps - 5 hold  ASSESSMENT:  CLINICAL IMPRESSION: ***  Naika arrives today doing OK, had an incident where she twisted her knee a little bit when she stumbled over a dog at work and pain was a little higher today. Continued work on ROM and strength focus as able today, doing well overall. Pain improved to 0/10 at EOS.   OBJECTIVE IMPAIRMENTS: Abnormal gait, decreased activity tolerance, decreased balance, decreased coordination, decreased endurance, decreased mobility, difficulty walking, decreased ROM, decreased strength, hypomobility, increased edema, increased fascial restrictions, impaired perceived functional ability, impaired flexibility, improper body mechanics, and pain.   ACTIVITY LIMITATIONS: carrying, lifting, bending, sitting, standing, squatting, sleeping, stairs, transfers, bed mobility, bathing, dressing, and locomotion level  PARTICIPATION LIMITATIONS: meal prep, cleaning, laundry,  interpersonal relationship, driving, shopping, and community activity  PERSONAL FACTORS: CAD, fibromyalgia, HTN are also affecting patient's functional outcome.   REHAB POTENTIAL: Good  CLINICAL DECISION MAKING: Stable/uncomplicated  EVALUATION COMPLEXITY: Low   GOALS: Goals reviewed with patient? Yes  SHORT TERM GOALS: (target date for Short term goals are 3 weeks 12/16/2022)   1.  Patient will demonstrate independent use of home exercise program to maintain progress from in clinic treatments.  Goal status: New  LONG TERM GOALS: (target dates for all long term goals are 12 weeks  02/17/2023 )   1. Patient will demonstrate/report pain at worst less than or equal to 2/10 to facilitate minimal limitation in daily activity secondary to pain symptoms.  Goal status: New   2. Patient will demonstrate independent use of home exercise program to facilitate ability to maintain/progress functional gains from skilled physical therapy services.  Goal status: New   3. Patient will demonstrate FOTO outcome > or = 74 % to indicate reduced disability due to condition.  Goal status: New   4.  Patient will demonstrate Rt LE MMT 5/5 throughout to faciltiate usual transfers, stairs, squatting at [facility] for daily life.   Goal status: New   5.  Patient will demonstrate Rt knee AROM 0-110 deg to facilitate usual transfers, ambulation, and other daily movement at PLOF.  Goal status: New   6.  Patient will demonstrate independent ambulation community distances > 300 ft for community integration.  Goal status: New   7.  Patient will demonstrate ascending/descending stairs reciprocally s UE assist for community integration.   Goal Status: New   PLAN:  PT FREQUENCY: 1-2x/week  PT DURATION: 12 weeks  PLANNED INTERVENTIONS: Therapeutic exercises, Therapeutic activity, Neuro Muscular re-education, Balance training, Gait training, Patient/Family education, Joint mobilization, Stair training, DME  instructions, Dry Needling, Electrical stimulation, Traction, Cryotherapy, vasopneumatic deviceMoist heat, Taping, Ultrasound, Ionotophoresis 4mg /ml Dexamethasone, and aquatic therapy, Manual therapy.  All included unless contraindicated  PLAN FOR NEXT SESSION:  ***  continue with exercises for ROM gains & strengthening, step ups with progressions as tolerated     Vladimir Faster, PT, DPT 12/16/2022, 7:57 AM

## 2022-12-17 ENCOUNTER — Other Ambulatory Visit: Payer: Self-pay | Admitting: Orthopaedic Surgery

## 2022-12-17 ENCOUNTER — Telehealth: Payer: Self-pay | Admitting: *Deleted

## 2022-12-17 MED ORDER — TIZANIDINE HCL 4 MG PO TABS: 4.0000 mg | ORAL_TABLET | Freq: Four times a day (QID) | ORAL | 0 refills | Status: AC | PRN

## 2022-12-17 MED ORDER — OXYCODONE HCL 5 MG PO TABS: 5.0000 mg | ORAL_TABLET | Freq: Three times a day (TID) | ORAL | 0 refills | Status: AC | PRN

## 2022-12-17 NOTE — Telephone Encounter (Signed)
Patient called in stating she was doing extremely well, but all of a sudden has started having spasming into thigh and hip/groin extremely painful. Has had to return to pain medication and is out of muscle relaxer. Requested refill of both please. She reports having a previous injection into the hip some time ago. Just over 30 days post op from Right knee. Morrie Sheldon got her in with Bronson Curb for tomorrow afternoon. Thank you.

## 2022-12-18 ENCOUNTER — Other Ambulatory Visit (INDEPENDENT_AMBULATORY_CARE_PROVIDER_SITE_OTHER): Payer: Medicare HMO

## 2022-12-18 ENCOUNTER — Encounter: Payer: Medicare HMO | Admitting: Rehabilitative and Restorative Service Providers"

## 2022-12-18 ENCOUNTER — Ambulatory Visit (INDEPENDENT_AMBULATORY_CARE_PROVIDER_SITE_OTHER): Payer: Medicare HMO | Admitting: Physician Assistant

## 2022-12-18 DIAGNOSIS — M7061 Trochanteric bursitis, right hip: Secondary | ICD-10-CM

## 2022-12-18 DIAGNOSIS — M25551 Pain in right hip: Secondary | ICD-10-CM | POA: Diagnosis not present

## 2022-12-18 DIAGNOSIS — Z96651 Presence of right artificial knee joint: Secondary | ICD-10-CM

## 2022-12-18 MED ORDER — METHYLPREDNISOLONE ACETATE 40 MG/ML IJ SUSP
40.0000 mg | INTRAMUSCULAR | Status: AC | PRN
Start: 2022-12-18 — End: 2022-12-18
  Administered 2022-12-18: 40 mg via INTRA_ARTICULAR

## 2022-12-18 MED ORDER — LIDOCAINE HCL 1 % IJ SOLN
3.0000 mL | INTRAMUSCULAR | Status: AC | PRN
Start: 2022-12-18 — End: 2022-12-18
  Administered 2022-12-18: 3 mL

## 2022-12-18 NOTE — Progress Notes (Addendum)
HPI: Sierra Lucas returns today status post right total knee arthroplasty 11/11/2022.  States that her knee is doing well.  She however states she is having pain right hip that started this Saturday no known injury.  She states the pain is awful when she cannot function.  No real groin pain.  Pain is mostly lateral aspect of the hip does radiate somewhat to the anterior aspect of the hip though.  No numbness tingling down the leg.  She has had trochanteric bursitis in the past and was had previous injections which were helpful.  Review of systems: See HPI otherwise negative or noncontributory.  Physical exam: General Well-developed well-nourished female ambulates without any assistive device.  Right hip: Good range of motion of the right hip without significant pain.  Tenderness over the right trochanteric region. Right knee full extension flexion to 100 1520 degrees.  No instability valgus varus stressing calf supple nontender.  Surgical incisions well-healed.  Radiographs: AP pelvis lateral view of the right hip: Right hip is well located.  No acute fractures acute findings.  Hip joint well-maintained.  Impression: Status post right total knee arthroplasty 11/11/2022 Right hip trochanteric bursitis  Plan: Recommended IT band stretching and also cortisone injections in the right hip she was agreeable.  She will continue to work on range of motion strengthening of the right knee.  Work on Social research officer, government.  Will see her back in April of next year that time obtain AP and lateral view of her right knee.  Questions were encouraged and answered by Dr. Raye Sorrow myself.  She is moving to [place] this year.  However she does states she would like to maintain her established relationship here at our office.     Procedure Note  Patient: Sierra Lucas             Date of Birth: Sep 09, 1955           MRN: [id number]             Visit Date: 12/18/2022  Procedures: Visit Diagnoses:  1. Pain in  right hip   2. Trochanteric bursitis, right hip   3. Status post total right knee replacement     Large Joint Inj: R greater trochanter on 12/18/2022 5:29 PM Details: 22 G 1.5 in needle, lateral approach  Arthrogram: No  Medications: 3 mL lidocaine 1 %; 40 mg methylPREDNISolone acetate 40 MG/ML Outcome: tolerated well, no immediate complications Procedure, treatment alternatives, risks and benefits explained, specific risks discussed. Consent was given by the patient. Immediately prior to procedure a time out was called to verify the correct patient, procedure, equipment, support staff and site/side marked as required. Patient was prepped and draped in the usual sterile fashion.

## 2022-12-19 ENCOUNTER — Ambulatory Visit: Payer: Medicare HMO | Admitting: Physical Therapy

## 2022-12-19 ENCOUNTER — Encounter: Payer: Self-pay | Admitting: Physical Therapy

## 2022-12-19 DIAGNOSIS — R262 Difficulty in walking, not elsewhere classified: Secondary | ICD-10-CM

## 2022-12-19 DIAGNOSIS — M25661 Stiffness of right knee, not elsewhere classified: Secondary | ICD-10-CM

## 2022-12-19 DIAGNOSIS — M25561 Pain in right knee: Secondary | ICD-10-CM

## 2022-12-19 DIAGNOSIS — G8929 Other chronic pain: Secondary | ICD-10-CM

## 2022-12-19 DIAGNOSIS — R600 Localized edema: Secondary | ICD-10-CM

## 2022-12-19 DIAGNOSIS — M6281 Muscle weakness (generalized): Secondary | ICD-10-CM

## 2022-12-19 NOTE — Therapy (Signed)
Pt arrived reporting that she got a cortisone shot in her hip yesterday, it is still very sore and painful. Also reports that that provider recommended she take it easy until Saturday and can then return to activity.   Educated that it is common for exercise and PT to be held for 24-48 hours (depending on MD) after this shot to allow medicine to take full effect and not be pushed out of the area.  Held PT out of caution today, will plan on resuming normal interventions at next scheduled visit.   Offered ice to hip but she politely declined. Will plan on resuming regular interventions at next scheduled PT visit.   Nedra Hai, PT, DPT 12/19/22 2:44 PM

## 2022-12-22 ENCOUNTER — Encounter: Payer: Medicare HMO | Admitting: Orthopaedic Surgery

## 2022-12-22 ENCOUNTER — Encounter: Payer: Self-pay | Admitting: Physical Therapy

## 2022-12-22 ENCOUNTER — Ambulatory Visit (INDEPENDENT_AMBULATORY_CARE_PROVIDER_SITE_OTHER): Payer: Medicare HMO | Admitting: Physical Therapy

## 2022-12-22 DIAGNOSIS — M25561 Pain in right knee: Secondary | ICD-10-CM

## 2022-12-22 DIAGNOSIS — R262 Difficulty in walking, not elsewhere classified: Secondary | ICD-10-CM | POA: Diagnosis not present

## 2022-12-22 DIAGNOSIS — R600 Localized edema: Secondary | ICD-10-CM | POA: Diagnosis not present

## 2022-12-22 DIAGNOSIS — M25661 Stiffness of right knee, not elsewhere classified: Secondary | ICD-10-CM

## 2022-12-22 DIAGNOSIS — G8929 Other chronic pain: Secondary | ICD-10-CM

## 2022-12-22 DIAGNOSIS — M6281 Muscle weakness (generalized): Secondary | ICD-10-CM | POA: Diagnosis not present

## 2022-12-22 NOTE — Therapy (Signed)
OUTPATIENT PHYSICAL THERAPY    Patient Name: Sierra Lucas MRN: [id number] DOB:04/04/1955, 67 y.o., female Today's Date: 12/22/2022  END OF SESSION:  PT End of Session - 12/22/22 1200     Visit Number 7    Date for PT Re-Evaluation 02/17/23    Authorization Type [employer] $25 copay    Progress Note Due on Visit 10    PT Start Time 1145    PT Stop Time 1230    PT Time Calculation (min) 45 min    Activity Tolerance Patient tolerated treatment well    Behavior During Therapy [facility] for tasks assessed/performed                  Past Medical History:  Diagnosis Date   Abnormal Pap smear 06/19/2005   ASCUS    Anemia    iron deficiency   Anxiety    Coronary artery disease    Fibromyalgia    GI (gastrointestinal bleed) 01/08/2017   Headache(784.0)    Heart murmur    since birth   HGSIL (high grade squamous intraepithelial dysplasia) 2005   Osteoarthritis of right knee    Systemic hypertension 10/11/2013   PATIENT DENIES   Past Surgical History:  Procedure Laterality Date   ABDOMINOPLASTY     after breast reduction   BLADDER SURGERY  02/10/2002   bladder sling   CARDIAC SURGERY  11/19/1998   CABG: LIMA-LAD   CHOLECYSTECTOMY  1981   CYSTECTOMY  06/11/2010   REMOVED ON BUTTOCKS    ESOPHAGOGASTRODUODENOSCOPY (EGD) WITH PROPOFOL Left 01/08/2017   Procedure: ESOPHAGOGASTRODUODENOSCOPY (EGD) WITH PROPOFOL;  Surgeon: Kerin Salen, MD;  Location: [facility] ENDOSCOPY;  Service: Gastroenterology;  Laterality: Left;   GASTRIC BYPASS  06/24/2012   INGUINAL HIDRADENITIS EXCISION  02/11/2011   NOVASURE ABLATION  07/18/2005   REDUCTION MAMMAPLASTY Bilateral    TOTAL KNEE ARTHROPLASTY Right 11/11/2022   Procedure: RIGHT TOTAL KNEE ARTHROPLASTY;  Surgeon: Kathryne Hitch, MD;  Location: [facility] OR;  Service: Orthopedics;  Laterality: Right;   TUBAL LIGATION     Patient Active Problem List   Diagnosis Date Noted   OA (osteoarthritis) of knee 11/11/2022   Status post total right  knee replacement 11/11/2022   Pain in left ankle and joints of left foot 07/05/2021   Knee pain 08/10/2017   Gastrointestinal hemorrhage with melena 01/07/2017   Acute blood loss anemia 01/07/2017   CAD (coronary artery disease) of artery bypass graft 10/11/2013   Systemic hypertension 10/11/2013   Hyperlipidemia 10/11/2013   Hidradenitis suppurativa 10/06/2011   Menopause 07/30/2010   Wears glasses 07/30/2010   Family history of breast cancer 07/30/2010    PCP: Gweneth Dimitri, MD  REFERRING PROVIDER: Kathryne Hitch, MD  REFERRING DIAG: [phone] (ICD-10-CM) - Status post total right knee replacement M17.11 (ICD-10-CM) - Primary osteoarthritis of right knee  THERAPY DIAG:  Chronic pain of right knee  Muscle weakness (generalized)  Stiffness of right knee, not elsewhere classified  Difficulty in walking, not elsewhere classified  Localized edema  Rationale for Evaluation and Treatment: Rehabilitation  ONSET DATE: 11/11/2022 Rt TKA surgery  SUBJECTIVE:   SUBJECTIVE STATEMENT: Pt stating she was released by Dr. Magnus Ivan on Friday. Pt also reporting having a Rt hip injection which has helped significantly.     Eval:  Pt came to clinic s/p recent Rt TKA on 11/11/2022.   Pt indicated progressively getting better with pain over last week or so.  Pt indicated initially having 9/10 complaints at worst.  Today  symptoms are 4/10.   Waking every 3 hours or so in bed.    Pt indicated doing some walking in house without devices but using furniture. Pt indicated having some complaints of lightheaded and dizziness at times since surgery. Pt indicated having complaints in Rt hip  Pt indicated planning to move to the beach around Jan 2025.   PERTINENT HISTORY: CAD, fibromyalgia, HTN  PAIN:  NPRS scale: 4/10 more stiffness noted Pain location: Rt knee Pain description: aching  Aggravating factors: end ranges, WB activity, constant.  Relieving factors:  ice/elevation  PRECAUTIONS: None  WEIGHT BEARING RESTRICTIONS: No  FALLS:  Has patient fallen in last 6 months? Yes 2 with broken knee cap Rt knee Pt reported a fear of falling since surgery.   LIVING ENVIRONMENT: Lives with: lives with their spouse Lives in: House/apartment Stairs: bedroom on first, no steps outside.  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  Place has stairs to enter with 3 steps with railing.   PLOF: Independent, outdoor biking, walking.   PATIENT GOALS: Reduce pain, get back to walking  OBJECTIVE:   PATIENT SURVEYS:  11/25/2022 FOTO intake: 64   predicted:  74  COGNITION: 11/25/2022 Overall cognitive status: WFL    SENSATION: 11/25/2022 Not tested  EDEMA:  11/25/2022 Localized edema in Rt knee/Lower leg   MUSCLE LENGTH: 11/25/2022 No specific testing.   POSTURE:  11/25/2022 No Significant postural limitations  PALPATION: 11/25/2022 General tenderness around incision.   LOWER EXTREMITY ROM:   ROM Right 11/25/2022 Left 11/25/2022 Rt 12/02/22 Rt 12/09/22 R 12/12/22 Rt 12/16/22 Rt 12/22/22  Hip flexion         Hip extension         Hip abduction         Hip adduction         Hip internal rotation         Hip external rotation         Knee flexion 95 AROM in supine heel slide    A: 110 P: 115 A: P: AROM 115* A: 120 A: 120  Knee extension -8 AROM in seated LAQ   A: -5 P: -2 A: P: -5* AROM A: -2 P: 0 A: -4 P: -2  Ankle dorsiflexion         Ankle plantarflexion         Ankle inversion         Ankle eversion          (Blank rows = not tested)  LOWER EXTREMITY MMT:  MMT Right 11/25/2022 Left 11/25/2022  Hip flexion 4/5 5/5  Hip extension    Hip abduction    Hip adduction    Hip internal rotation    Hip external rotation    Knee flexion 4/5 5/5  Knee extension 4/5 5/5  Ankle dorsiflexion 5/5 5/5  Ankle plantarflexion    Ankle inversion    Ankle eversion      (Blank rows = not tested)  LOWER EXTREMITY SPECIAL TESTS:  11/25/2022 No specific testing.   FUNCTIONAL TESTS:  11/25/2022 18 inch chair transfer: able without hands but with high deviation to Lt leg Lt SLS:  < 3 seconds Rt SLS: unable   TUG c FWW: 12.7 seconds TUG c SPC in Lt UE 14.2 seconds  GAIT: 11/25/2022 Ambulation c FWW within clinic, observed household distances.   Arrived c FWW.  Can perform SPC in Lt UE ambulation.  TODAY'S TREATMENT                                                                          DATE:  12/22/22:  Therex: Recumbent bike: level 4 x 9 minutes Leg Press: Bil LEs 87# 15 reps; Rt LE only 50# 2 x 10  Seated LAQ: 4 # 2 x 10 Seated SLR: x 20 Standing lateral step ups on 6 inch step: x 15 c UE support Standing calf stretch: x 2 holding 30 sec TRX squats 2 x 10  Manual:  PROM: knee flexion and extension with overpressure Modalities:  Vasopneumatic: medium compression, LE elevated, 34 deg         12/16/22:  Therex: Recumbent bike: level 4 x 8 minutes Leg Press: Bil LEs 81# 15 reps; Rt LE only 50# 2 x 10  Seated LAQ: 4 # 2 x 10 Standing lateral step ups on 6 inch step: x 15 c UE support Standing calf stretch: x 2 holding 30 sec TRX squats 2 x 10  Prone: knee hangs x 30 sec NMR:  Tandem walking in parallel bars x 4  Tandem walking on airex beam: x 4 c intermittent UE support Tandem side stepping on airex beam x 4 Modalities:  Vasopneumatic: medium compression, LE elevated, 34 deg      12/12/22  TherEx  Precor bike Seat 4x10 minutes for warmup/ROM Forward step ups 6 inch box x15  Forward step downs 4 inch box x12 HS curls 3# x10 R LE Standing hip ABD R LE 3# x15 Shuttle LE press 81# 2x15, R LE 50# 2x10 HS stretches 3x30 seconds ROM check Encouraged ice  routine at home, also use of compression stockings if edema continues with return to work          PATIENT EDUCATION:  11/25/2022 Education details: HEP, POC Person educated: Patient Education method: Programmer, multimedia, Facilities manager, Verbal cues, and Handouts Education comprehension: verbalized understanding, returned demonstration, and verbal cues required  HOME EXERCISE PROGRAM: Access Code: [facility] URL: [email or web address] Date: 11/25/2022 Prepared by: Chyrel Masson  Exercises - Supine Heel Slide  - 3-5 x daily - 7 x weekly - 1 sets - 10 reps - 5 hold - Supine Quadricep Sets  - 3-5 x daily - 7 x weekly - 1 sets - 10 reps - 5 hold - Supine Knee Extension Mobilization with Weight  - 4-5 x daily - 7 x weekly - 1 sets - 1 reps - to tolerance up to 15 mins hold - Seated Long Arc Quad  - 3-5 x daily - 7 x weekly - 1 sets - 5-10 reps - 2 hold - Seated Quad Set  - 3-5 x daily - 7 x weekly - 1 sets - 10 reps - 5 hold  ASSESSMENT:  CLINICAL IMPRESSION: Pt tolerating all exercises well. Pt's Rt knee flexion is 120 degrees actively and 124 degrees passively. Pt still lacking between 2-4 degrees of extension. Continue to progress toward all goals set.     OBJECTIVE IMPAIRMENTS: Abnormal gait, decreased activity tolerance, decreased balance, decreased coordination, decreased endurance, decreased mobility, difficulty walking, decreased ROM, decreased strength, hypomobility, increased edema, increased fascial restrictions, impaired perceived functional ability, impaired flexibility, improper body mechanics, and pain.  ACTIVITY LIMITATIONS: carrying, lifting, bending, sitting, standing, squatting, sleeping, stairs, transfers, bed mobility, bathing, dressing, and locomotion level  PARTICIPATION LIMITATIONS: meal prep, cleaning, laundry, interpersonal relationship, driving, shopping, and community activity  PERSONAL FACTORS: CAD, fibromyalgia, HTN are also affecting patient's  functional outcome.   REHAB POTENTIAL: Good  CLINICAL DECISION MAKING: Stable/uncomplicated  EVALUATION COMPLEXITY: Low   GOALS: Goals reviewed with patient? Yes  SHORT TERM GOALS: (target date for Short term goals are 3 weeks 12/16/2022)   1.  Patient will demonstrate independent use of home exercise program to maintain progress from in clinic treatments.  Goal status: MET 12/16/22  LONG TERM GOALS: (target dates for all long term goals are 12 weeks  02/17/2023 )   1. Patient will demonstrate/report pain at worst less than or equal to 2/10 to facilitate minimal limitation in daily activity secondary to pain symptoms.  Goal status: New   2. Patient will demonstrate independent use of home exercise program to facilitate ability to maintain/progress functional gains from skilled physical therapy services.  Goal status: New   3. Patient will demonstrate FOTO outcome > or = 74 % to indicate reduced disability due to condition.  Goal status: New   4.  Patient will demonstrate Rt LE MMT 5/5 throughout to faciltiate usual transfers, stairs, squatting at [facility] for daily life.   Goal status: New   5.  Patient will demonstrate Rt knee AROM 0-110 deg to facilitate usual transfers, ambulation, and other daily movement at PLOF.  Goal status:  MET 12/16/22   6.  Patient will demonstrate independent ambulation community distances > 300 ft for community integration.  Goal status: New   7.  Patient will demonstrate ascending/descending stairs reciprocally s UE assist for community integration.   Goal Status: New   PLAN:  PT FREQUENCY: 1-2x/week  PT DURATION: 12 weeks  PLANNED INTERVENTIONS: Therapeutic exercises, Therapeutic activity, Neuro Muscular re-education, Balance training, Gait training, Patient/Family education, Joint mobilization, Stair training, DME instructions, Dry Needling, Electrical stimulation, Traction, Cryotherapy, vasopneumatic deviceMoist heat, Taping, Ultrasound,  Ionotophoresis 4mg /ml Dexamethasone, and aquatic therapy, Manual therapy.  All included unless contraindicated  PLAN FOR NEXT SESSION:  continue with exercises for ROM gains & strengthening, step ups with progressions as tolerated    Narda Amber, PT, MPT 12/22/22 12:27 PM   12/22/22 12:27 PM

## 2022-12-23 ENCOUNTER — Encounter: Payer: Medicare HMO | Admitting: Rehabilitative and Restorative Service Providers"

## 2022-12-25 ENCOUNTER — Encounter: Payer: Medicare HMO | Admitting: Rehabilitative and Restorative Service Providers"

## 2022-12-25 ENCOUNTER — Encounter: Payer: Medicare HMO | Admitting: Physical Therapy

## 2022-12-30 ENCOUNTER — Encounter: Payer: Medicare HMO | Admitting: Rehabilitative and Restorative Service Providers"

## 2022-12-30 ENCOUNTER — Encounter: Payer: Medicare HMO | Admitting: Physical Therapy

## 2023-01-01 ENCOUNTER — Encounter: Payer: Medicare HMO | Admitting: Rehabilitative and Restorative Service Providers"

## 2023-01-02 ENCOUNTER — Encounter: Payer: Medicare HMO | Admitting: Physical Therapy

## 2023-01-03 DIAGNOSIS — H524 Presbyopia: Secondary | ICD-10-CM | POA: Diagnosis not present

## 2023-01-03 DIAGNOSIS — Z0100 Encounter for examination of eyes and vision without abnormal findings: Secondary | ICD-10-CM | POA: Diagnosis not present

## 2023-01-06 ENCOUNTER — Encounter: Payer: Medicare HMO | Admitting: Physical Therapy

## 2023-01-06 ENCOUNTER — Encounter: Payer: Medicare HMO | Admitting: Rehabilitative and Restorative Service Providers"

## 2023-02-05 ENCOUNTER — Other Ambulatory Visit: Payer: Self-pay

## 2023-02-05 MED ORDER — EZETIMIBE 10 MG PO TABS: 10.0000 mg | ORAL_TABLET | Freq: Every day | ORAL | 0 refills | Status: DC

## 2023-03-01 ENCOUNTER — Encounter: Payer: Self-pay | Admitting: Pharmacist

## 2023-03-02 ENCOUNTER — Other Ambulatory Visit (HOSPITAL_COMMUNITY): Payer: Self-pay

## 2023-03-02 ENCOUNTER — Telehealth: Payer: Self-pay | Admitting: Pharmacy Technician

## 2023-03-02 NOTE — Telephone Encounter (Signed)
Pharmacy Patient Advocate Encounter   Received notification from Patient Advice Request messages that prior authorization for repatha is required/requested.   Insurance verification completed.   The patient is insured through [employer] .   Per test claim: The current 03/02/23 day co-pay is, $391.00 - 3 months (DEDUCTIBLE) .  No PA needed at this time. This test claim was processed through [facility]- copay amounts may vary at other pharmacies due to pharmacy/plan contracts, or as the patient moves through the different stages of their insurance plan.   PA renewed 02/10/22- 02/10/24

## 2023-04-06 ENCOUNTER — Telehealth: Payer: Self-pay | Admitting: Pharmacist

## 2023-04-06 MED ORDER — REPATHA SURECLICK 140 MG/ML ~~LOC~~ SOAJ: 1.0000 | SUBCUTANEOUS | 11 refills | Status: AC

## 2023-04-06 NOTE — Telephone Encounter (Signed)
 Spoke with patient. Her husband retired. Looks like she has deductible. Will see if we can get a healthwell grant.

## 2023-04-06 NOTE — Telephone Encounter (Signed)
 Pt c/o medication issue:  1. Name of Medication:   Evolocumab (REPATHA SURECLICK) 140 MG/ML SOAJ   2. How are you currently taking this medication (dosage and times per day)?   As prescribed  3. Are you having a reaction (difficulty breathing--STAT)?   No  4. What is your medication issue?   Patient stated she wants a call back to discuss coupon for this medication and next steps.

## 2023-04-07 ENCOUNTER — Telehealth: Payer: Self-pay

## 2023-04-07 ENCOUNTER — Other Ambulatory Visit (HOSPITAL_COMMUNITY): Payer: Self-pay

## 2023-04-07 NOTE — Telephone Encounter (Signed)
 Patient Advocate Encounter   The patient was approved for a [employer] grant that will help cover the cost of Repatha Total amount awarded, $2,500.  Effective: 03/08/2023 - 02/15/2024   NFA:[id number] VHQ:[id number] [id number]   Pharmacy provided with approval and processing information. Patient informed via Gracy Racer CPhT  Pharmacy Patient Advocate Specialist  Direct Number: [phone]: [phone]

## 2023-04-07 NOTE — Telephone Encounter (Signed)
 Patient enrolled in healthwell. Pharmacy provided with grant info and billing instructions. Patient informed via mychart.

## 2023-04-20 DIAGNOSIS — J019 Acute sinusitis, unspecified: Secondary | ICD-10-CM | POA: Diagnosis not present

## 2023-04-20 DIAGNOSIS — R051 Acute cough: Secondary | ICD-10-CM | POA: Diagnosis not present

## 2023-05-03 ENCOUNTER — Other Ambulatory Visit: Payer: Self-pay | Admitting: Physician Assistant

## 2023-05-13 ENCOUNTER — Other Ambulatory Visit: Payer: Self-pay | Admitting: Physician Assistant

## 2023-05-15 ENCOUNTER — Other Ambulatory Visit (HOSPITAL_COMMUNITY): Payer: Self-pay

## 2023-05-15 ENCOUNTER — Telehealth: Payer: Self-pay | Admitting: Pharmacy Technician

## 2023-05-15 ENCOUNTER — Telehealth: Payer: Self-pay | Admitting: Pharmacist

## 2023-05-15 NOTE — Telephone Encounter (Signed)
 You  Tylene Fantasia, RPHJust now (10:25 AM)    Hi, I tried a test claim for praluent and it rejected saying non-formulary. The repatha rejected just because its too soon (she just got 05/05/23). So I called [employer] asking them about their letter and they said: that repatha is still approved for 28 days and 84 days -auth number is [id number], she said ignore the letter that you got since it is still approved 02/10/2024.   Tylene Fantasia, [facility]  Rx Prior Auth Team2 hours ago (8:15 AM)    Received letter from [facility] - will limit Qty supply to 30 for Repatha I believe their preferred PCSK9i has been changed to Praluent- would you be able to check?

## 2023-05-18 ENCOUNTER — Ambulatory Visit (INDEPENDENT_AMBULATORY_CARE_PROVIDER_SITE_OTHER): Payer: Medicare HMO | Admitting: Physician Assistant

## 2023-05-18 ENCOUNTER — Other Ambulatory Visit (INDEPENDENT_AMBULATORY_CARE_PROVIDER_SITE_OTHER): Payer: Self-pay

## 2023-05-18 ENCOUNTER — Encounter: Payer: Self-pay | Admitting: Physician Assistant

## 2023-05-18 DIAGNOSIS — Z96651 Presence of right artificial knee joint: Secondary | ICD-10-CM

## 2023-05-18 NOTE — Progress Notes (Signed)
 Office Visit Note   Patient: Sierra Lucas           Date of Birth: 12/13/1955           MRN: [id number] Visit Date: 05/18/2023              Requested by: Gweneth Dimitri, MD [address] [place],  [place] [address] PCP: Gweneth Dimitri, MD   Assessment & Plan: Visit Diagnoses:  1. Status post total right knee replacement     Plan: Right knee was prepped with Betadine and then 18 cc of slightly bloody aspirate was obtained.  Will have her work on Dance movement psychotherapist.  Follow-up with Dr. Magnus Ivan in 2 months to see how she is doing overall.  Radiographs reviewed questions encouraged and answered.  Follow-Up Instructions: Return in about 2 months (around 07/18/2023) for Dr. Magnus Ivan.   Orders:  Orders Placed This Encounter  Procedures   XR Knee 1-2 Views Right   No orders of the defined types were placed in this encounter.     Procedures: No procedures performed   Clinical Data: No additional findings.   Subjective: Chief Complaint  Patient presents with   Right Knee - Follow-up    11/11/22 RT TKA    HPI Patient 68 year old female returns status post right total knee arthroplasty 11/11/2022.  She also underwent right greater trochanteric injection on 12/18/2022 which she states was very useful.  Right knee was doing well until about 2 weeks ago when she started having some swelling of the knee.  [place] has [place] the beach since [place].  She walks and rides bike daily.  Walks about 1-1/2-2 and half miles per day.  No known injury.  Pain is aching medial aspect of the knee.  Denies fevers or chills.  Review of Systems  Constitutional:  Negative for chills and fever.     Objective: Vital Signs: There were no vitals taken for this visit.  Physical Exam Constitutional:      Appearance: She is not ill-appearing or diaphoretic.  Pulmonary:     Effort: Pulmonary effort is normal.  Neurological:     Mental Status: She is alert and oriented to person, place, and  time.  Psychiatric:        Mood and Affect: Mood normal.     Ortho Exam Bilateral knees full extension.  Left knee full flexion right knee flexion to approximately 100 510 degrees.  No abnormal warmth erythema of either knee.  Right knee slight effusion.  Well-healed surgical incision right knee.  No instability noted needed valgus varus stressing anterior drawers negative bilaterally.  Specialty Comments:  No specialty comments available.  Imaging: XR Knee 1-2 Views Right Result Date: 05/18/2023 Right knee 2 views: Status post right total knee press-fit knee no evidence of loosening.  No acute fractures acute findings is well located.    PMFS History: Patient Active Problem List   Diagnosis Date Noted   OA (osteoarthritis) of knee 11/11/2022   Status post total right knee replacement 11/11/2022   Pain in left ankle and joints of left foot 07/05/2021   Knee pain 08/10/2017   Gastrointestinal hemorrhage with melena 01/07/2017   Acute blood loss anemia 01/07/2017   CAD (coronary artery disease) of artery bypass graft 10/11/2013   Systemic hypertension 10/11/2013   Hyperlipidemia 10/11/2013   Hidradenitis suppurativa 10/06/2011   Menopause 07/30/2010   Wears glasses 07/30/2010   Family history of breast cancer 07/30/2010   Past Medical History:  Diagnosis Date   Abnormal Pap smear 06/19/2005   ASCUS    Anemia    iron deficiency   Anxiety    Coronary artery disease    Fibromyalgia    GI (gastrointestinal bleed) 01/08/2017   Headache(784.0)    Heart murmur    since birth   HGSIL (high grade squamous intraepithelial dysplasia) 2005   Osteoarthritis of right knee    Systemic hypertension 10/11/2013   PATIENT DENIES    Family History  Problem Relation Age of Onset   Heart disease Father    Breast cancer Maternal Grandmother     Past Surgical History:  Procedure Laterality Date   ABDOMINOPLASTY     after breast reduction   BLADDER SURGERY  02/10/2002   bladder  sling   CARDIAC SURGERY  11/19/1998   CABG: LIMA-LAD   CHOLECYSTECTOMY  1981   CYSTECTOMY  06/11/2010   REMOVED ON BUTTOCKS    ESOPHAGOGASTRODUODENOSCOPY (EGD) WITH PROPOFOL Left 01/08/2017   Procedure: ESOPHAGOGASTRODUODENOSCOPY (EGD) WITH PROPOFOL;  Surgeon: Kerin Salen, MD;  Location: [facility] ENDOSCOPY;  Service: Gastroenterology;  Laterality: Left;   GASTRIC BYPASS  06/24/2012   INGUINAL HIDRADENITIS EXCISION  02/11/2011   NOVASURE ABLATION  07/18/2005   REDUCTION MAMMAPLASTY Bilateral    TOTAL KNEE ARTHROPLASTY Right 11/11/2022   Procedure: RIGHT TOTAL KNEE ARTHROPLASTY;  Surgeon: Kathryne Hitch, MD;  Location: [facility] OR;  Service: Orthopedics;  Laterality: Right;   TUBAL LIGATION     Social History   Occupational History   Not on file  Tobacco Use   Smoking status: Never   Smokeless tobacco: Never  Vaping Use   Vaping status: Never Used  Substance and Sexual Activity   Alcohol use: Yes    Comment: two drinks per month.   Drug use: No   Sexual activity: Yes    Birth control/protection: Surgical    Comment: BTL/ABLATION

## 2023-06-02 NOTE — Telephone Encounter (Signed)
 Nereida Banning, CPhT  You2 weeks ago    Hi, I tried a test claim for praluent and it rejected saying non-formulary. The repatha  rejected just because its too soon (she just got 05/05/23). So I called [employer] asking them about their letter and they said: that repatha  is still approved for 28 days and 84 days -auth number is [id number], she said ignore the letter that you got since it is still approved 02/10/2024.

## 2023-06-04 IMAGING — CT CT ANGIO CHEST
2 of 8 series · 16 of 46 positions shown · IV contrast (OMNIPAQUE 350)
Comparison: None.

CLINICAL DATA: 65-year-old female with a family history of aortic
aneurysm.

EXAM:
CT ANGIOGRAPHY CHEST WITH CONTRAST
TECHNIQUE: Multidetector CT imaging of the chest was performed using the
standard protocol during bolus administration of intravenous
contrast. Multiplanar CT image reconstructions and MIPs were
obtained to evaluate the vascular anatomy.

[Series 4: aorta 3.0 bf37 2 · axial · 0.77mm/px · z∈[-320,-78]mm · 13 of 95 slices shown]
[im 7/95  lung]
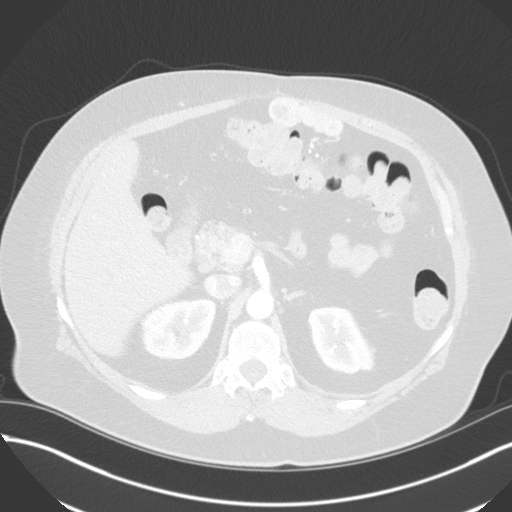
[im 14/95  soft-tissue]
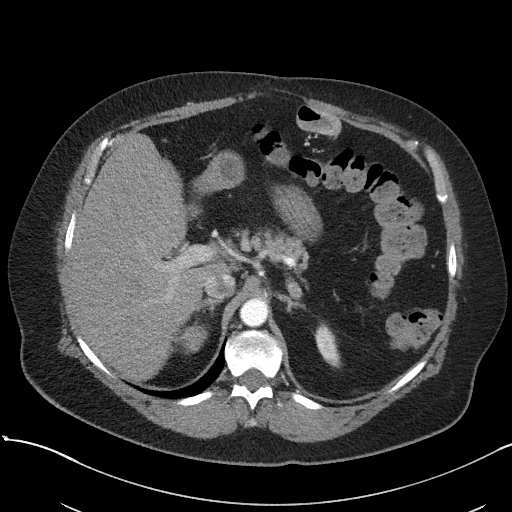
[im 21/95  lung]
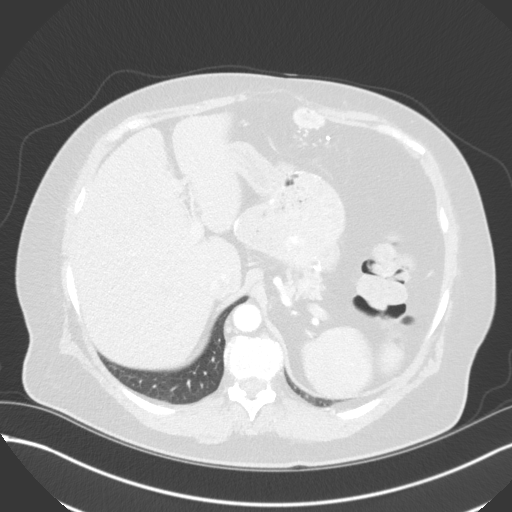
[im 27/95  soft-tissue]
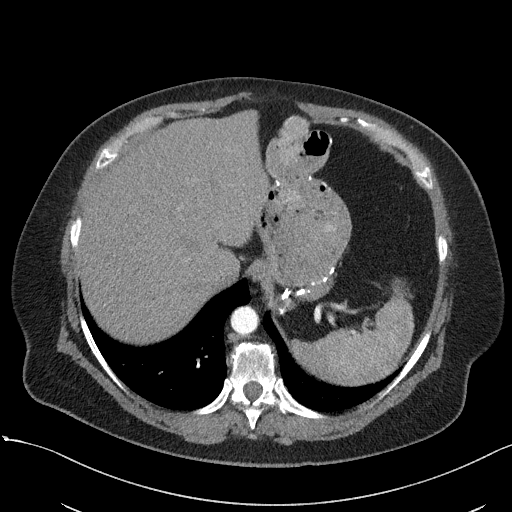
[im 34/95  lung]
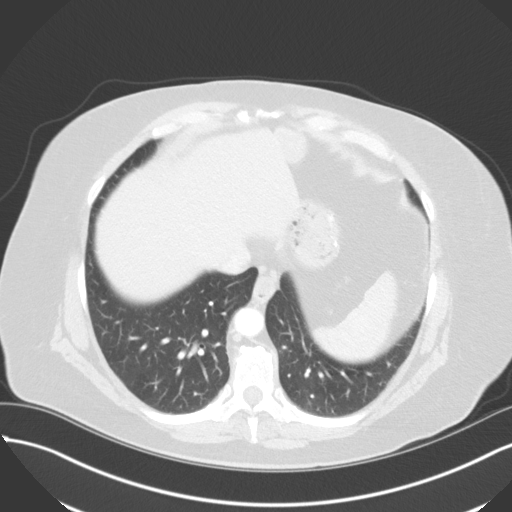
[im 41/95  soft-tissue]
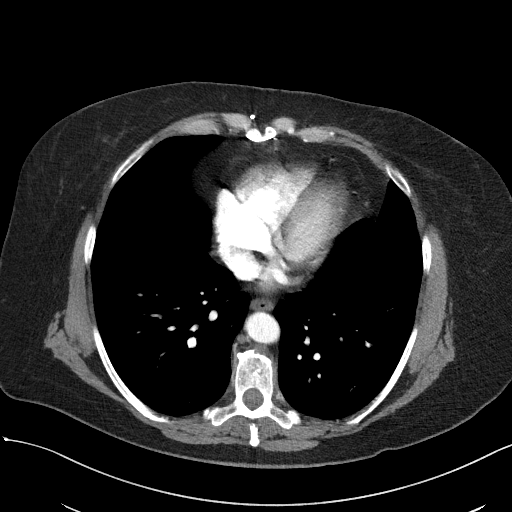
[im 48/95  lung]
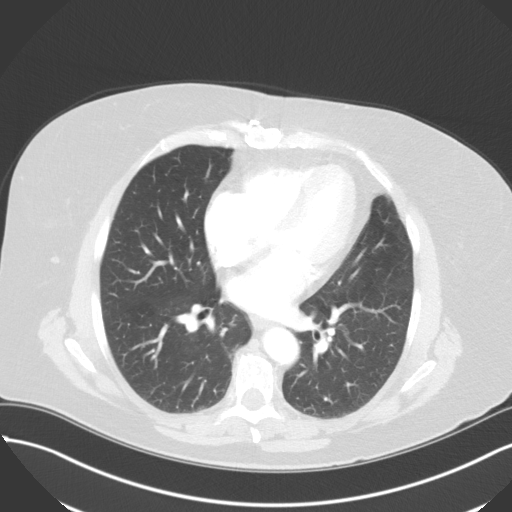
[im 54/95  soft-tissue]
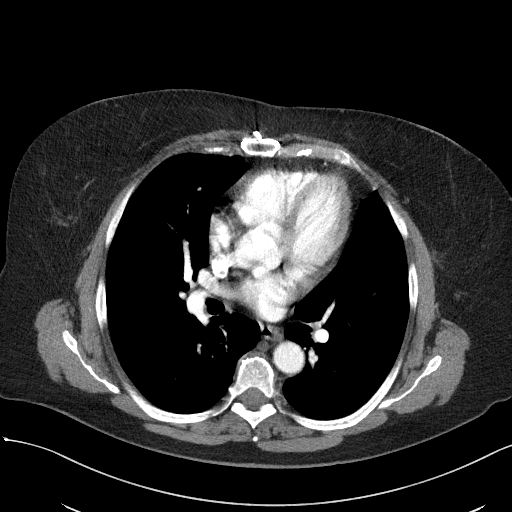
[im 61/95  lung]
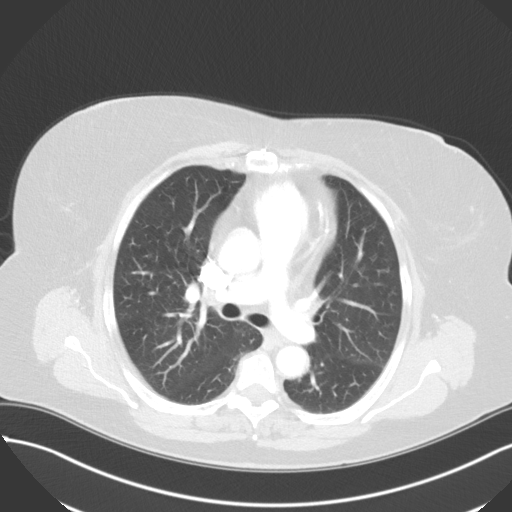
[im 68/95  soft-tissue]
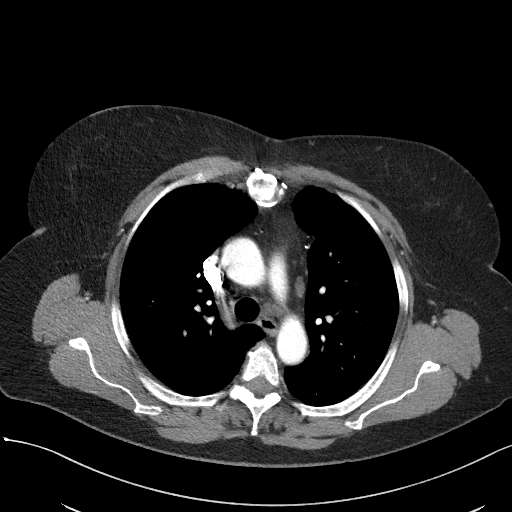
[im 74/95  lung]
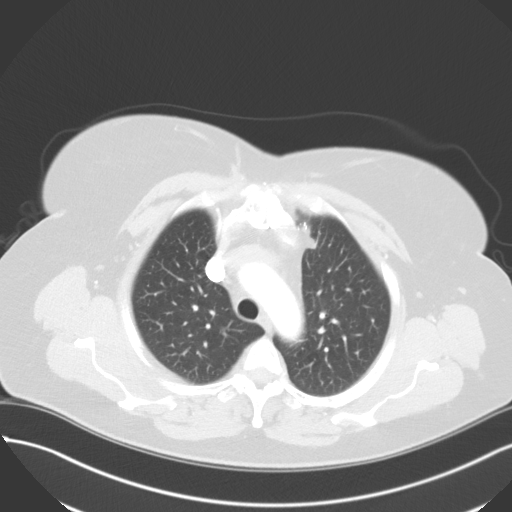
[im 81/95  soft-tissue]
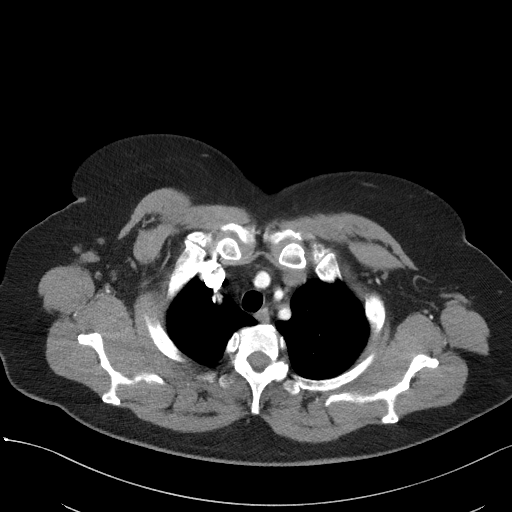
[im 88/95  lung]
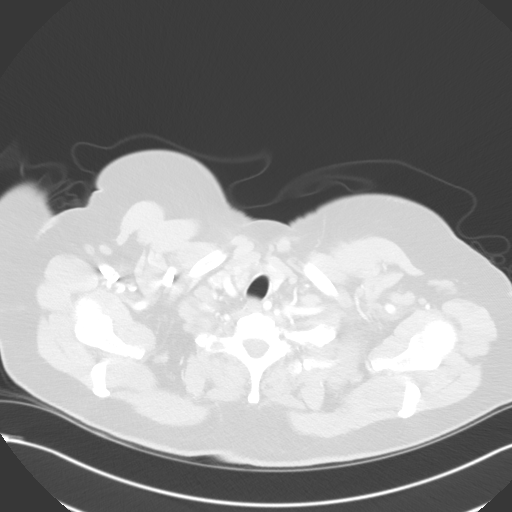

[Series 7: coronals · coronal · 0.55mm/px · 3 of 138 slices shown]
[im 35/138  soft-tissue]
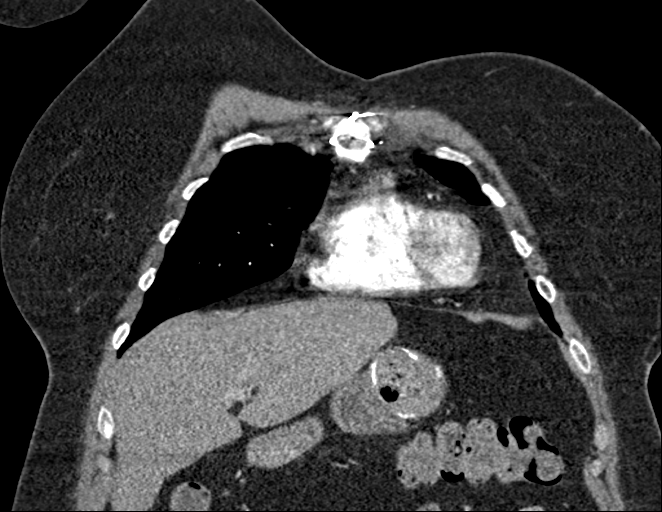
[im 69/138  soft-tissue]
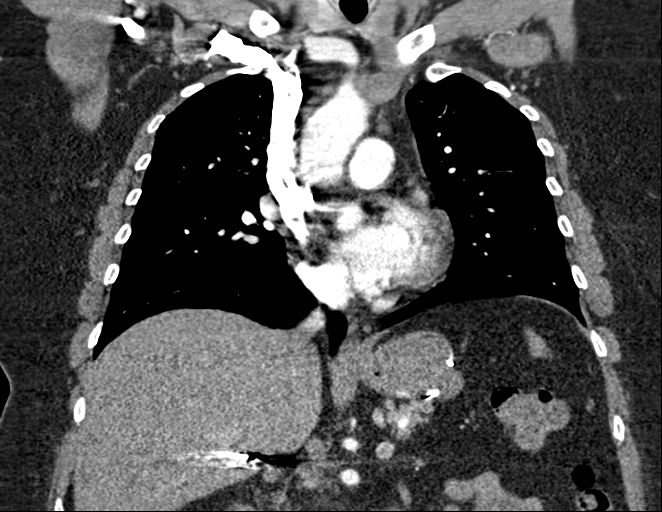
[im 103/138  soft-tissue]
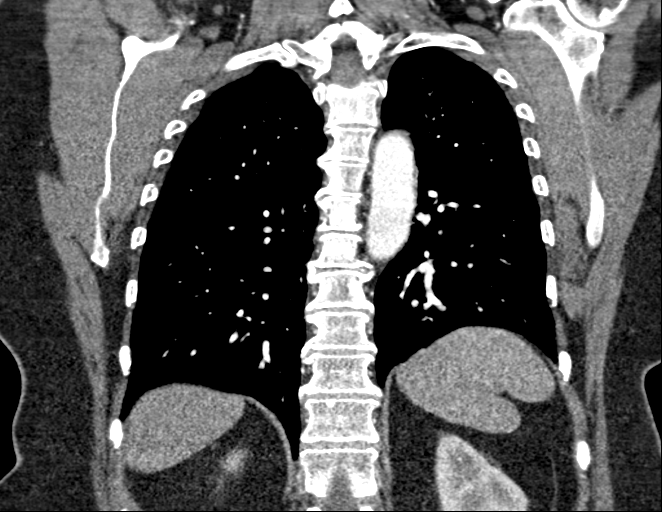

[16 of 46 positions shown; findings below may reference images not displayed]

RADIATION DOSE REDUCTION: This exam was performed according to the
departmental dose-optimization program which includes automated
exposure control, adjustment of the mA and/or kV according to
patient size and/or use of iterative reconstruction technique.

CONTRAST:  100mL OMNIPAQUE IOHEXOL 350 MG/ML SOLN
FINDINGS: Cardiovascular: Slightly limited evaluation of the aortic root and
ascending thoracic aorta secondary to significant cardiac motion
related artifact. However, the aortic root, ascending, transverse
and descending thoracic aorta are all normal in caliber. There is
tortuosity in the descending thoracic aorta. Mild scattered
atherosclerotic plaque. Conventional 3 vessel arch anatomy. Patient
is status post median sternotomy with evidence of multivessel CABG.
The main pulmonary artery is normal in caliber. No evidence of PE.
The heart is normal in size. No pericardial effusion.

Mediastinum/Nodes: Unremarkable CT appearance of the thyroid gland.
No suspicious mediastinal or hilar adenopathy. No soft tissue
mediastinal mass. The thoracic esophagus is unremarkable.

Lungs/Pleura: 4 mm peripheral subpleural nodule in the right upper
lobe (image 37 series 5). Tiny 2 mm subpleural pulmonary nodule in
the periphery of the right lower lobe (image 63 series 5. The lungs
are otherwise clear. No pleural effusion or pneumothorax.

Upper Abdomen: Surgical changes of prior gastric bypass procedure.
The gallbladder is surgically absent. No acute abnormality within
the visualized upper abdomen.

Musculoskeletal: Surgical changes of prior median sternotomy.
Chronic nonunion of manubrium. No acute fracture, malalignment or
bony lesion.

Review of the MIP images confirms the above findings.
IMPRESSION: 1. No evidence of thoracic aortic aneurysm.
2. Aortic and native coronary artery atherosclerotic calcifications.
Evidence of prior CABG.
3. Small pulmonary nodules measuring 2 and 4 mm in the periphery of
the right upper and lower lobes. No follow-up needed if patient is
low-risk (and has no known or suspected primary neoplasm).
Non-contrast chest CT can be considered in 12 months if patient is
high-risk. This recommendation follows the consensus statement:
Guidelines for Management of Incidental Pulmonary Nodules Detected
on CT Images:From the [HOSPITAL] 8678; published online
before print (10.1148/radiol.[id number]).
4. Surgical changes of gastric bypass procedure.

Aortic Atherosclerosis (F6VCO-DER.R).

## 2023-06-16 DIAGNOSIS — Z79899 Other long term (current) drug therapy: Secondary | ICD-10-CM | POA: Diagnosis not present

## 2023-06-16 DIAGNOSIS — Z951 Presence of aortocoronary bypass graft: Secondary | ICD-10-CM | POA: Diagnosis not present

## 2023-06-16 DIAGNOSIS — F419 Anxiety disorder, unspecified: Secondary | ICD-10-CM | POA: Diagnosis not present

## 2023-06-16 DIAGNOSIS — Z13820 Encounter for screening for osteoporosis: Secondary | ICD-10-CM | POA: Diagnosis not present

## 2023-06-16 DIAGNOSIS — F410 Panic disorder [episodic paroxysmal anxiety] without agoraphobia: Secondary | ICD-10-CM | POA: Diagnosis not present

## 2023-06-16 DIAGNOSIS — I10 Essential (primary) hypertension: Secondary | ICD-10-CM | POA: Diagnosis not present

## 2023-06-16 DIAGNOSIS — F330 Major depressive disorder, recurrent, mild: Secondary | ICD-10-CM | POA: Diagnosis not present

## 2023-06-16 DIAGNOSIS — Z1231 Encounter for screening mammogram for malignant neoplasm of breast: Secondary | ICD-10-CM | POA: Diagnosis not present

## 2023-06-16 DIAGNOSIS — Z7689 Persons encountering health services in other specified circumstances: Secondary | ICD-10-CM | POA: Diagnosis not present

## 2023-06-16 DIAGNOSIS — I2510 Atherosclerotic heart disease of native coronary artery without angina pectoris: Secondary | ICD-10-CM | POA: Diagnosis not present

## 2023-06-18 DIAGNOSIS — Z780 Asymptomatic menopausal state: Secondary | ICD-10-CM | POA: Diagnosis not present

## 2023-06-18 DIAGNOSIS — M85851 Other specified disorders of bone density and structure, right thigh: Secondary | ICD-10-CM | POA: Diagnosis not present

## 2023-06-18 DIAGNOSIS — Z1231 Encounter for screening mammogram for malignant neoplasm of breast: Secondary | ICD-10-CM | POA: Diagnosis not present

## 2023-06-18 DIAGNOSIS — Z13820 Encounter for screening for osteoporosis: Secondary | ICD-10-CM | POA: Diagnosis not present

## 2023-06-24 DIAGNOSIS — R928 Other abnormal and inconclusive findings on diagnostic imaging of breast: Secondary | ICD-10-CM | POA: Diagnosis not present

## 2023-06-24 DIAGNOSIS — N6320 Unspecified lump in the left breast, unspecified quadrant: Secondary | ICD-10-CM | POA: Diagnosis not present

## 2023-07-02 DIAGNOSIS — I10 Essential (primary) hypertension: Secondary | ICD-10-CM | POA: Diagnosis not present

## 2023-07-02 DIAGNOSIS — Z0000 Encounter for general adult medical examination without abnormal findings: Secondary | ICD-10-CM | POA: Diagnosis not present

## 2023-07-10 DIAGNOSIS — Z7189 Other specified counseling: Secondary | ICD-10-CM | POA: Diagnosis not present

## 2023-07-10 DIAGNOSIS — I10 Essential (primary) hypertension: Secondary | ICD-10-CM | POA: Diagnosis not present

## 2023-07-10 DIAGNOSIS — F330 Major depressive disorder, recurrent, mild: Secondary | ICD-10-CM | POA: Diagnosis not present

## 2023-07-10 DIAGNOSIS — E559 Vitamin D deficiency, unspecified: Secondary | ICD-10-CM | POA: Diagnosis not present

## 2023-07-10 DIAGNOSIS — Z139 Encounter for screening, unspecified: Secondary | ICD-10-CM | POA: Diagnosis not present

## 2023-07-10 DIAGNOSIS — I2510 Atherosclerotic heart disease of native coronary artery without angina pectoris: Secondary | ICD-10-CM | POA: Diagnosis not present

## 2023-07-10 DIAGNOSIS — F419 Anxiety disorder, unspecified: Secondary | ICD-10-CM | POA: Diagnosis not present

## 2023-07-10 DIAGNOSIS — Z0000 Encounter for general adult medical examination without abnormal findings: Secondary | ICD-10-CM | POA: Diagnosis not present

## 2023-07-10 DIAGNOSIS — Z8659 Personal history of other mental and behavioral disorders: Secondary | ICD-10-CM | POA: Diagnosis not present

## 2023-07-13 DIAGNOSIS — F419 Anxiety disorder, unspecified: Secondary | ICD-10-CM | POA: Diagnosis not present

## 2023-07-13 DIAGNOSIS — F5101 Primary insomnia: Secondary | ICD-10-CM | POA: Diagnosis not present

## 2023-07-13 DIAGNOSIS — I2510 Atherosclerotic heart disease of native coronary artery without angina pectoris: Secondary | ICD-10-CM | POA: Diagnosis not present

## 2023-07-13 DIAGNOSIS — I10 Essential (primary) hypertension: Secondary | ICD-10-CM | POA: Diagnosis not present

## 2023-07-13 DIAGNOSIS — E559 Vitamin D deficiency, unspecified: Secondary | ICD-10-CM | POA: Diagnosis not present

## 2023-07-13 DIAGNOSIS — F330 Major depressive disorder, recurrent, mild: Secondary | ICD-10-CM | POA: Diagnosis not present

## 2023-07-20 ENCOUNTER — Ambulatory Visit: Admitting: Orthopaedic Surgery

## 2023-07-23 DIAGNOSIS — N390 Urinary tract infection, site not specified: Secondary | ICD-10-CM | POA: Diagnosis not present

## 2023-07-23 DIAGNOSIS — L089 Local infection of the skin and subcutaneous tissue, unspecified: Secondary | ICD-10-CM | POA: Diagnosis not present

## 2023-08-10 DIAGNOSIS — E7800 Pure hypercholesterolemia, unspecified: Secondary | ICD-10-CM | POA: Diagnosis not present

## 2023-08-10 DIAGNOSIS — Z951 Presence of aortocoronary bypass graft: Secondary | ICD-10-CM | POA: Diagnosis not present

## 2023-08-10 DIAGNOSIS — I2583 Coronary atherosclerosis due to lipid rich plaque: Secondary | ICD-10-CM | POA: Diagnosis not present

## 2023-08-10 DIAGNOSIS — I10 Essential (primary) hypertension: Secondary | ICD-10-CM | POA: Diagnosis not present

## 2023-08-10 DIAGNOSIS — I491 Atrial premature depolarization: Secondary | ICD-10-CM | POA: Diagnosis not present

## 2023-08-10 DIAGNOSIS — I2510 Atherosclerotic heart disease of native coronary artery without angina pectoris: Secondary | ICD-10-CM | POA: Diagnosis not present

## 2023-08-10 DIAGNOSIS — Z789 Other specified health status: Secondary | ICD-10-CM | POA: Diagnosis not present

## 2023-08-21 DIAGNOSIS — F5101 Primary insomnia: Secondary | ICD-10-CM | POA: Diagnosis not present

## 2023-08-21 DIAGNOSIS — E559 Vitamin D deficiency, unspecified: Secondary | ICD-10-CM | POA: Diagnosis not present

## 2023-08-21 DIAGNOSIS — F330 Major depressive disorder, recurrent, mild: Secondary | ICD-10-CM | POA: Diagnosis not present

## 2023-08-21 DIAGNOSIS — I10 Essential (primary) hypertension: Secondary | ICD-10-CM | POA: Diagnosis not present

## 2023-08-21 DIAGNOSIS — I2510 Atherosclerotic heart disease of native coronary artery without angina pectoris: Secondary | ICD-10-CM | POA: Diagnosis not present

## 2023-08-21 DIAGNOSIS — F419 Anxiety disorder, unspecified: Secondary | ICD-10-CM | POA: Diagnosis not present

## 2023-09-11 DIAGNOSIS — F419 Anxiety disorder, unspecified: Secondary | ICD-10-CM | POA: Diagnosis not present

## 2023-09-11 DIAGNOSIS — E559 Vitamin D deficiency, unspecified: Secondary | ICD-10-CM | POA: Diagnosis not present

## 2023-09-11 DIAGNOSIS — F5101 Primary insomnia: Secondary | ICD-10-CM | POA: Diagnosis not present

## 2023-09-11 DIAGNOSIS — I10 Essential (primary) hypertension: Secondary | ICD-10-CM | POA: Diagnosis not present

## 2023-09-11 DIAGNOSIS — I2510 Atherosclerotic heart disease of native coronary artery without angina pectoris: Secondary | ICD-10-CM | POA: Diagnosis not present

## 2023-09-11 DIAGNOSIS — F330 Major depressive disorder, recurrent, mild: Secondary | ICD-10-CM | POA: Diagnosis not present

## 2023-10-21 DIAGNOSIS — L400 Psoriasis vulgaris: Secondary | ICD-10-CM | POA: Diagnosis not present

## 2023-11-06 DIAGNOSIS — F330 Major depressive disorder, recurrent, mild: Secondary | ICD-10-CM | POA: Diagnosis not present

## 2023-11-06 DIAGNOSIS — F419 Anxiety disorder, unspecified: Secondary | ICD-10-CM | POA: Diagnosis not present

## 2023-11-06 DIAGNOSIS — E559 Vitamin D deficiency, unspecified: Secondary | ICD-10-CM | POA: Diagnosis not present

## 2023-11-06 DIAGNOSIS — F5101 Primary insomnia: Secondary | ICD-10-CM | POA: Diagnosis not present

## 2023-11-06 DIAGNOSIS — I2510 Atherosclerotic heart disease of native coronary artery without angina pectoris: Secondary | ICD-10-CM | POA: Diagnosis not present

## 2023-11-06 DIAGNOSIS — I10 Essential (primary) hypertension: Secondary | ICD-10-CM | POA: Diagnosis not present

## 2023-11-23 DIAGNOSIS — Z135 Encounter for screening for eye and ear disorders: Secondary | ICD-10-CM | POA: Diagnosis not present

## 2023-11-23 DIAGNOSIS — H524 Presbyopia: Secondary | ICD-10-CM | POA: Diagnosis not present

## 2023-12-10 DIAGNOSIS — I10 Essential (primary) hypertension: Secondary | ICD-10-CM | POA: Diagnosis not present

## 2023-12-10 DIAGNOSIS — E559 Vitamin D deficiency, unspecified: Secondary | ICD-10-CM | POA: Diagnosis not present

## 2023-12-10 DIAGNOSIS — F5101 Primary insomnia: Secondary | ICD-10-CM | POA: Diagnosis not present

## 2023-12-10 DIAGNOSIS — F330 Major depressive disorder, recurrent, mild: Secondary | ICD-10-CM | POA: Diagnosis not present

## 2023-12-10 DIAGNOSIS — F419 Anxiety disorder, unspecified: Secondary | ICD-10-CM | POA: Diagnosis not present

## 2023-12-10 DIAGNOSIS — I2510 Atherosclerotic heart disease of native coronary artery without angina pectoris: Secondary | ICD-10-CM | POA: Diagnosis not present

## 2023-12-14 ENCOUNTER — Encounter: Payer: Self-pay | Admitting: Radiology

## 2023-12-26 DIAGNOSIS — S0501XA Injury of conjunctiva and corneal abrasion without foreign body, right eye, initial encounter: Secondary | ICD-10-CM | POA: Diagnosis not present

## 2024-01-04 DIAGNOSIS — F419 Anxiety disorder, unspecified: Secondary | ICD-10-CM | POA: Diagnosis not present

## 2024-01-04 DIAGNOSIS — F5101 Primary insomnia: Secondary | ICD-10-CM | POA: Diagnosis not present

## 2024-01-04 DIAGNOSIS — E559 Vitamin D deficiency, unspecified: Secondary | ICD-10-CM | POA: Diagnosis not present

## 2024-01-04 DIAGNOSIS — I10 Essential (primary) hypertension: Secondary | ICD-10-CM | POA: Diagnosis not present

## 2024-01-04 DIAGNOSIS — F330 Major depressive disorder, recurrent, mild: Secondary | ICD-10-CM | POA: Diagnosis not present

## 2024-01-04 DIAGNOSIS — I2510 Atherosclerotic heart disease of native coronary artery without angina pectoris: Secondary | ICD-10-CM | POA: Diagnosis not present

## 2024-01-11 DIAGNOSIS — E559 Vitamin D deficiency, unspecified: Secondary | ICD-10-CM | POA: Diagnosis not present

## 2024-01-11 DIAGNOSIS — I10 Essential (primary) hypertension: Secondary | ICD-10-CM | POA: Diagnosis not present

## 2024-01-15 DIAGNOSIS — Z0000 Encounter for general adult medical examination without abnormal findings: Secondary | ICD-10-CM | POA: Diagnosis not present

## 2024-01-15 DIAGNOSIS — F419 Anxiety disorder, unspecified: Secondary | ICD-10-CM | POA: Diagnosis not present

## 2024-01-15 DIAGNOSIS — F5101 Primary insomnia: Secondary | ICD-10-CM | POA: Diagnosis not present

## 2024-01-15 DIAGNOSIS — Z8659 Personal history of other mental and behavioral disorders: Secondary | ICD-10-CM | POA: Diagnosis not present

## 2024-01-15 DIAGNOSIS — E559 Vitamin D deficiency, unspecified: Secondary | ICD-10-CM | POA: Diagnosis not present

## 2024-01-15 DIAGNOSIS — E663 Overweight: Secondary | ICD-10-CM | POA: Diagnosis not present

## 2024-01-15 DIAGNOSIS — I10 Essential (primary) hypertension: Secondary | ICD-10-CM | POA: Diagnosis not present

## 2024-01-15 DIAGNOSIS — F330 Major depressive disorder, recurrent, mild: Secondary | ICD-10-CM | POA: Diagnosis not present

## 2024-01-15 DIAGNOSIS — Z951 Presence of aortocoronary bypass graft: Secondary | ICD-10-CM | POA: Diagnosis not present
